# Patient Record
Sex: Female | Born: 1960
Health system: Southern US, Community
[De-identification: ages and names within clinical notes are randomized; demographics above are authoritative.]

## PROBLEM LIST (undated history)

## (undated) DIAGNOSIS — D751 Secondary polycythemia: Secondary | ICD-10-CM

## (undated) DIAGNOSIS — M1990 Unspecified osteoarthritis, unspecified site: Secondary | ICD-10-CM

## (undated) DIAGNOSIS — N3281 Overactive bladder: Secondary | ICD-10-CM

## (undated) DIAGNOSIS — D649 Anemia, unspecified: Secondary | ICD-10-CM

## (undated) DIAGNOSIS — Z8719 Personal history of other diseases of the digestive system: Secondary | ICD-10-CM

## (undated) DIAGNOSIS — K219 Gastro-esophageal reflux disease without esophagitis: Secondary | ICD-10-CM

## (undated) DIAGNOSIS — N289 Disorder of kidney and ureter, unspecified: Secondary | ICD-10-CM

## (undated) DIAGNOSIS — E119 Type 2 diabetes mellitus without complications: Secondary | ICD-10-CM

## (undated) DIAGNOSIS — Z87442 Personal history of urinary calculi: Secondary | ICD-10-CM

## (undated) DIAGNOSIS — I10 Essential (primary) hypertension: Secondary | ICD-10-CM

## (undated) DIAGNOSIS — R454 Irritability and anger: Secondary | ICD-10-CM

## (undated) DIAGNOSIS — J449 Chronic obstructive pulmonary disease, unspecified: Secondary | ICD-10-CM

## (undated) DIAGNOSIS — K759 Inflammatory liver disease, unspecified: Secondary | ICD-10-CM

## (undated) HISTORY — DX: Overactive bladder: N32.81

## (undated) HISTORY — PX: GALLBLADDER SURGERY: SHX652

## (undated) HISTORY — PX: CHOLECYSTECTOMY: SHX55

## (undated) HISTORY — DX: Essential (primary) hypertension: I10

## (undated) HISTORY — DX: Type 2 diabetes mellitus without complications: E11.9

---

## 2013-01-15 DIAGNOSIS — D751 Secondary polycythemia: Secondary | ICD-10-CM | POA: Insufficient documentation

## 2013-01-17 ENCOUNTER — Ambulatory Visit: Payer: Self-pay | Admitting: Hematology and Oncology

## 2013-02-12 ENCOUNTER — Ambulatory Visit: Payer: Self-pay | Admitting: Urgent Care

## 2013-03-02 ENCOUNTER — Ambulatory Visit: Payer: Self-pay | Admitting: Internal Medicine

## 2013-03-02 ENCOUNTER — Ambulatory Visit: Payer: Self-pay | Admitting: Hematology and Oncology

## 2013-03-02 LAB — CBC CANCER CENTER
Eosinophil #: 0.3 x10 3/mm (ref 0.0–0.7)
Eosinophil %: 2.7 %
HGB: 15.6 g/dL (ref 12.0–16.0)
Lymphocyte #: 2.6 x10 3/mm (ref 1.0–3.6)
MCH: 30.9 pg (ref 26.0–34.0)
MCHC: 34.5 g/dL (ref 32.0–36.0)
Monocyte %: 7.5 %
Neutrophil #: 5.8 x10 3/mm (ref 1.4–6.5)
Platelet: 281 x10 3/mm (ref 150–440)
RBC: 5.05 10*6/uL (ref 3.80–5.20)
RDW: 12.8 % (ref 11.5–14.5)
WBC: 9.4 x10 3/mm (ref 3.6–11.0)

## 2013-03-02 LAB — IRON AND TIBC
Iron Bind.Cap.(Total): 379 ug/dL (ref 250–450)
Unbound Iron-Bind.Cap.: 275 ug/dL

## 2013-03-02 LAB — FERRITIN: Ferritin (ARMC): 294 ng/mL (ref 8–388)

## 2013-03-12 LAB — CBC CANCER CENTER
Basophil #: 0.1 x10 3/mm (ref 0.0–0.1)
Eosinophil #: 0.3 x10 3/mm (ref 0.0–0.7)
HCT: 46.8 % (ref 35.0–47.0)
Lymphocyte %: 28 %
MCH: 31.3 pg (ref 26.0–34.0)
MCV: 90 fL (ref 80–100)
Monocyte #: 1.1 x10 3/mm — ABNORMAL HIGH (ref 0.2–0.9)
Monocyte %: 8.8 %
Neutrophil %: 60.4 %
WBC: 13 x10 3/mm — ABNORMAL HIGH (ref 3.6–11.0)

## 2013-03-19 ENCOUNTER — Ambulatory Visit: Payer: Self-pay | Admitting: Gastroenterology

## 2013-03-19 ENCOUNTER — Other Ambulatory Visit: Payer: Self-pay | Admitting: Family Medicine

## 2013-03-19 LAB — HEPATIC FUNCTION PANEL A (ARMC)
Bilirubin, Direct: 0.1 mg/dL (ref 0.00–0.20)
Bilirubin,Total: 0.3 mg/dL (ref 0.2–1.0)
SGPT (ALT): 55 U/L (ref 12–78)
Total Protein: 7.5 g/dL (ref 6.4–8.2)

## 2013-03-19 LAB — BASIC METABOLIC PANEL
Anion Gap: 4 — ABNORMAL LOW (ref 7–16)
BUN: 16 mg/dL (ref 7–18)
Calcium, Total: 8.8 mg/dL (ref 8.5–10.1)
Chloride: 102 mmol/L (ref 98–107)
Co2: 29 mmol/L (ref 21–32)
Creatinine: 0.63 mg/dL (ref 0.60–1.30)
EGFR (Non-African Amer.): >60
Osmolality: 279 (ref 275–301)
Potassium: 4.1 mmol/L (ref 3.5–5.1)
Sodium: 135 mmol/L — ABNORMAL LOW (ref 136–145)

## 2013-03-19 LAB — CANCER CENTER HEMATOCRIT: HCT: 42.5 % (ref 35.0–47.0)

## 2013-03-26 LAB — CANCER CENTER HEMATOCRIT: HCT: 41.7 % (ref 35.0–47.0)

## 2013-03-28 ENCOUNTER — Ambulatory Visit: Payer: Self-pay | Admitting: Internal Medicine

## 2013-03-28 ENCOUNTER — Ambulatory Visit: Payer: Self-pay | Admitting: Hematology and Oncology

## 2013-04-09 LAB — CANCER CENTER HEMATOCRIT: HCT: 45.3 % (ref 35.0–47.0)

## 2013-04-16 LAB — CBC CANCER CENTER
Basophil #: 0.1 x10 3/mm (ref 0.0–0.1)
Eosinophil #: 0.3 x10 3/mm (ref 0.0–0.7)
HGB: 15.5 g/dL (ref 12.0–16.0)
Lymphocyte #: 3.3 x10 3/mm (ref 1.0–3.6)
Lymphocyte %: 29.6 %
MCH: 31.2 pg (ref 26.0–34.0)
MCHC: 34.2 g/dL (ref 32.0–36.0)
MCV: 91 fL (ref 80–100)
Monocyte %: 5.8 %
Neutrophil #: 6.9 x10 3/mm — ABNORMAL HIGH (ref 1.4–6.5)
Neutrophil %: 61.3 %
RBC: 4.97 10*6/uL (ref 3.80–5.20)
WBC: 11.2 x10 3/mm — ABNORMAL HIGH (ref 3.6–11.0)

## 2013-04-28 ENCOUNTER — Ambulatory Visit: Payer: Self-pay | Admitting: Hematology and Oncology

## 2013-04-28 ENCOUNTER — Ambulatory Visit: Payer: Self-pay | Admitting: Family Medicine

## 2013-04-28 ENCOUNTER — Ambulatory Visit: Payer: Self-pay | Admitting: Internal Medicine

## 2013-05-07 LAB — CANCER CENTER HEMATOCRIT: HCT: 43 % (ref 35.0–47.0)

## 2013-05-28 ENCOUNTER — Ambulatory Visit: Payer: Self-pay | Admitting: Hematology and Oncology

## 2013-06-06 ENCOUNTER — Ambulatory Visit: Payer: Self-pay | Admitting: Gastroenterology

## 2013-06-06 LAB — HEPATIC FUNCTION PANEL A (ARMC)
Albumin: 3.1 g/dL — ABNORMAL LOW (ref 3.4–5.0)
Bilirubin,Total: 0.3 mg/dL (ref 0.2–1.0)
SGOT(AST): 19 U/L (ref 15–37)

## 2013-07-10 ENCOUNTER — Emergency Department: Payer: Self-pay | Admitting: Emergency Medicine

## 2013-07-16 ENCOUNTER — Ambulatory Visit: Payer: Self-pay | Admitting: Hematology and Oncology

## 2013-07-16 LAB — CANCER CENTER HEMATOCRIT: HCT: 45.3 % (ref 35.0–47.0)

## 2013-07-29 ENCOUNTER — Ambulatory Visit: Payer: Self-pay | Admitting: Hematology and Oncology

## 2013-07-29 ENCOUNTER — Ambulatory Visit: Payer: Self-pay | Admitting: Internal Medicine

## 2013-08-20 LAB — CBC CANCER CENTER
Basophil #: 0.1 x10 3/mm (ref 0.0–0.1)
Basophil %: 0.5 %
EOS ABS: 0.4 x10 3/mm (ref 0.0–0.7)
Eosinophil %: 3.3 %
HCT: 42.5 % (ref 35.0–47.0)
HGB: 13.9 g/dL (ref 12.0–16.0)
Lymphocyte #: 2.5 x10 3/mm (ref 1.0–3.6)
Lymphocyte %: 21.8 %
MCH: 29 pg (ref 26.0–34.0)
MCHC: 32.8 g/dL (ref 32.0–36.0)
MCV: 89 fL (ref 80–100)
MONO ABS: 0.8 x10 3/mm (ref 0.2–0.9)
Monocyte %: 7 %
NEUTROS ABS: 7.6 x10 3/mm — AB (ref 1.4–6.5)
NEUTROS PCT: 67.4 %
PLATELETS: 351 x10 3/mm (ref 150–440)
RBC: 4.8 10*6/uL (ref 3.80–5.20)
RDW: 13.4 % (ref 11.5–14.5)
WBC: 11.3 x10 3/mm — ABNORMAL HIGH (ref 3.6–11.0)

## 2013-08-26 ENCOUNTER — Ambulatory Visit: Payer: Self-pay | Admitting: Hematology and Oncology

## 2013-08-26 ENCOUNTER — Ambulatory Visit: Payer: Self-pay | Admitting: Family Medicine

## 2013-10-01 ENCOUNTER — Ambulatory Visit: Payer: Self-pay | Admitting: Internal Medicine

## 2013-10-01 LAB — CANCER CENTER HEMATOCRIT: HCT: 44.9 % (ref 35.0–47.0)

## 2013-10-26 ENCOUNTER — Ambulatory Visit: Payer: Self-pay | Admitting: Internal Medicine

## 2013-11-12 LAB — CANCER CENTER HEMATOCRIT: HCT: 41.7 % (ref 35.0–47.0)

## 2013-11-26 ENCOUNTER — Ambulatory Visit: Payer: Self-pay | Admitting: Internal Medicine

## 2013-12-24 LAB — CANCER CENTER HEMATOCRIT: HCT: 44 % (ref 35.0–47.0)

## 2013-12-26 ENCOUNTER — Ambulatory Visit: Payer: Self-pay | Admitting: Internal Medicine

## 2014-02-04 ENCOUNTER — Ambulatory Visit: Payer: Self-pay | Admitting: Internal Medicine

## 2014-02-07 LAB — CANCER CENTER HEMATOCRIT: HCT: 48.1 % — AB (ref 35.0–47.0)

## 2014-02-26 ENCOUNTER — Ambulatory Visit: Payer: Self-pay | Admitting: Internal Medicine

## 2014-04-23 ENCOUNTER — Emergency Department: Payer: Self-pay | Admitting: Emergency Medicine

## 2014-04-23 LAB — CBC
HCT: 50.9 % — AB (ref 35.0–47.0)
HGB: 16.9 g/dL — AB (ref 12.0–16.0)
MCH: 29.4 pg (ref 26.0–34.0)
MCHC: 33.2 g/dL (ref 32.0–36.0)
MCV: 89 fL (ref 80–100)
Platelet: 421 10*3/uL (ref 150–440)
RBC: 5.74 10*6/uL — ABNORMAL HIGH (ref 3.80–5.20)
RDW: 13.3 % (ref 11.5–14.5)
WBC: 15.5 10*3/uL — ABNORMAL HIGH (ref 3.6–11.0)

## 2014-04-23 LAB — URINALYSIS, COMPLETE
Bacteria: NONE SEEN
Bilirubin,UR: NEGATIVE
Blood: NEGATIVE
GLUCOSE, UR: NEGATIVE mg/dL (ref 0–75)
KETONE: NEGATIVE
LEUKOCYTE ESTERASE: NEGATIVE
Nitrite: POSITIVE
PROTEIN: NEGATIVE
Ph: 6 (ref 4.5–8.0)
RBC,UR: 10 /HPF (ref 0–5)
Specific Gravity: 1.046 (ref 1.003–1.030)
Squamous Epithelial: 5

## 2014-04-23 LAB — COMPREHENSIVE METABOLIC PANEL
ALBUMIN: 3.4 g/dL (ref 3.4–5.0)
ALT: 22 U/L
AST: 20 U/L (ref 15–37)
Alkaline Phosphatase: 167 U/L — ABNORMAL HIGH
Anion Gap: 9 (ref 7–16)
BUN: 11 mg/dL (ref 7–18)
Bilirubin,Total: 0.7 mg/dL (ref 0.2–1.0)
CALCIUM: 9.3 mg/dL (ref 8.5–10.1)
CHLORIDE: 102 mmol/L (ref 98–107)
CO2: 29 mmol/L (ref 21–32)
Creatinine: 0.86 mg/dL (ref 0.60–1.30)
EGFR (African American): >60
Glucose: 176 mg/dL — ABNORMAL HIGH (ref 65–99)
OSMOLALITY: 283 (ref 275–301)
Potassium: 4 mmol/L (ref 3.5–5.1)
Sodium: 140 mmol/L (ref 136–145)
TOTAL PROTEIN: 8.3 g/dL — AB (ref 6.4–8.2)

## 2014-04-26 LAB — URINE CULTURE

## 2014-05-10 ENCOUNTER — Ambulatory Visit: Payer: Self-pay | Admitting: Internal Medicine

## 2014-05-10 LAB — CBC CANCER CENTER
Basophil #: 0 x10 3/mm (ref 0.0–0.1)
Basophil %: 0.4 %
EOS PCT: 1.7 %
Eosinophil #: 0.2 x10 3/mm (ref 0.0–0.7)
HCT: 44.8 % (ref 35.0–47.0)
HGB: 15.2 g/dL (ref 12.0–16.0)
Lymphocyte #: 2.7 x10 3/mm (ref 1.0–3.6)
Lymphocyte %: 24.4 %
MCH: 29.9 pg (ref 26.0–34.0)
MCHC: 34 g/dL (ref 32.0–36.0)
MCV: 88 fL (ref 80–100)
MONO ABS: 0.8 x10 3/mm (ref 0.2–0.9)
Monocyte %: 6.9 %
Neutrophil #: 7.3 x10 3/mm — ABNORMAL HIGH (ref 1.4–6.5)
Neutrophil %: 66.6 %
Platelet: 376 x10 3/mm (ref 150–440)
RBC: 5.08 10*6/uL (ref 3.80–5.20)
RDW: 13.3 % (ref 11.5–14.5)
WBC: 11 x10 3/mm (ref 3.6–11.0)

## 2014-05-28 ENCOUNTER — Ambulatory Visit: Payer: Self-pay | Admitting: Internal Medicine

## 2014-07-05 ENCOUNTER — Ambulatory Visit: Payer: Self-pay | Admitting: Internal Medicine

## 2014-07-05 DIAGNOSIS — D751 Secondary polycythemia: Secondary | ICD-10-CM | POA: Diagnosis not present

## 2014-07-05 LAB — CANCER CENTER HEMATOCRIT: HCT: 45.7 % (ref 35.0–47.0)

## 2014-07-10 DIAGNOSIS — R350 Frequency of micturition: Secondary | ICD-10-CM | POA: Diagnosis not present

## 2014-07-29 ENCOUNTER — Ambulatory Visit: Payer: Self-pay | Admitting: Internal Medicine

## 2014-08-30 ENCOUNTER — Ambulatory Visit
Admit: 2014-08-30 | Disposition: A | Discharge status: Home / Self Care | Payer: Self-pay | Attending: Internal Medicine | Admitting: Internal Medicine

## 2014-08-30 DIAGNOSIS — D751 Secondary polycythemia: Secondary | ICD-10-CM | POA: Diagnosis not present

## 2014-09-27 ENCOUNTER — Ambulatory Visit
Admit: 2014-09-27 | Disposition: A | Discharge status: Home / Self Care | Payer: Self-pay | Attending: Internal Medicine | Admitting: Internal Medicine

## 2014-10-25 DIAGNOSIS — D751 Secondary polycythemia: Secondary | ICD-10-CM | POA: Diagnosis not present

## 2014-10-25 LAB — CANCER CENTER HEMATOCRIT: HCT: 48.3 % — ABNORMAL HIGH (ref 35.0–47.0)

## 2014-11-14 DIAGNOSIS — K529 Noninfective gastroenteritis and colitis, unspecified: Secondary | ICD-10-CM | POA: Diagnosis not present

## 2014-11-14 DIAGNOSIS — I10 Essential (primary) hypertension: Secondary | ICD-10-CM | POA: Diagnosis not present

## 2014-12-02 ENCOUNTER — Ambulatory Visit: Payer: Commercial Managed Care - HMO | Admitting: Gastroenterology

## 2014-12-02 ENCOUNTER — Other Ambulatory Visit: Payer: Self-pay

## 2014-12-02 ENCOUNTER — Telehealth: Payer: Self-pay

## 2014-12-02 ENCOUNTER — Encounter: Payer: Self-pay | Admitting: Gastroenterology

## 2014-12-02 VITALS — BP 138/78 | HR 89 | Temp 98.3°F | Ht <= 58 in | Wt 203.0 lb

## 2014-12-02 DIAGNOSIS — F32A Depression, unspecified: Secondary | ICD-10-CM | POA: Insufficient documentation

## 2014-12-02 DIAGNOSIS — E119 Type 2 diabetes mellitus without complications: Secondary | ICD-10-CM | POA: Insufficient documentation

## 2014-12-02 DIAGNOSIS — J449 Chronic obstructive pulmonary disease, unspecified: Secondary | ICD-10-CM | POA: Insufficient documentation

## 2014-12-02 DIAGNOSIS — Z8679 Personal history of other diseases of the circulatory system: Secondary | ICD-10-CM | POA: Insufficient documentation

## 2014-12-02 DIAGNOSIS — R197 Diarrhea, unspecified: Secondary | ICD-10-CM | POA: Diagnosis not present

## 2014-12-02 DIAGNOSIS — B182 Chronic viral hepatitis C: Secondary | ICD-10-CM

## 2014-12-02 DIAGNOSIS — Z8739 Personal history of other diseases of the musculoskeletal system and connective tissue: Secondary | ICD-10-CM | POA: Insufficient documentation

## 2014-12-02 DIAGNOSIS — F329 Major depressive disorder, single episode, unspecified: Secondary | ICD-10-CM | POA: Insufficient documentation

## 2014-12-02 DIAGNOSIS — Z794 Long term (current) use of insulin: Secondary | ICD-10-CM

## 2014-12-02 DIAGNOSIS — M549 Dorsalgia, unspecified: Secondary | ICD-10-CM | POA: Insufficient documentation

## 2014-12-02 DIAGNOSIS — J45909 Unspecified asthma, uncomplicated: Secondary | ICD-10-CM | POA: Insufficient documentation

## 2014-12-02 DIAGNOSIS — K2270 Barrett's esophagus without dysplasia: Secondary | ICD-10-CM | POA: Insufficient documentation

## 2014-12-02 MED ORDER — LOSARTAN POTASSIUM 100 MG PO TABS: 100.0000 mg | ORAL_TABLET | Freq: Every day | ORAL | Status: DC

## 2014-12-02 NOTE — Telephone Encounter (Signed)
Dr. Manuella Ghazi signed and approved this refill on a printed paper rx.  I sent it through system.

## 2014-12-02 NOTE — Progress Notes (Signed)
   Primary Care Physician: Geri Seminole, MD  Primary Gastroenterologist:  Dr. Lucilla Lame  Chief Complaint  Patient presents with  . Abdominal Pain  . Diarrhea  . Vomiting    HPI: Tara Cochran is a 54 y.o. female here for diarrhea. The patient reports the diarrhea has been present for the last 8 months with a 30 pound weight loss in the last 2 months. Ninth any report of black or bloody stools. She states that she also has nausea and feels that her food is rushing through her quickly and she has a bathroom usually 15 minutes after eating. She has a lot of nausea when she bends over.  Current Outpatient Prescriptions  Medication Sig Dispense Refill  . ACCU-CHEK SMARTVIEW test strip as directed.    Marland Kitchen FLUoxetine (PROZAC) 40 MG capsule 40 mg daily.    . Incontinence Supply Disposable (BLADDER CONTROL PADS EX ABSORB) MISC daily.    . insulin lispro protamine-lispro (HUMALOG 75/25 MIX) (75-25) 100 UNIT/ML SUSP injection Inject 30 mLs into the skin 2 (two) times daily.    Marland Kitchen losartan (COZAAR) 100 MG tablet Take 1 tablet (100 mg total) by mouth daily. 30 tablet 0  . Multiple Vitamins-Minerals (WOMENS MULTIVITAMIN PLUS PO) daily.    Marland Kitchen gabapentin (NEURONTIN) 300 MG capsule Take 300 mg by mouth 3 (three) times daily.    . insulin lispro (HUMALOG) 100 UNIT/ML injection Inject 100 Units into the skin daily.     No current facility-administered medications for this visit.    Allergies as of 12/02/2014 - Review Complete 12/02/2014  Allergen Reaction Noted  . Morphine sulfate Itching 12/02/2014    ROS:  General: Negative for anorexia, weight loss, fever, chills, fatigue, weakness. ENT: Negative for hoarseness, difficulty swallowing , nasal congestion. CV: Negative for chest pain, angina, palpitations, dyspnea on exertion, peripheral edema.  Respiratory: Negative for dyspnea at rest, dyspnea on exertion, cough, sputum, wheezing.  GI: See history of present illness. GU:  Negative for  dysuria, hematuria, urinary incontinence, urinary frequency, nocturnal urination.  Endo: Negative for unusual weight change.    Physical Examination:   BP 138/78 mmHg  Pulse 89  Temp(Src) 98.3 F (36.8 C) (Oral)  Ht 4\' 10"  (1.473 m)  Wt 203 lb (92.08 kg)  BMI 42.44 kg/m2  General: Well-nourished, well-developed in no acute distress.  Eyes: No icterus. Conjunctivae pink. Mouth: Oropharyngeal mucosa moist and pink , no lesions erythema or exudate. Lungs: Clear to auscultation bilaterally. Non-labored. Heart: Regular rate and rhythm, no murmurs rubs or gallops.  Abdomen: Bowel sounds are normal, nontender, nondistended, no hepatosplenomegaly or masses, no abdominal bruits or hernia , no rebound or guarding.   Extremities: No lower extremity edema. No clubbing or deformities. Neuro: Alert and oriented x 3.  Grossly intact. Skin: Warm and dry, no jaundice.   Psych: Alert and cooperative, normal mood and affect.    Imaging Studies: No results found.

## 2014-12-02 NOTE — Assessment & Plan Note (Signed)
This patient is a 54 year old woman who comes in today with diarrhea for the last 8 months. The patient has also had a 30 pound weight loss. The patient has not had any rectal bleeding. The patient's last colonoscopy was in 2013. The patient also reports that she has problems with nausea. The patient will be set up for an EGD and colonoscopy. He has been explained the plan and agrees with it.

## 2014-12-03 LAB — CBC WITH DIFFERENTIAL/PLATELET
BASOS ABS: 0 10*3/uL (ref 0.0–0.2)
Basos: 0 %
EOS (ABSOLUTE): 0.3 10*3/uL (ref 0.0–0.4)
EOS: 3 %
HEMATOCRIT: 45.1 % (ref 34.0–46.6)
HEMOGLOBIN: 14.7 g/dL (ref 11.1–15.9)
IMMATURE GRANS (ABS): 0 10*3/uL (ref 0.0–0.1)
IMMATURE GRANULOCYTES: 0 %
LYMPHS ABS: 2.4 10*3/uL (ref 0.7–3.1)
Lymphs: 25 %
MCH: 29.3 pg (ref 26.6–33.0)
MCHC: 32.6 g/dL (ref 31.5–35.7)
MCV: 90 fL (ref 79–97)
MONOS ABS: 0.8 10*3/uL (ref 0.1–0.9)
Monocytes: 8 %
NEUTROS PCT: 64 %
Neutrophils Absolute: 6.3 10*3/uL (ref 1.4–7.0)
Platelets: 321 10*3/uL (ref 150–379)
RBC: 5.01 x10E6/uL (ref 3.77–5.28)
RDW: 13.8 % (ref 12.3–15.4)
WBC: 9.9 10*3/uL (ref 3.4–10.8)

## 2014-12-03 LAB — TISSUE TRANSGLUTAMINASE, IGA: Transglutaminase IgA: <2 U/mL (ref 0–3)

## 2014-12-03 LAB — IGA: IgA/Immunoglobulin A, Serum: 337 mg/dL (ref 87–352)

## 2014-12-06 ENCOUNTER — Encounter: Payer: Self-pay | Admitting: *Deleted

## 2014-12-11 ENCOUNTER — Telehealth: Payer: Self-pay

## 2014-12-11 NOTE — Telephone Encounter (Signed)
Pt notified of results

## 2014-12-11 NOTE — Telephone Encounter (Signed)
-----   Message from Lucilla Lame, MD sent at 12/09/2014  8:39 AM EDT ----- Let the patient know the Sprue test was normal.

## 2014-12-11 NOTE — Telephone Encounter (Signed)
-----   Message from Lucilla Lame, MD sent at 12/03/2014 12:57 PM EDT ----- Let the patient know the blood work was normal and we are still awaiting the results of the stool tests.

## 2014-12-11 NOTE — Discharge Instructions (Signed)

## 2014-12-11 NOTE — Telephone Encounter (Signed)
Pt notified of results. Pt stated she hasn't done the stool test yet, but will do it soon.

## 2014-12-12 ENCOUNTER — Encounter
Admission: RE | Disposition: A | Discharge status: Home / Self Care | Payer: Self-pay | Source: Ambulatory Visit | Attending: Gastroenterology

## 2014-12-12 ENCOUNTER — Ambulatory Visit: Payer: Commercial Managed Care - HMO | Admitting: Anesthesiology

## 2014-12-12 ENCOUNTER — Other Ambulatory Visit: Payer: Self-pay | Admitting: Gastroenterology

## 2014-12-12 ENCOUNTER — Ambulatory Visit
Admission: RE | Admit: 2014-12-12 | Discharge: 2014-12-12 | Disposition: A | Discharge status: Home / Self Care | Payer: Commercial Managed Care - HMO | Source: Ambulatory Visit | Attending: Gastroenterology | Admitting: Gastroenterology

## 2014-12-12 DIAGNOSIS — F17210 Nicotine dependence, cigarettes, uncomplicated: Secondary | ICD-10-CM | POA: Insufficient documentation

## 2014-12-12 DIAGNOSIS — K449 Diaphragmatic hernia without obstruction or gangrene: Secondary | ICD-10-CM | POA: Insufficient documentation

## 2014-12-12 DIAGNOSIS — K640 First degree hemorrhoids: Secondary | ICD-10-CM | POA: Diagnosis not present

## 2014-12-12 DIAGNOSIS — D128 Benign neoplasm of rectum: Secondary | ICD-10-CM | POA: Insufficient documentation

## 2014-12-12 DIAGNOSIS — R12 Heartburn: Secondary | ICD-10-CM | POA: Insufficient documentation

## 2014-12-12 DIAGNOSIS — Z794 Long term (current) use of insulin: Secondary | ICD-10-CM | POA: Insufficient documentation

## 2014-12-12 DIAGNOSIS — R1011 Right upper quadrant pain: Secondary | ICD-10-CM

## 2014-12-12 DIAGNOSIS — R634 Abnormal weight loss: Secondary | ICD-10-CM | POA: Insufficient documentation

## 2014-12-12 DIAGNOSIS — K2980 Duodenitis without bleeding: Secondary | ICD-10-CM | POA: Insufficient documentation

## 2014-12-12 DIAGNOSIS — K2100 Gastro-esophageal reflux disease with esophagitis, without bleeding: Secondary | ICD-10-CM | POA: Insufficient documentation

## 2014-12-12 DIAGNOSIS — Z79899 Other long term (current) drug therapy: Secondary | ICD-10-CM | POA: Diagnosis not present

## 2014-12-12 DIAGNOSIS — K621 Rectal polyp: Secondary | ICD-10-CM | POA: Insufficient documentation

## 2014-12-12 DIAGNOSIS — K2950 Unspecified chronic gastritis without bleeding: Secondary | ICD-10-CM | POA: Insufficient documentation

## 2014-12-12 DIAGNOSIS — I10 Essential (primary) hypertension: Secondary | ICD-10-CM | POA: Insufficient documentation

## 2014-12-12 DIAGNOSIS — K2970 Gastritis, unspecified, without bleeding: Secondary | ICD-10-CM

## 2014-12-12 DIAGNOSIS — K2971 Gastritis, unspecified, with bleeding: Secondary | ICD-10-CM | POA: Diagnosis not present

## 2014-12-12 DIAGNOSIS — Z885 Allergy status to narcotic agent status: Secondary | ICD-10-CM | POA: Insufficient documentation

## 2014-12-12 DIAGNOSIS — R110 Nausea: Secondary | ICD-10-CM | POA: Insufficient documentation

## 2014-12-12 DIAGNOSIS — R197 Diarrhea, unspecified: Secondary | ICD-10-CM | POA: Diagnosis not present

## 2014-12-12 DIAGNOSIS — E119 Type 2 diabetes mellitus without complications: Secondary | ICD-10-CM | POA: Diagnosis not present

## 2014-12-12 DIAGNOSIS — K529 Noninfective gastroenteritis and colitis, unspecified: Secondary | ICD-10-CM | POA: Diagnosis not present

## 2014-12-12 HISTORY — PX: POLYPECTOMY: SHX5525

## 2014-12-12 HISTORY — PX: COLONOSCOPY: SHX5424

## 2014-12-12 HISTORY — PX: ESOPHAGOGASTRODUODENOSCOPY: SHX5428

## 2014-12-12 HISTORY — DX: Secondary polycythemia: D75.1

## 2014-12-12 LAB — GLUCOSE, CAPILLARY
GLUCOSE-CAPILLARY: 190 mg/dL — AB (ref 65–99)
Glucose-Capillary: 183 mg/dL — ABNORMAL HIGH (ref 65–99)

## 2014-12-12 SURGERY — COLONOSCOPY
Anesthesia: Monitor Anesthesia Care | Wound class: Contaminated

## 2014-12-12 MED ORDER — STERILE WATER FOR IRRIGATION IR SOLN
Status: DC | PRN
  Administered 2014-12-12: 11:00:00

## 2014-12-12 MED ORDER — PROPOFOL 10 MG/ML IV BOLUS
INTRAVENOUS | Status: DC | PRN
  Administered 2014-12-12: 50 mg via INTRAVENOUS
  Administered 2014-12-12: 75 mg via INTRAVENOUS
  Administered 2014-12-12 (×3): 50 mg via INTRAVENOUS
  Administered 2014-12-12: 25 mg via INTRAVENOUS
  Administered 2014-12-12: 50 mg via INTRAVENOUS
  Administered 2014-12-12: 100 mg via INTRAVENOUS

## 2014-12-12 MED ORDER — LACTATED RINGERS IV SOLN
INTRAVENOUS | Status: DC
  Administered 2014-12-12: 10:00:00 via INTRAVENOUS

## 2014-12-12 MED ORDER — LIDOCAINE HCL (CARDIAC) 20 MG/ML IV SOLN
INTRAVENOUS | Status: DC | PRN
  Administered 2014-12-12: 40 mg via INTRAVENOUS

## 2014-12-12 MED ORDER — PANTOPRAZOLE SODIUM 40 MG PO TBEC: 40.0000 mg | DELAYED_RELEASE_TABLET | Freq: Every day | ORAL | Status: DC

## 2014-12-12 MED ORDER — GLYCOPYRROLATE 0.2 MG/ML IJ SOLN
INTRAMUSCULAR | Status: DC | PRN
  Administered 2014-12-12: 0.2 mg via INTRAVENOUS

## 2014-12-12 SURGICAL SUPPLY — 39 items
BALLN DILATOR 10-12 8 (BALLOONS)
BALLN DILATOR 12-15 8 (BALLOONS)
BALLN DILATOR 15-18 8 (BALLOONS)
BALLN DILATOR CRE 0-12 8 (BALLOONS)
BALLN DILATOR ESOPH 8 10 CRE (MISCELLANEOUS) IMPLANT
BALLOON DILATOR 12-15 8 (BALLOONS) IMPLANT
BALLOON DILATOR 15-18 8 (BALLOONS) IMPLANT
BALLOON DILATOR CRE 0-12 8 (BALLOONS) IMPLANT
BLOCK BITE 60FR ADLT L/F GRN (MISCELLANEOUS) ×4 IMPLANT
CANISTER SUCT 1200ML W/VALVE (MISCELLANEOUS) ×4 IMPLANT
FCP ESCP3.2XJMB 240X2.8X (MISCELLANEOUS)
FORCEPS BIOP RAD 4 LRG CAP 4 (CUTTING FORCEPS) ×4 IMPLANT
FORCEPS BIOP RJ4 240 W/NDL (MISCELLANEOUS)
FORCEPS ESCP3.2XJMB 240X2.8X (MISCELLANEOUS) IMPLANT
GOWN CVR UNV OPN BCK APRN NK (MISCELLANEOUS) ×4 IMPLANT
GOWN ISOL THUMB LOOP REG UNIV (MISCELLANEOUS) ×4
HEMOCLIP INSTINCT (CLIP) IMPLANT
INJECTOR VARIJECT VIN23 (MISCELLANEOUS) IMPLANT
KIT CO2 TUBING (TUBING) IMPLANT
KIT DEFENDO VALVE AND CONN (KITS) IMPLANT
KIT ENDO PROCEDURE OLY (KITS) ×4 IMPLANT
LIGATOR MULTIBAND 6SHOOTER MBL (MISCELLANEOUS) IMPLANT
MARKER SPOT ENDO TATTOO 5ML (MISCELLANEOUS) IMPLANT
PAD GROUND ADULT SPLIT (MISCELLANEOUS) IMPLANT
SNARE SHORT THROW 13M SML OVAL (MISCELLANEOUS) IMPLANT
SNARE SHORT THROW 30M LRG OVAL (MISCELLANEOUS) IMPLANT
SPOT EX ENDOSCOPIC TATTOO (MISCELLANEOUS)
SUCTION POLY TRAP 4CHAMBER (MISCELLANEOUS) IMPLANT
SYR INFLATION 60ML (SYRINGE) IMPLANT
TRAP SUCTION POLY (MISCELLANEOUS) IMPLANT
TUBING CONN 6MMX3.1M (TUBING)
TUBING SUCTION CONN 0.25 STRL (TUBING) IMPLANT
UNDERPAD 30X60 958B10 (PK) (MISCELLANEOUS) IMPLANT
VALVE BIOPSY ENDO (VALVE) IMPLANT
VARIJECT INJECTOR VIN23 (MISCELLANEOUS)
WATER AUXILLARY (MISCELLANEOUS) IMPLANT
WATER STERILE IRR 250ML POUR (IV SOLUTION) ×4 IMPLANT
WATER STERILE IRR 500ML POUR (IV SOLUTION) IMPLANT
WIRE CRE 18-20MM 8CM F G (MISCELLANEOUS) IMPLANT

## 2014-12-12 NOTE — Op Note (Signed)
[facility] Gastroenterology Patient Name: Tara Cochran Procedure Date: 12/12/2014 11:06 AM MRN: [id number] Account #: [id number] Date of Birth: [birth year] Admit Type: Outpatient Age: 54 Room: [facility] OR ROOM 01 Gender: Female Note Status: Finalized Procedure:         Upper GI endoscopy Indications:       Heartburn, Diarrhea, Nausea Providers:         Lucilla Lame, MD Medicines:         Propofol per Anesthesia Complications:     No immediate complications. Procedure:         Pre-Anesthesia Assessment:                    - Prior to the procedure, a History and Physical was                     performed, and patient medications and allergies were                     reviewed. The patient's tolerance of previous anesthesia                     was also reviewed. The risks and benefits of the procedure                     and the sedation options and risks were discussed with the                     patient. All questions were answered, and informed consent                     was obtained. Prior Anticoagulants: The patient has taken                     no previous anticoagulant or antiplatelet agents. ASA                     Grade Assessment: II - A patient with mild systemic                     disease. After reviewing the risks and benefits, the                     patient was deemed in satisfactory condition to undergo                     the procedure.                    After obtaining informed consent, the endoscope was passed                     under direct vision. Throughout the procedure, the                     patient's blood pressure, pulse, and oxygen saturations                     were monitored continuously. The Olympus [id number]-[id number]                     Endoscope (S#. [id number]) was introduced through the mouth,                     and advanced to the second part of duodenum. The  upper GI                     endoscopy was accomplished without difficulty.  The patient                     tolerated the procedure well. Findings:      LA Grade A (one or more mucosal breaks less than 5 mm, not extending       between tops of 2 mucosal folds) esophagitis with no bleeding was found       in the lower third of the esophagus. Biopsies were taken with a cold       forceps for histology.      A small hiatus hernia was present.      Diffuse moderate inflammation with hemorrhage characterized by erosions       was found in the entire examined stomach. Biopsies were taken with a       cold forceps for histology.      Localized mild inflammation characterized by erythema was found in the       duodenal bulb. Biopsies were taken with a cold forceps for histology. Impression:        - LA Grade A reflux esophagitis. Biopsied.                    - Small hiatus hernia.                    - Gastritis with hemorrhage. Biopsied.                    - Duodenitis. Biopsied. Recommendation:    - Await pathology results.                    - Perform a colonoscopy today. Procedure Code(s): --- Professional ---                    [phone], Esophagogastroduodenoscopy, flexible, transoral;                     with biopsy, single or multiple Diagnosis Code(s): --- Professional ---                    R11.0, Nausea                    R19.7, Diarrhea, unspecified                    K29.80, Duodenitis without bleeding                    K29.71, Gastritis, unspecified, with bleeding                    K21.0, Gastro-esophageal reflux disease with esophagitis CPT copyright 2014 American Medical Association. All rights reserved. The codes documented in this report are preliminary and upon coder review may  be revised to meet current compliance requirements. Lucilla Lame, MD 12/12/2014 11:17:37 AM This report has been signed electronically. Number of Addenda: 0 Note Initiated On: 12/12/2014 11:06 AM      [facility]

## 2014-12-12 NOTE — Anesthesia Postprocedure Evaluation (Signed)
  Anesthesia Post-op Note  Patient: Tara Cochran  Procedure(s) Performed: Procedure(s): COLONOSCOPY (N/A) ESOPHAGOGASTRODUODENOSCOPY (EGD) (N/A) POLYPECTOMY  Anesthesia type:MAC  Patient location: PACU  Post pain: Pain level controlled  Post assessment: Post-op Vital signs reviewed, Patient's Cardiovascular Status Stable, Respiratory Function Stable, Patent Airway and No signs of Nausea or vomiting  Post vital signs: Reviewed and stable  Last Vitals:  Filed Vitals:   12/12/14 1145  BP: 90/50  Pulse: 79  Temp:   Resp: 21    Level of consciousness: awake, alert  and patient cooperative  Complications: No apparent anesthesia complications

## 2014-12-12 NOTE — Transfer of Care (Signed)
Immediate Anesthesia Transfer of Care Note  Patient: Tara Cochran  Procedure(s) Performed: Procedure(s): COLONOSCOPY (N/A) ESOPHAGOGASTRODUODENOSCOPY (EGD) (N/A) POLYPECTOMY  Patient Location: PACU  Anesthesia Type: MAC  Level of Consciousness: awake, alert  and patient cooperative  Airway and Oxygen Therapy: Patient Spontanous Breathing and Patient connected to supplemental oxygen  Post-op Assessment: Post-op Vital signs reviewed, Patient's Cardiovascular Status Stable, Respiratory Function Stable, Patent Airway and No signs of Nausea or vomiting  Post-op Vital Signs: Reviewed and stable  Complications: No apparent anesthesia complications

## 2014-12-12 NOTE — H&P (Signed)
[facility]  [address], [place],  [address] Phone: [phone] Fax : [phone]  Primary Care Physician:  Geri Seminole, MD Primary Gastroenterologist:  Dr. Allen Norris  Pre-Procedure History & Physical: HPI:  Tara Cochran is a 54 y.o. female is here for an endoscopy and colonoscopy.   Past Medical History  Diagnosis Date  . Diabetes mellitus without complication   . Hypertension   . Polycythemia     Past Surgical History  Procedure Laterality Date  . Cholecystectomy    . Cesarean section      Prior to Admission medications   Medication Sig Start Date End Date Taking? Authorizing Provider  ACCU-CHEK SMARTVIEW test strip as directed. 11/06/14  Yes Historical Provider, MD  FLUoxetine (PROZAC) 40 MG capsule 40 mg daily. 11/13/14  Yes Historical Provider, MD  insulin lispro protamine-lispro (HUMALOG 75/25 MIX) (75-25) 100 UNIT/ML SUSP injection Inject 60 Units into the skin 2 (two) times daily.    Yes Historical Provider, MD  losartan (COZAAR) 100 MG tablet Take 1 tablet (100 mg total) by mouth daily. 12/02/14  Yes Roselee Nova, MD  Multiple Vitamins-Minerals (WOMENS MULTIVITAMIN PLUS PO) daily. 08/27/14  Yes Historical Provider, MD  gabapentin (NEURONTIN) 300 MG capsule Take 300 mg by mouth 3 (three) times daily.    Historical Provider, MD  Incontinence Supply Disposable (BLADDER CONTROL PADS EX ABSORB) MISC daily. 08/27/14   Historical Provider, MD  insulin lispro (HUMALOG) 100 UNIT/ML injection Inject 100 Units into the skin daily.    Historical Provider, MD    Allergies as of 12/06/2014 - Review Complete 12/06/2014  Allergen Reaction Noted  . Morphine sulfate Itching 12/02/2014    Family History  Problem Relation Age of Onset  . Stroke Mother   . Hypertension Mother   . Hypertension Father   . Stroke Father   . Heart disease Sister     History   Social History  . Marital Status: Divorced    Spouse Name: N/A  . Number of Children:  N/A  . Years of Education: N/A   Occupational History  . Not on file.   Social History Main Topics  . Smoking status: Current Every Day Smoker -- 0.50 packs/day    Types: Cigarettes  . Smokeless tobacco: Never Used  . Alcohol Use: 0.6 oz/week    1 Glasses of wine per week  . Drug Use: 2.00 per week    Special: Marijuana  . Sexual Activity: Not on file   Other Topics Concern  . Not on file   Social History Narrative    Review of Systems: See HPI, otherwise negative ROS  Physical Exam: BP 159/90 mmHg  Pulse 86  Temp(Src) 97.9 F (36.6 C) (Temporal)  Resp 16  Ht 4\' 10"  (1.473 m)  Wt 196 lb (88.905 kg)  BMI 40.98 kg/m2  SpO2 100% General:   Alert,  pleasant and cooperative in NAD Head:  Normocephalic and atraumatic. Neck:  Supple; no masses or thyromegaly. Lungs:  Clear throughout to auscultation.    Heart:  Regular rate and rhythm. Abdomen:  Soft, nontender and nondistended. Normal bowel sounds, without guarding, and without rebound.   Neurologic:  Alert and  oriented x4;  grossly normal neurologically.  Impression/Plan: Tara Cochran is here for an endoscopy and colonoscopy to be performed for weight loss diarrhea and nausea.  Risks, benefits, limitations, and alternatives regarding  endoscopy and colonoscopy have been reviewed with the patient.  Questions have been answered.  All parties agreeable.   Ollen Bowl, MD  12/12/2014, 10:37 AM

## 2014-12-12 NOTE — Anesthesia Procedure Notes (Signed)
Procedure Name: MAC Performed by: Ashrita Chrismer Pre-anesthesia Checklist: Patient identified, Emergency Drugs available, Suction available, Timeout performed and Patient being monitored Patient Re-evaluated:Patient Re-evaluated prior to inductionOxygen Delivery Method: Nasal cannula Placement Confirmation: positive ETCO2     

## 2014-12-12 NOTE — Op Note (Signed)
[facility] Gastroenterology Patient Name: Tara Cochran Procedure Date: 12/12/2014 11:19 AM MRN: [id number] Account #: [id number] Date of Birth: April 20, 1961 Admit Type: Outpatient Age: 54 Room: [facility] OR ROOM 01 Gender: Female Note Status: Finalized Procedure:         Colonoscopy Indications:       Chronic diarrhea, Weight loss Providers:         Lucilla Lame, MD Medicines:         Propofol per Anesthesia Complications:     No immediate complications. Procedure:         Pre-Anesthesia Assessment:                    - Prior to the procedure, a History and Physical was                     performed, and patient medications and allergies were                     reviewed. The patient's tolerance of previous anesthesia                     was also reviewed. The risks and benefits of the procedure                     and the sedation options and risks were discussed with the                     patient. All questions were answered, and informed consent                     was obtained. Prior Anticoagulants: The patient has taken                     no previous anticoagulant or antiplatelet agents. ASA                     Grade Assessment: II - A patient with mild systemic                     disease. After reviewing the risks and benefits, the                     patient was deemed in satisfactory condition to undergo                     the procedure.                    After obtaining informed consent, the colonoscope was                     passed under direct vision. Throughout the procedure, the                     patient's blood pressure, pulse, and oxygen saturations                     were monitored continuously. The was introduced through                     the anus and advanced to the the terminal ileum. The                     colonoscopy was performed without difficulty. The patient  tolerated the procedure well. The quality of the  bowel                     preparation was excellent. Findings:      The perianal and digital rectal examinations were normal.      A 4 mm polyp was found in the rectum. The polyp was sessile. The polyp       was removed with a cold biopsy forceps. Resection and retrieval were       complete.      The terminal ileum appeared normal. Biopsies were taken with a cold       forceps for histology.      Random biopsies were obtained with cold forceps for histology randomly       in the entire colon.      Non-bleeding internal hemorrhoids were found during retroflexion. The       hemorrhoids were Grade I (internal hemorrhoids that do not prolapse). Impression:        - One 4 mm polyp in the rectum. Resected and retrieved.                    - The examined portion of the ileum was normal. Biopsied.                    - Non-bleeding internal hemorrhoids.                    - Random biopsies were obtained in the entire colon. Recommendation:    - Await pathology results.                    - Repeat colonoscopy in 5 years if polyp adenoma and 10                     years if hyperplastic Procedure Code(s): --- Professional ---                    [phone], Colonoscopy, flexible; with biopsy, single or                     multiple Diagnosis Code(s): --- Professional ---                    K52.9, Noninfective gastroenteritis and colitis,                     unspecified                    R63.4, Abnormal weight loss                    K62.1, Rectal polyp CPT copyright 2014 American Medical Association. All rights reserved. The codes documented in this report are preliminary and upon coder review may  be revised to meet current compliance requirements. Lucilla Lame, MD 12/12/2014 11:34:16 AM This report has been signed electronically. Number of Addenda: 0 Note Initiated On: 12/12/2014 11:19 AM Scope Withdrawal Time: 0 hours 9 minutes 2 seconds  Total Procedure Duration: 0 hours 11 minutes 16 seconds        [facility]

## 2014-12-12 NOTE — Anesthesia Preprocedure Evaluation (Addendum)
Anesthesia Evaluation  Patient identified by MRN, date of birth, ID band Patient awake    Reviewed: Allergy & Precautions, NPO status , Patient's Chart, lab work & pertinent test results  Airway Mallampati: II  TM Distance: >3 FB Neck ROM: Full    Dental no notable dental hx. (+) Upper Dentures, Poor Dentition   Pulmonary neg pulmonary ROS, COPDCurrent Smoker,  No home oxygen.   breath sounds clear to auscultation  Pulmonary exam normal       Cardiovascular METS: 3 - Mets hypertension, negative cardio ROS Normal cardiovascular examRhythm:Regular Rate:Normal     Neuro/Psych negative neurological ROS  negative psych ROS   GI/Hepatic negative GI ROS, Neg liver ROS, (+) Hepatitis -, CSome cirrhotic changes, but no ascites.  No coagulopathy, no encephalopathy.   Endo/Other  negative endocrine ROSdiabetes  Renal/GU negative Renal ROS  negative genitourinary   Musculoskeletal  (+) Arthritis -,   Abdominal   Peds negative pediatric ROS (+)  Hematology negative hematology ROS (+)   Anesthesia Other Findings   Reproductive/Obstetrics negative OB ROS                            Anesthesia Physical Anesthesia Plan  ASA: III  Anesthesia Plan: MAC   Post-op Pain Management:    Induction: Intravenous  Airway Management Planned: Natural Airway  Additional Equipment:   Intra-op Plan:   Post-operative Plan:   Informed Consent: I have reviewed the patients History and Physical, chart, labs and discussed the procedure including the risks, benefits and alternatives for the proposed anesthesia with the patient or authorized representative who has indicated his/her understanding and acceptance.   Dental advisory given  Plan Discussed with: CRNA  Anesthesia Plan Comments:         Anesthesia Quick Evaluation

## 2014-12-13 ENCOUNTER — Encounter: Payer: Self-pay | Admitting: Gastroenterology

## 2014-12-18 ENCOUNTER — Other Ambulatory Visit: Payer: Self-pay | Admitting: *Deleted

## 2014-12-18 DIAGNOSIS — E119 Type 2 diabetes mellitus without complications: Secondary | ICD-10-CM

## 2014-12-18 MED ORDER — INSULIN PEN NEEDLE 32G X 4 MM MISC: 1.0000 [IU] | Freq: Once | Status: DC

## 2014-12-19 ENCOUNTER — Other Ambulatory Visit: Payer: Self-pay

## 2014-12-19 DIAGNOSIS — D582 Other hemoglobinopathies: Secondary | ICD-10-CM | POA: Insufficient documentation

## 2014-12-20 ENCOUNTER — Inpatient Hospital Stay: Payer: Commercial Managed Care - HMO | Attending: Internal Medicine

## 2014-12-20 ENCOUNTER — Inpatient Hospital Stay: Payer: Commercial Managed Care - HMO

## 2014-12-20 DIAGNOSIS — D751 Secondary polycythemia: Secondary | ICD-10-CM | POA: Insufficient documentation

## 2014-12-20 DIAGNOSIS — D582 Other hemoglobinopathies: Secondary | ICD-10-CM

## 2014-12-20 LAB — HEMATOCRIT: HEMATOCRIT: 44.6 % (ref 35.0–47.0)

## 2014-12-20 NOTE — Progress Notes (Signed)
HCT 44.6 today.  Spoke with [facility] and said to use the same parameters from [facility], which were to Hold if HCT <45.  LJ

## 2014-12-24 ENCOUNTER — Telehealth: Payer: Self-pay | Admitting: Gastroenterology

## 2014-12-24 NOTE — Telephone Encounter (Signed)
Pt would like results

## 2014-12-27 ENCOUNTER — Telehealth: Payer: Self-pay | Admitting: Gastroenterology

## 2014-12-27 NOTE — Telephone Encounter (Signed)
I don't see you received the pt's pathology. Please review and advise.

## 2014-12-27 NOTE — Telephone Encounter (Signed)
Pt would like to know if you called [facility] yet?

## 2015-01-01 ENCOUNTER — Other Ambulatory Visit: Payer: Self-pay | Admitting: Internal Medicine

## 2015-01-01 DIAGNOSIS — I10 Essential (primary) hypertension: Secondary | ICD-10-CM

## 2015-01-01 MED ORDER — LOSARTAN POTASSIUM 100 MG PO TABS: 100.0000 mg | ORAL_TABLET | Freq: Every day | ORAL | Status: DC

## 2015-01-03 ENCOUNTER — Other Ambulatory Visit: Payer: Self-pay | Admitting: Family Medicine

## 2015-01-03 ENCOUNTER — Other Ambulatory Visit: Payer: Self-pay | Admitting: Internal Medicine

## 2015-01-03 ENCOUNTER — Other Ambulatory Visit: Payer: Self-pay

## 2015-01-03 ENCOUNTER — Telehealth: Payer: Self-pay | Admitting: Internal Medicine

## 2015-01-03 DIAGNOSIS — K589 Irritable bowel syndrome without diarrhea: Secondary | ICD-10-CM

## 2015-01-03 DIAGNOSIS — I10 Essential (primary) hypertension: Secondary | ICD-10-CM

## 2015-01-03 MED ORDER — FLUOXETINE HCL 40 MG PO CAPS: 40.0000 mg | ORAL_CAPSULE | Freq: Every day | ORAL | Status: DC

## 2015-01-03 MED ORDER — LOSARTAN POTASSIUM 100 MG PO TABS: 100.0000 mg | ORAL_TABLET | Freq: Every day | ORAL | Status: DC

## 2015-01-03 MED ORDER — ELUXADOLINE 75 MG PO TABS
75.0000 mg | ORAL_TABLET | Freq: Two times a day (BID) | ORAL | Status: DC
Start: 2015-01-03 — End: 2015-04-02

## 2015-01-03 MED ORDER — LOSARTAN POTASSIUM 100 MG PO TABS
100.0000 mg | ORAL_TABLET | Freq: Every day | ORAL | Status: DC
Start: 2015-01-03 — End: 2015-01-03

## 2015-01-03 NOTE — Telephone Encounter (Signed)
Pt is requesting a refill on the following medications:  1.Fluxetine 40mg  2.Lorsartan 100mg .  Pt uses [facility].  Her next appt. Is scheduled for 01/14/15.

## 2015-01-03 NOTE — Telephone Encounter (Signed)
Refilled medication 30 day supply no refills, sent to [facility]. Pt has appt on 01/14/2015, called and notified patient.

## 2015-01-03 NOTE — Telephone Encounter (Signed)
Reordered both meds Fluxetine 40mg  and losartan 100mg  due to wrong provider name on script.

## 2015-01-06 ENCOUNTER — Telehealth: Payer: Self-pay | Admitting: Gastroenterology

## 2015-01-06 NOTE — Telephone Encounter (Signed)
Nikki from [facility] that the Viberzi 75mg  is on back order but they have 100mg . Do you want to order that instead  [place]

## 2015-01-08 NOTE — Telephone Encounter (Signed)
No, Pt has had her gallbladder removed. We need to stay on 75mg .

## 2015-01-10 NOTE — Telephone Encounter (Signed)
Pt has been notified of results.

## 2015-01-10 NOTE — Telephone Encounter (Signed)
-----   Message from Lucilla Lame, MD sent at 12/09/2014  1:50 PM EDT ----- That the patient know that the blood test did not show any cause for her diarrhea. We will wait for the procedures with biopsies to look for other causes of her diarrhea. ----- Message -----    From: SYSTEM    Sent: 12/07/2014  12:04 AM      To: Lucilla Lame, MD

## 2015-01-14 ENCOUNTER — Ambulatory Visit: Payer: Commercial Managed Care - HMO | Admitting: Family Medicine

## 2015-01-31 ENCOUNTER — Ambulatory Visit: Payer: Commercial Managed Care - HMO | Admitting: Family Medicine

## 2015-02-04 ENCOUNTER — Encounter: Payer: Self-pay | Admitting: Family Medicine

## 2015-02-04 ENCOUNTER — Ambulatory Visit
Admission: RE | Admit: 2015-02-04 | Discharge: 2015-02-04 | Disposition: A | Discharge status: Home / Self Care | Payer: Commercial Managed Care - HMO | Source: Ambulatory Visit | Attending: Family Medicine | Admitting: Family Medicine

## 2015-02-04 ENCOUNTER — Ambulatory Visit (INDEPENDENT_AMBULATORY_CARE_PROVIDER_SITE_OTHER): Payer: Commercial Managed Care - HMO | Admitting: Family Medicine

## 2015-02-04 ENCOUNTER — Encounter (INDEPENDENT_AMBULATORY_CARE_PROVIDER_SITE_OTHER): Payer: Self-pay

## 2015-02-04 VITALS — BP 100/80 | HR 89 | Temp 98.4°F | Resp 19 | Ht <= 58 in | Wt 192.2 lb

## 2015-02-04 DIAGNOSIS — J410 Simple chronic bronchitis: Secondary | ICD-10-CM | POA: Diagnosis not present

## 2015-02-04 DIAGNOSIS — E785 Hyperlipidemia, unspecified: Secondary | ICD-10-CM | POA: Diagnosis not present

## 2015-02-04 DIAGNOSIS — N3000 Acute cystitis without hematuria: Secondary | ICD-10-CM

## 2015-02-04 DIAGNOSIS — Z794 Long term (current) use of insulin: Secondary | ICD-10-CM

## 2015-02-04 DIAGNOSIS — R05 Cough: Secondary | ICD-10-CM | POA: Diagnosis not present

## 2015-02-04 DIAGNOSIS — E1169 Type 2 diabetes mellitus with other specified complication: Secondary | ICD-10-CM | POA: Diagnosis not present

## 2015-02-04 DIAGNOSIS — E1165 Type 2 diabetes mellitus with hyperglycemia: Secondary | ICD-10-CM | POA: Diagnosis not present

## 2015-02-04 DIAGNOSIS — N390 Urinary tract infection, site not specified: Secondary | ICD-10-CM | POA: Insufficient documentation

## 2015-02-04 DIAGNOSIS — IMO0002 Reserved for concepts with insufficient information to code with codable children: Secondary | ICD-10-CM

## 2015-02-04 DIAGNOSIS — J42 Unspecified chronic bronchitis: Secondary | ICD-10-CM | POA: Insufficient documentation

## 2015-02-04 MED ORDER — ALBUTEROL SULFATE HFA 108 (90 BASE) MCG/ACT IN AERS: 2.0000 | INHALATION_SPRAY | Freq: Four times a day (QID) | RESPIRATORY_TRACT | Status: DC | PRN

## 2015-02-04 NOTE — Progress Notes (Addendum)
Name: Tara Cochran   MRN: [id number]    DOB: 15-Jan-1961   Date:02/04/2015       Progress Note  Subjective  Chief Complaint  Chief Complaint  Patient presents with  . Follow-up    check up/lab work/ possible UTI  . Medication Refill  . Asthma    Diabetes She presents for her follow-up diabetic visit. She has type 2 diabetes mellitus. Associated symptoms include fatigue and foot paresthesias. Pertinent negatives for diabetes include no chest pain. Current diabetic treatment includes intensive insulin program and diet. She is following a generally healthy diet. She rarely participates in exercise. Her breakfast blood glucose range is generally 180-200 mg/dl. An ACE inhibitor/angiotensin II receptor blocker is being taken. She does not see a podiatrist.Eye exam is not current.  Urinary Tract Infection  This is a new problem. The current episode started more than 1 month ago. The problem occurs every urination. The pain is at a severity of 3/10. The pain is mild. There has been no fever (low grade fever/elevated temperature.). There is no history of pyelonephritis. Associated symptoms include frequency and nausea. Pertinent negatives include no chills, flank pain, hematuria or vomiting. She has tried increased fluids for the symptoms.  Hyperlipidemia This is a chronic problem. The problem is uncontrolled. Recent lipid tests were reviewed and are high. Exacerbating diseases include diabetes and obesity. Pertinent negatives include no chest pain, leg pain or myalgias. She is currently on no antihyperlipidemic treatment. Risk factors for coronary artery disease include dyslipidemia, obesity and diabetes mellitus.     Past Medical History  Diagnosis Date  . Diabetes mellitus without complication   . Hypertension   . Polycythemia     Past Surgical History  Procedure Laterality Date  . Cholecystectomy    . Cesarean section    . Colonoscopy N/A 12/12/2014    Procedure: COLONOSCOPY;   Surgeon: Lucilla Lame, MD;  Location: [place];  Service: Gastroenterology;  Laterality: N/A;  . Esophagogastroduodenoscopy N/A 12/12/2014    Procedure: ESOPHAGOGASTRODUODENOSCOPY (EGD);  Surgeon: Lucilla Lame, MD;  Location: [place];  Service: Gastroenterology;  Laterality: N/A;  . Polypectomy  12/12/2014    Procedure: POLYPECTOMY;  Surgeon: Lucilla Lame, MD;  Location: [place];  Service: Gastroenterology;;    Family History  Problem Relation Age of Onset  . Stroke Mother   . Hypertension Mother   . Hypertension Father   . Stroke Father   . Heart disease Sister     History   Social History  . Marital Status: Divorced    Spouse Name: N/A  . Number of Children: N/A  . Years of Education: N/A   Occupational History  . Not on file.   Social History Main Topics  . Smoking status: Current Every Day Smoker -- 0.50 packs/day    Types: Cigarettes  . Smokeless tobacco: Never Used  . Alcohol Use: 0.6 oz/week    1 Glasses of wine per week  . Drug Use: 2.00 per week    Special: Marijuana  . Sexual Activity: Not on file   Other Topics Concern  . Not on file   Social History Narrative     Current outpatient prescriptions:  .  ACCU-CHEK SMARTVIEW test strip, as directed., Disp: , Rfl:  .  Eluxadoline (VIBERZI) 75 MG TABS, Take 75 mg by mouth 2 (two) times daily., Disp: 60 tablet, Rfl: 1 .  FLUoxetine (PROZAC) 40 MG capsule, Take 1 capsule (40 mg total) by mouth daily., Disp: 30  capsule, Rfl: 0 .  HUMALOG MIX 75/25 KWIKPEN (75-25) 100 UNIT/ML Kwikpen, , Disp: , Rfl:  .  Incontinence Supply Disposable (BLADDER CONTROL PADS EX ABSORB) MISC, daily., Disp: , Rfl:  .  insulin lispro (HUMALOG) 100 UNIT/ML injection, Inject 100 Units into the skin daily., Disp: , Rfl:  .  insulin lispro protamine-lispro (HUMALOG 75/25 MIX) (75-25) 100 UNIT/ML SUSP injection, Inject 60 Units into the skin 2 (two) times daily. , Disp: , Rfl:  .  Insulin Pen Needle (BD PEN  NEEDLE NANO U/F) 32G X 4 MM MISC, 1 Units by Does not apply route once. One needle per injection, Disp: 100 each, Rfl: 3 .  losartan (COZAAR) 100 MG tablet, Take 1 tablet (100 mg total) by mouth daily., Disp: 30 tablet, Rfl: 0 .  Multiple Vitamins-Minerals (WOMENS MULTIVITAMIN PLUS PO), daily., Disp: , Rfl:  .  pantoprazole (PROTONIX) 40 MG tablet, Take 1 tablet (40 mg total) by mouth daily before breakfast., Disp: 30 tablet, Rfl: 11 .  SUPREP BOWEL PREP SOLN, , Disp: , Rfl:   Allergies  Allergen Reactions  . Morphine Sulfate Itching     Review of Systems  Constitutional: Positive for fatigue. Negative for chills.  Cardiovascular: Negative for chest pain.  Gastrointestinal: Positive for nausea. Negative for vomiting.  Genitourinary: Positive for frequency. Negative for hematuria and flank pain.  Musculoskeletal: Negative for myalgias.     Objective  Filed Vitals:   02/04/15 0911  BP: 100/80  Pulse: 89  Temp: 98.4 F (36.9 C)  TempSrc: Oral  Resp: 19  Height: 4\' 10"  (1.473 m)  Weight: 192 lb 3.2 oz (87.181 kg)  SpO2: 96%    Physical Exam  Constitutional: She is oriented to person, place, and time and well-developed, well-nourished, and in no distress.  Cardiovascular: Normal rate and regular rhythm.   Pulmonary/Chest: Effort normal. No respiratory distress. She has wheezes in the left lower field.  Abdominal: Soft. There is no tenderness. There is no CVA tenderness.  Neurological: She is alert and oriented to person, place, and time.  Skin: Skin is warm and dry.  Nursing note and vitals reviewed.    Assessment & Plan 1. Simple chronic bronchitis CXR to rule out pneumonia. Patient started on rescue inhaler. Reevaluate in 4 weeks - DG Chest 2 View; Future - albuterol (PROVENTIL HFA;VENTOLIN HFA) 108 (90 BASE) MCG/ACT inhaler; Inhale 2 puffs into the lungs every 6 (six) hours as needed for wheezing or shortness of breath.  Dispense: 1 Inhaler; Refill: 2  2.  Dyslipidemia associated with type 2 diabetes mellitus Recheck fasting lipid panel and consider starting patient on a statin for primary prevention - Comprehensive metabolic panel - Lipid Profile  3. Acute cystitis without hematuria  - Urinalysis, Routine w reflex microscopic - Urine Culture Results of urinalysis confirm UTI with 2+ leukocyte esterase and greater than 30 WBCs per HPF.we will start patient on Augmentin and adjust antibiotic as necessary after review of urine culture, which is pending. 4. Uncontrolled type 2 diabetes mellitus with insulin therapy  - HgB A1c   Durell Lofaso [facility] 02/04/2015 9:31 AM

## 2015-02-05 ENCOUNTER — Telehealth: Payer: Self-pay | Admitting: Internal Medicine

## 2015-02-05 LAB — COMPREHENSIVE METABOLIC PANEL
ALK PHOS: 151 IU/L — AB (ref 39–117)
ALT: 18 IU/L (ref 0–32)
AST: 19 IU/L (ref 0–40)
Albumin/Globulin Ratio: 1.4 (ref 1.1–2.5)
Albumin: 4.1 g/dL (ref 3.5–5.5)
BUN/Creatinine Ratio: 17 (ref 9–23)
BUN: 11 mg/dL (ref 6–24)
Bilirubin Total: 0.4 mg/dL (ref 0.0–1.2)
CALCIUM: 10 mg/dL (ref 8.7–10.2)
CO2: 24 mmol/L (ref 18–29)
Chloride: 97 mmol/L (ref 97–108)
Creatinine, Ser: 0.66 mg/dL (ref 0.57–1.00)
GFR calc Af Amer: 117 mL/min/{1.73_m2} (ref >59)
GFR, EST NON AFRICAN AMERICAN: 101 mL/min/{1.73_m2} (ref >59)
Globulin, Total: 3 g/dL (ref 1.5–4.5)
Glucose: 279 mg/dL — ABNORMAL HIGH (ref 65–99)
Potassium: 4.6 mmol/L (ref 3.5–5.2)
SODIUM: 140 mmol/L (ref 134–144)
Total Protein: 7.1 g/dL (ref 6.0–8.5)

## 2015-02-05 LAB — HEMOGLOBIN A1C
ESTIMATED AVERAGE GLUCOSE: 194 mg/dL
HEMOGLOBIN A1C: 8.4 % — AB (ref 4.8–5.6)

## 2015-02-05 LAB — URINALYSIS, ROUTINE W REFLEX MICROSCOPIC
BILIRUBIN UA: NEGATIVE
Glucose, UA: NEGATIVE
Ketones, UA: NEGATIVE
NITRITE UA: NEGATIVE
PH UA: 6 (ref 5.0–7.5)
SPEC GRAV UA: 1.018 (ref 1.005–1.030)
Urobilinogen, Ur: 0.2 mg/dL (ref 0.2–1.0)

## 2015-02-05 LAB — LIPID PANEL
CHOLESTEROL TOTAL: 192 mg/dL (ref 100–199)
Chol/HDL Ratio: 4.2 ratio units (ref 0.0–4.4)
HDL: 46 mg/dL (ref >39)
LDL Calculated: 95 mg/dL (ref 0–99)
Triglycerides: 254 mg/dL — ABNORMAL HIGH (ref 0–149)
VLDL Cholesterol Cal: 51 mg/dL — ABNORMAL HIGH (ref 5–40)

## 2015-02-05 LAB — MICROSCOPIC EXAMINATION
Casts: NONE SEEN /lpf
WBC, UA: >30 /hpf — AB (ref 0–?)

## 2015-02-05 MED ORDER — AMOXICILLIN-POT CLAVULANATE 500-125 MG PO TABS: 500.0000 mg | ORAL_TABLET | Freq: Two times a day (BID) | ORAL | Status: DC

## 2015-02-05 NOTE — Telephone Encounter (Signed)
Routed to Dr. Manuella Ghazi Patient is requesting antibiotics.

## 2015-02-05 NOTE — Telephone Encounter (Signed)
Spoke with patient and discussed the results of urinalysis and urine microscopic exam, which is consistent with a UTI. Started patient on Augmentin 500 mg twice a day 5 day. Follow-up after culture results.

## 2015-02-05 NOTE — Addendum Note (Signed)
Addended byManuella Ghazi, Tullio Chausse A A on: 02/05/2015 08:03 PM   Modules accepted: Orders

## 2015-02-06 LAB — URINE CULTURE

## 2015-02-13 ENCOUNTER — Telehealth: Payer: Self-pay | Admitting: Internal Medicine

## 2015-02-13 NOTE — Telephone Encounter (Signed)
Pt is returning your call regarding recent test results.  Please call patient back to discuss.

## 2015-02-13 NOTE — Telephone Encounter (Signed)
Returned patient call and results has been reported

## 2015-02-14 ENCOUNTER — Other Ambulatory Visit: Payer: Self-pay | Admitting: Family Medicine

## 2015-02-14 ENCOUNTER — Other Ambulatory Visit: Payer: Self-pay

## 2015-02-14 ENCOUNTER — Telehealth: Payer: Self-pay | Admitting: Internal Medicine

## 2015-02-14 ENCOUNTER — Inpatient Hospital Stay: Payer: Commercial Managed Care - HMO | Attending: Internal Medicine

## 2015-02-14 ENCOUNTER — Inpatient Hospital Stay: Payer: Commercial Managed Care - HMO

## 2015-02-14 DIAGNOSIS — D751 Secondary polycythemia: Secondary | ICD-10-CM | POA: Diagnosis not present

## 2015-02-14 DIAGNOSIS — F329 Major depressive disorder, single episode, unspecified: Secondary | ICD-10-CM

## 2015-02-14 DIAGNOSIS — F32A Depression, unspecified: Secondary | ICD-10-CM

## 2015-02-14 LAB — HEMATOCRIT: HCT: 45.7 % (ref 35.0–47.0)

## 2015-02-14 MED ORDER — FLUOXETINE HCL 40 MG PO CAPS: 40.0000 mg | ORAL_CAPSULE | Freq: Every day | ORAL | Status: DC

## 2015-02-14 NOTE — Telephone Encounter (Signed)
Tara Cochran from [facility] requesting refill on Fluoxetine.

## 2015-02-14 NOTE — Telephone Encounter (Signed)
Routed to Dr. Shah for approval 

## 2015-02-17 NOTE — Telephone Encounter (Signed)
Medication has been refilled and sent to [facility]

## 2015-02-18 ENCOUNTER — Telehealth: Payer: Self-pay | Admitting: Gastroenterology

## 2015-02-18 ENCOUNTER — Ambulatory Visit (INDEPENDENT_AMBULATORY_CARE_PROVIDER_SITE_OTHER): Payer: Commercial Managed Care - HMO | Admitting: Family Medicine

## 2015-02-18 ENCOUNTER — Encounter: Payer: Self-pay | Admitting: Family Medicine

## 2015-02-18 VITALS — BP 118/78 | HR 108 | Temp 98.5°F | Resp 20 | Ht <= 58 in | Wt 190.4 lb

## 2015-02-18 DIAGNOSIS — M5410 Radiculopathy, site unspecified: Secondary | ICD-10-CM

## 2015-02-18 DIAGNOSIS — IMO0002 Reserved for concepts with insufficient information to code with codable children: Secondary | ICD-10-CM

## 2015-02-18 DIAGNOSIS — E1165 Type 2 diabetes mellitus with hyperglycemia: Secondary | ICD-10-CM

## 2015-02-18 DIAGNOSIS — Z794 Long term (current) use of insulin: Secondary | ICD-10-CM

## 2015-02-18 DIAGNOSIS — M5416 Radiculopathy, lumbar region: Secondary | ICD-10-CM

## 2015-02-18 DIAGNOSIS — F329 Major depressive disorder, single episode, unspecified: Secondary | ICD-10-CM | POA: Diagnosis not present

## 2015-02-18 DIAGNOSIS — F32A Depression, unspecified: Secondary | ICD-10-CM

## 2015-02-18 DIAGNOSIS — E785 Hyperlipidemia, unspecified: Secondary | ICD-10-CM

## 2015-02-18 DIAGNOSIS — G8929 Other chronic pain: Secondary | ICD-10-CM | POA: Insufficient documentation

## 2015-02-18 DIAGNOSIS — E1169 Type 2 diabetes mellitus with other specified complication: Secondary | ICD-10-CM | POA: Diagnosis not present

## 2015-02-18 MED ORDER — METAXALONE 800 MG PO TABS: 800.0000 mg | ORAL_TABLET | Freq: Three times a day (TID) | ORAL | Status: DC | PRN

## 2015-02-18 MED ORDER — FLUOXETINE HCL 20 MG PO CAPS: 20.0000 mg | ORAL_CAPSULE | Freq: Every day | ORAL | Status: DC

## 2015-02-18 MED ORDER — GLIPIZIDE 5 MG PO TABS: 2.5000 mg | ORAL_TABLET | Freq: Every day | ORAL | Status: DC

## 2015-02-18 MED ORDER — FLUOXETINE HCL 10 MG PO CAPS
10.0000 mg | ORAL_CAPSULE | Freq: Every day | ORAL | Status: DC
Start: 2015-02-18 — End: 2015-05-09

## 2015-02-18 MED ORDER — INSULIN GLARGINE 100 UNIT/ML ~~LOC~~ SOLN: 10.0000 [IU] | Freq: Every day | SUBCUTANEOUS | Status: DC

## 2015-02-18 NOTE — Telephone Encounter (Signed)
Patient said her PCP has mentioned putting her on a medication and she needs to make sure its ok first

## 2015-02-18 NOTE — Telephone Encounter (Signed)
LVM for pt to return my call.

## 2015-02-18 NOTE — Progress Notes (Signed)
Name: Tara Cochran   MRN: [id number]    DOB: 25-Oct-1960   Date:02/18/2015       Progress Note  Subjective  Chief Complaint  Chief Complaint  Patient presents with  . Follow-up    test results for UTI  . Diabetes  . Hyperlipidemia    Diabetes She presents for her follow-up diabetic visit. She has type 2 diabetes mellitus. Her disease course has been stable. Hypoglycemia symptoms include nervousness/anxiousness. Pertinent negatives for hypoglycemia include no dizziness, headaches, hunger or seizures. Pertinent negatives for diabetes include no fatigue, no polydipsia and no polyuria. Pertinent negatives for diabetic complications include no CVA or heart disease. Current diabetic treatment includes intensive insulin program. Her breakfast blood glucose range is generally 140-180 mg/dl. An ACE inhibitor/angiotensin II receptor blocker is being taken.  Hyperlipidemia This is a chronic problem. Recent lipid tests were reviewed and are high (elevated Triglycerides. Normal LDL and TC.). Pertinent negatives include no leg pain. She is currently on no antihyperlipidemic treatment (Pt. w/ hx of Hepatitis C, and need GI approval before starting on Statin therapy.).  Back Pain This is a chronic problem. The pain is present in the lumbar spine and gluteal. The quality of the pain is described as aching and burning. The pain does not radiate. The pain is at a severity of 9/10. The symptoms are aggravated by bending and position. Associated symptoms include paresis. Pertinent negatives include no bladder incontinence, bowel incontinence, dysuria, headaches, leg pain or numbness. She has tried analgesics (Has tried [facility] for the low back pain.) for the symptoms. The treatment provided significant relief.  Depression Patient is here to follow-up on depression. Long-standing history, currently on  Prozac 40 mg daily. Patient wishes to be switched to a different antidepressant. He believes Prozac is not  working as well as she would like.   Past Medical History  Diagnosis Date  . Diabetes mellitus without complication   . Hypertension   . Polycythemia     Past Surgical History  Procedure Laterality Date  . Cholecystectomy    . Cesarean section    . Colonoscopy N/A 12/12/2014    Procedure: COLONOSCOPY;  Surgeon: Lucilla Lame, MD;  Location: [place];  Service: Gastroenterology;  Laterality: N/A;  . Esophagogastroduodenoscopy N/A 12/12/2014    Procedure: ESOPHAGOGASTRODUODENOSCOPY (EGD);  Surgeon: Lucilla Lame, MD;  Location: [place];  Service: Gastroenterology;  Laterality: N/A;  . Polypectomy  12/12/2014    Procedure: POLYPECTOMY;  Surgeon: Lucilla Lame, MD;  Location: [place];  Service: Gastroenterology;;    Family History  Problem Relation Age of Onset  . Stroke Mother   . Hypertension Mother   . Hypertension Father   . Stroke Father   . Heart disease Sister     Social History   Social History  . Marital Status: Divorced    Spouse Name: N/A  . Number of Children: N/A  . Years of Education: N/A   Occupational History  . Not on file.   Social History Main Topics  . Smoking status: Current Every Day Smoker -- 0.50 packs/day    Types: Cigarettes  . Smokeless tobacco: Never Used  . Alcohol Use: 0.6 oz/week    1 Glasses of wine per week  . Drug Use: 2.00 per week    Special: Marijuana  . Sexual Activity: Not on file   Other Topics Concern  . Not on file   Social History Narrative     Current outpatient prescriptions:  .  ACCU-CHEK SMARTVIEW test strip, as directed., Disp: , Rfl:  .  albuterol (PROVENTIL HFA;VENTOLIN HFA) 108 (90 BASE) MCG/ACT inhaler, Inhale 2 puffs into the lungs every 6 (six) hours as needed for wheezing or shortness of breath., Disp: 1 Inhaler, Rfl: 2 .  amoxicillin-clavulanate (AUGMENTIN) 500-125 MG per tablet, Take 1 tablet (500 mg total) by mouth 2 (two) times daily after a meal., Disp: 10 tablet, Rfl: 0 .   Eluxadoline (VIBERZI) 75 MG TABS, Take 75 mg by mouth 2 (two) times daily., Disp: 60 tablet, Rfl: 1 .  FLUoxetine (PROZAC) 40 MG capsule, Take 1 capsule (40 mg total) by mouth daily., Disp: 90 capsule, Rfl: 1 .  HUMALOG MIX 75/25 KWIKPEN (75-25) 100 UNIT/ML Kwikpen, , Disp: , Rfl:  .  Incontinence Supply Disposable (BLADDER CONTROL PADS EX ABSORB) MISC, daily., Disp: , Rfl:  .  insulin lispro (HUMALOG) 100 UNIT/ML injection, Inject 100 Units into the skin daily., Disp: , Rfl:  .  insulin lispro protamine-lispro (HUMALOG 75/25 MIX) (75-25) 100 UNIT/ML SUSP injection, Inject 60 Units into the skin 2 (two) times daily. , Disp: , Rfl:  .  Insulin Pen Needle (BD PEN NEEDLE NANO U/F) 32G X 4 MM MISC, 1 Units by Does not apply route once. One needle per injection, Disp: 100 each, Rfl: 3 .  losartan (COZAAR) 100 MG tablet, Take 1 tablet (100 mg total) by mouth daily., Disp: 30 tablet, Rfl: 0 .  Multiple Vitamins-Minerals (WOMENS MULTIVITAMIN PLUS PO), daily., Disp: , Rfl:  .  pantoprazole (PROTONIX) 40 MG tablet, Take 1 tablet (40 mg total) by mouth daily before breakfast., Disp: 30 tablet, Rfl: 11 .  SUPREP BOWEL PREP SOLN, , Disp: , Rfl:   Allergies  Allergen Reactions  . Morphine Sulfate Itching     Review of Systems  Constitutional: Negative for fatigue.  Gastrointestinal: Negative for bowel incontinence.  Genitourinary: Negative for bladder incontinence, dysuria and urgency.  Musculoskeletal: Positive for back pain.  Neurological: Negative for dizziness, seizures, numbness and headaches.  Endo/Heme/Allergies: Negative for polydipsia.  Psychiatric/Behavioral: Positive for depression. The patient is nervous/anxious.      Objective  Filed Vitals:   02/18/15 1022  BP: 118/78  Pulse: 108  Temp: 98.5 F (36.9 C)  TempSrc: Oral  Resp: 20  Height: 4\' 10"  (1.473 m)  Weight: 190 lb 6.4 oz (86.365 kg)  SpO2: 96%    Physical Exam  Constitutional: She is oriented to person, place, and  time and well-developed, well-nourished, and in no distress.  Cardiovascular: Normal rate and regular rhythm.   Pulmonary/Chest: Effort normal and breath sounds normal.  Musculoskeletal:       Lumbar back: She exhibits tenderness, pain and spasm.       Back:  Neurological: She is alert and oriented to person, place, and time.  Skin: Skin is warm and dry.  Psychiatric: Mood, memory, affect and judgment normal.  Nursing note and vitals reviewed.   Assessment & Plan  1. Dyslipidemia associated with type 2 diabetes mellitus Patient is asked to confirm with her gastroenterologist regarding statin therapy.  2. Uncontrolled type 2 diabetes mellitus with insulin therapy DC Humalog and started patient on Lantus 10 units at bedtime. Titrate according to fasting a.m. Blood glucose values. In addition, started patient on glipizide  2.5 mg every morning. Recheck A1c in 3 months. - insulin glargine (LANTUS) 100 UNIT/ML injection; Inject 0.1 mLs (10 Units total) into the skin at bedtime.  Dispense: 10 mL; Refill: 11 - glipiZIDE (GLUCOTROL) 5  MG tablet; Take 0.5 tablets (2.5 mg total) by mouth daily before breakfast.  Dispense: 90 tablet; Refill: 0  3. Depression Pt. will DC fluoxetine be a slow taper over the next 4 weeks and return to be started on a different antidepressant. - FLUoxetine (PROZAC) 20 MG capsule; Take 1 capsule (20 mg total) by mouth daily.  Dispense: 7 capsule; Refill: 0 - FLUoxetine (PROZAC) 10 MG capsule; Take 1 capsule (10 mg total) by mouth daily.  Dispense: 7 capsule; Refill: 0  4. Chronic radicular low back pain Obtain x-rays of lumbar spine and right hip and pelvis for evaluation of degenerative disc disease as reported by patient. Started her on Skelaxin 800 mg daily as needed for relief. - metaxalone (SKELAXIN) 800 MG tablet; Take 1 tablet (800 mg total) by mouth 3 (three) times daily as needed for muscle spasms.  Dispense: 90 tablet; Refill: 0 - DG Lumbar Spine Complete;  Future - DG HIP UNILAT WITH PELVIS 2-3 VIEWS RIGHT; Future    Lenice Koper [facility] 02/18/2015 10:30 AM

## 2015-02-19 NOTE — Telephone Encounter (Signed)
LVM a second time for pt to return my call.

## 2015-02-21 ENCOUNTER — Telehealth: Payer: Self-pay | Admitting: Family Medicine

## 2015-02-21 NOTE — Telephone Encounter (Signed)
Routed note to Dr. Manuella Ghazi for Medication Advice

## 2015-02-21 NOTE — Telephone Encounter (Signed)
Dr. Manuella Ghazi has spoken to patient and discussed that we will not start her on a statin therapy as it may cause an elevation in liver enzymes. Patient verbalized understanding.

## 2015-02-21 NOTE — Telephone Encounter (Signed)
Called patient back and discussed that we will not start her on a statin therapy as it may cause an elevation in liver enzymes. Patient verbalized understanding.

## 2015-02-21 NOTE — Telephone Encounter (Signed)
Pt states that Dr Manuella Ghazi wanted her to start on Statin and pt states that Dr Allen Norris does not want her on them. Pt would like a call back to be advised.

## 2015-02-21 NOTE — Telephone Encounter (Signed)
Pt returned call. Pt's physician wanted to start her on a statin drug for her cholesterol. Advised her it is not advisable to use this medication as statin drugs tend to increase liver enzymes. Will speak with her physician about starting another.

## 2015-03-10 ENCOUNTER — Telehealth: Payer: Self-pay | Admitting: Internal Medicine

## 2015-03-10 DIAGNOSIS — I10 Essential (primary) hypertension: Secondary | ICD-10-CM

## 2015-03-10 MED ORDER — LOSARTAN POTASSIUM 100 MG PO TABS
100.0000 mg | ORAL_TABLET | Freq: Every day | ORAL | Status: DC
Start: 2015-03-10 — End: 2015-04-11

## 2015-03-10 NOTE — Telephone Encounter (Signed)
Medication has been refilled and sent to [facility] °

## 2015-03-24 ENCOUNTER — Ambulatory Visit: Payer: Commercial Managed Care - HMO | Admitting: Family Medicine

## 2015-04-02 ENCOUNTER — Other Ambulatory Visit: Payer: Self-pay

## 2015-04-02 DIAGNOSIS — K580 Irritable bowel syndrome with diarrhea: Secondary | ICD-10-CM

## 2015-04-02 MED ORDER — ELUXADOLINE 75 MG PO TABS
75.0000 mg | ORAL_TABLET | Freq: Two times a day (BID) | ORAL | Status: DC
Start: 2015-04-02 — End: 2015-10-09

## 2015-04-11 ENCOUNTER — Ambulatory Visit: Payer: Commercial Managed Care - HMO | Admitting: Family Medicine

## 2015-04-11 ENCOUNTER — Other Ambulatory Visit: Payer: Commercial Managed Care - HMO

## 2015-04-11 ENCOUNTER — Other Ambulatory Visit: Payer: Self-pay | Admitting: Family Medicine

## 2015-04-11 DIAGNOSIS — I10 Essential (primary) hypertension: Secondary | ICD-10-CM

## 2015-04-11 MED ORDER — LOSARTAN POTASSIUM 100 MG PO TABS: 100.0000 mg | ORAL_TABLET | Freq: Every day | ORAL | Status: DC

## 2015-04-11 NOTE — Telephone Encounter (Signed)
Medication has been refilled and sent to [facility] °

## 2015-04-15 ENCOUNTER — Ambulatory Visit: Payer: Commercial Managed Care - HMO | Admitting: Family Medicine

## 2015-04-15 ENCOUNTER — Other Ambulatory Visit: Payer: Commercial Managed Care - HMO

## 2015-04-25 ENCOUNTER — Telehealth: Payer: Self-pay | Admitting: Family Medicine

## 2015-04-25 DIAGNOSIS — Z794 Long term (current) use of insulin: Principal | ICD-10-CM

## 2015-04-25 DIAGNOSIS — IMO0002 Reserved for concepts with insufficient information to code with codable children: Secondary | ICD-10-CM

## 2015-04-25 DIAGNOSIS — E1165 Type 2 diabetes mellitus with hyperglycemia: Secondary | ICD-10-CM

## 2015-04-25 NOTE — Telephone Encounter (Signed)
Called patient to confirm the blood glucose testing frequency. She is testing  Blood glucose twice daily.

## 2015-05-09 ENCOUNTER — Other Ambulatory Visit: Payer: Commercial Managed Care - HMO

## 2015-05-09 ENCOUNTER — Ambulatory Visit: Payer: Commercial Managed Care - HMO

## 2015-05-09 ENCOUNTER — Inpatient Hospital Stay: Payer: Commercial Managed Care - HMO | Admitting: *Deleted

## 2015-05-09 ENCOUNTER — Other Ambulatory Visit: Payer: Self-pay

## 2015-05-09 ENCOUNTER — Inpatient Hospital Stay: Payer: Commercial Managed Care - HMO | Attending: Hematology and Oncology | Admitting: Hematology and Oncology

## 2015-05-09 ENCOUNTER — Inpatient Hospital Stay: Payer: Commercial Managed Care - HMO

## 2015-05-09 ENCOUNTER — Other Ambulatory Visit: Payer: Self-pay | Admitting: Hematology and Oncology

## 2015-05-09 VITALS — BP 129/84 | HR 78 | Temp 96.1°F | Wt 186.0 lb

## 2015-05-09 DIAGNOSIS — E119 Type 2 diabetes mellitus without complications: Secondary | ICD-10-CM | POA: Diagnosis not present

## 2015-05-09 DIAGNOSIS — D751 Secondary polycythemia: Secondary | ICD-10-CM

## 2015-05-09 DIAGNOSIS — Z79899 Other long term (current) drug therapy: Secondary | ICD-10-CM | POA: Diagnosis not present

## 2015-05-09 DIAGNOSIS — K589 Irritable bowel syndrome without diarrhea: Secondary | ICD-10-CM | POA: Insufficient documentation

## 2015-05-09 DIAGNOSIS — F17210 Nicotine dependence, cigarettes, uncomplicated: Secondary | ICD-10-CM

## 2015-05-09 DIAGNOSIS — J449 Chronic obstructive pulmonary disease, unspecified: Secondary | ICD-10-CM | POA: Diagnosis not present

## 2015-05-09 DIAGNOSIS — F17200 Nicotine dependence, unspecified, uncomplicated: Secondary | ICD-10-CM

## 2015-05-09 DIAGNOSIS — I7389 Other specified peripheral vascular diseases: Secondary | ICD-10-CM

## 2015-05-09 DIAGNOSIS — B1920 Unspecified viral hepatitis C without hepatic coma: Secondary | ICD-10-CM | POA: Diagnosis not present

## 2015-05-09 DIAGNOSIS — I10 Essential (primary) hypertension: Secondary | ICD-10-CM | POA: Diagnosis not present

## 2015-05-09 DIAGNOSIS — R634 Abnormal weight loss: Secondary | ICD-10-CM

## 2015-05-09 DIAGNOSIS — R599 Enlarged lymph nodes, unspecified: Secondary | ICD-10-CM

## 2015-05-09 DIAGNOSIS — Z794 Long term (current) use of insulin: Secondary | ICD-10-CM | POA: Diagnosis not present

## 2015-05-09 LAB — HEMATOCRIT: HEMATOCRIT: 43.8 % (ref 35.0–47.0)

## 2015-05-14 ENCOUNTER — Other Ambulatory Visit: Payer: Self-pay | Admitting: Family Medicine

## 2015-05-15 ENCOUNTER — Encounter: Payer: Self-pay | Admitting: Hematology and Oncology

## 2015-05-15 ENCOUNTER — Other Ambulatory Visit: Payer: Self-pay | Admitting: Hematology and Oncology

## 2015-05-15 DIAGNOSIS — F17210 Nicotine dependence, cigarettes, uncomplicated: Secondary | ICD-10-CM

## 2015-05-15 NOTE — Progress Notes (Signed)
[facility] day:  05/09/2015  Chief Complaint: Tara Cochran is a 54 y.o. female with secondary polycythemia who is seen for reassessment.  HPI:   The patient has had erythrocytosis since 12/2012. Labs on 01/15/2013 included a hematocrit 49.5 and hemoglobin 17.7. Hematocrit has ranged between 41.7 and 49.5.  She was seen in initial consultation by Dr. Ma Hillock.  Workup on 03/02/2013 revealed a JAK2 V617F negative, normal iron studies, epo level 23.9 and carboxyhemoglobin of 5%.   She is felt to have erythrocytosis secondary to COPD due to smoking. She states that smoked up to 2 packs a day for 35 years. She is now smoking half a pack a day. She is unclear if she has any sleep apnea. She snores a little. She is "always tired".  The patient was last seen in medical oncology clinic by Dr. Ma Hillock on 05/10/2014. At that time CBC included a hematocrit of 44.8, hemoglobin 15.2, platelets 376,000, white count 11,000 with an [place] of 7300. MCV was 88. Plan was to follow her hematocrit every 8 weeks and pursue a 300 ml phlebotomy if her hematocrit was 45 or higher.  Symptomatically, she is fatigued. She notes some tingling in her fingers and toes secondary to diabetes. She had diarrhea for more than 6 months. She is followed by Dr. Allen Norris. She had an upper and lower endoscopy 6 months ago. She has recently been prescribed a new medication for irritable bowel syndrome (IBS).  It is helping.  She states that her weight decreased from 234 pounds to 185 pounds in the past 6 months.  Regarding her diet, she states that she tries to eat healthy including beans and vegetables. She denies any melena or hematochezia. She has had no recent mammograms. The last mammogram was a few years ago. She has a history hepatitis C status post treatment.    Past Medical History  Diagnosis Date  . Diabetes mellitus without complication   . Hypertension   . Polycythemia     Past  Surgical History  Procedure Laterality Date  . Cholecystectomy    . Cesarean section    . Colonoscopy N/A 12/12/2014    Procedure: COLONOSCOPY;  Surgeon: Lucilla Lame, MD;  Location: [facility];  Service: Gastroenterology;  Laterality: N/A;  . Esophagogastroduodenoscopy N/A 12/12/2014    Procedure: ESOPHAGOGASTRODUODENOSCOPY (EGD);  Surgeon: Lucilla Lame, MD;  Location: [facility];  Service: Gastroenterology;  Laterality: N/A;  . Polypectomy  12/12/2014    Procedure: POLYPECTOMY;  Surgeon: Lucilla Lame, MD;  Location: [place];  Service: Gastroenterology;;    Family History  Problem Relation Age of Onset  . Stroke Mother   . Hypertension Mother   . Hypertension Father   . Stroke Father   . Heart disease Sister     Social History:  reports that she has been smoking Cigarettes.  She has been smoking about 0.50 packs per day. She has never used smokeless tobacco. She reports that she drinks about 0.6 oz of alcohol per week. She reports that she uses illicit drugs (Marijuana) about twice per week.  The patient is alone today.  Allergies:  Allergies  Allergen Reactions  . Morphine Sulfate Itching    Current Medications: Current Outpatient Prescriptions  Medication Sig Dispense Refill  . ACCU-CHEK SMARTVIEW test strip as directed.    Marland Kitchen albuterol (PROVENTIL HFA;VENTOLIN HFA) 108 (90 BASE) MCG/ACT inhaler Inhale 2 puffs into the lungs every 6 (six) hours as needed for  wheezing or shortness of breath. 1 Inhaler 2  . Eluxadoline (VIBERZI) 75 MG TABS Take 75 mg by mouth 2 (two) times daily. 60 tablet 5  . FLUoxetine (PROZAC) 40 MG capsule Take 1 capsule (40 mg total) by mouth daily. 90 capsule 1  . glipiZIDE (GLUCOTROL) 5 MG tablet Take 0.5 tablets (2.5 mg total) by mouth daily before breakfast. 90 tablet 0  . Incontinence Supply Disposable (BLADDER CONTROL PADS EX ABSORB) MISC daily.    . insulin glargine (LANTUS) 100 UNIT/ML injection Inject 0.1 mLs (10 Units  total) into the skin at bedtime. 10 mL 11  . Insulin Pen Needle (BD PEN NEEDLE NANO U/F) 32G X 4 MM MISC 1 Units by Does not apply route once. One needle per injection 100 each 3  . losartan (COZAAR) 100 MG tablet Take 1 tablet (100 mg total) by mouth daily. 30 tablet 1  . metaxalone (SKELAXIN) 800 MG tablet Take 1 tablet (800 mg total) by mouth 3 (three) times daily as needed for muscle spasms. 90 tablet 0  . Multiple Vitamins-Minerals (WOMENS MULTIVITAMIN PLUS PO) daily.    . pantoprazole (PROTONIX) 40 MG tablet Take 1 tablet (40 mg total) by mouth daily before breakfast. 30 tablet 11   No current facility-administered medications for this visit.    Review of Systems:  GENERAL:  Feels tired.  No fevers or sweats.  Weight loss of 49 pounds in 6 months.. PERFORMANCE STATUS (ECOG):  1 HEENT:  Right ear problem.  No visual changes, runny nose, sore throat, mouth sores or tenderness. Lungs: No shortness of breath or cough.  No hemoptysis. Cardiac:  No chest pain, palpitations, orthopnea, or PND. GI:  Irritable bowel.  Diarrhea x 6 months.  No nausea, vomiting, constipation, melena or hematochezia. GU:  No urgency, frequency, dysuria, or hematuria. Musculoskeletal:  Back and hip pain.  No muscle tenderness. Extremities:  No pain or swelling. Skin:  No rashes or skin changes. Neuro:  Tingling in fingers and toes secondary to diabetes.  Headache.  No weakness, balance or coordination issues. Endocrine: Diabetes.  No thyroid issues, hot flashes or night sweats. Psych:  No mood changes, depression or anxiety. Pain:  No focal pain. Review of systems:  All other systems reviewed and found to be negative.  Physical Exam: Blood pressure 129/84, pulse 78, temperature 96.1 F (35.6 C), temperature source Tympanic, weight 185 lb 15.3 oz (84.35 kg). GENERAL:  Heavy set woman sitting comfortably in the exam room in no acute distress. MENTAL STATUS:  Alert and oriented to person, place and time. HEAD:   Long brown hair.  Normocephalic, atraumatic, face symmetric, no Cushingoid features. EYES:  Glasses.  Brown eyes.  Pupils equal round and reactive to light and accomodation.  No conjunctivitis or scleral icterus. ENT:  Oropharynx clear without lesion.  Upper dentures.  Tongue normal. Mucous membranes moist. Tympanic membranes benign. RESPIRATORY:  Clear to auscultation without rales, wheezes or rhonchi. CARDIOVASCULAR:  Regular rate and rhythm without murmur, rub or gallop. BREAST:  Right breast without masses, skin changes or nipple discharge.  Left breast with fibrocystic changes, but no discrete mass, skin changes or nipple discharge.  ABDOMEN:  Soft, non-tender, with active bowel sounds, and no appreciable hepatosplenomegaly.  No masses. SKIN:  No rashes, ulcers or lesions. EXTREMITIES: No edema, no skin discoloration or tenderness.  No palpable cords. LYMPH NODES: Small left axillary lymph node.  No palpable cervical, supraclavicular, or inguinal adenopathy  NEUROLOGICAL: Unremarkable. PSYCH:  Appropriate.  Clinical Support  on 05/09/2015  Component Date Value Ref Range Status  . HCT 05/09/2015 43.8  35.0 - 47.0 % Final    Assessment:  Tara Cochran is a 55 y.o. female with secondary erythrocytosis secondary to smoking and COPD.  She has a 50-70 pack year smoking history.  She has lost 49 pounds in the past 6 months.  She has a history of hepatitis C status post treatment.  Work-up on 03/02/2013 revealed JAK2 V617F negative, normal iron studies, epo level 23.9 and carboxyhemoglobin of 5%.  She may have sleep apnea.  She undergoes phlebotomy of 300 cc for a hematocrit > 45.  She has not had a mammogram in years.  She denies any breast concerns.  She has a 6 month history of diarrhea felt secondary to irritable bowel syndrome (IBS).  Upper and lower endoscopy on 12/12/2014 revealed grade a reflux esophagitis, small hiatal hernia, gastritis with hemorrhage and duodenitis.  Lower  endoscopy revealed one 4 mm rectal polyp and non-bleeding internal hemorrhoids.  Biopsies were benign.  Symptomatically, she is fatigued.  Exam reveals a small left axillary lymph node.  Hematocrit is normal.  Plan: 1. Review entire medical history, diagnosis and management of secondary erythrocytosis. 2. Discuss concern for weight loss. 3. Labs today:  Hematocrit. 4. Low dose spiral chest CT- surveillance; significant smoking history with unexplained weight loss. 5. Bilateral diagnostic mammogram and ultrasound- no mammogram in years; small left axillary node. 6. Consider sleep apnea testing. 7. Discuss smoking cessation. 8. RTC in 8 weeks for hematocrit +/- phlebtomy. 9. RTC for MD assess after chest CT and mammogram.  Lequita Asal, MD  05/09/2015

## 2015-05-16 ENCOUNTER — Ambulatory Visit: Payer: Commercial Managed Care - HMO

## 2015-05-16 ENCOUNTER — Ambulatory Visit: Admission: RE | Admit: 2015-05-16 | Payer: Commercial Managed Care - HMO | Source: Ambulatory Visit

## 2015-05-26 ENCOUNTER — Ambulatory Visit
Admission: RE | Admit: 2015-05-26 | Discharge: 2015-05-26 | Disposition: A | Discharge status: Home / Self Care | Payer: Commercial Managed Care - HMO | Source: Ambulatory Visit | Attending: Hematology and Oncology | Admitting: Hematology and Oncology

## 2015-05-26 DIAGNOSIS — R918 Other nonspecific abnormal finding of lung field: Secondary | ICD-10-CM | POA: Insufficient documentation

## 2015-05-26 DIAGNOSIS — F17200 Nicotine dependence, unspecified, uncomplicated: Secondary | ICD-10-CM | POA: Insufficient documentation

## 2015-05-26 DIAGNOSIS — R634 Abnormal weight loss: Secondary | ICD-10-CM | POA: Insufficient documentation

## 2015-05-26 DIAGNOSIS — I2510 Atherosclerotic heart disease of native coronary artery without angina pectoris: Secondary | ICD-10-CM | POA: Insufficient documentation

## 2015-05-26 DIAGNOSIS — R700 Elevated erythrocyte sedimentation rate: Secondary | ICD-10-CM | POA: Diagnosis not present

## 2015-05-26 DIAGNOSIS — R9389 Abnormal findings on diagnostic imaging of other specified body structures: Secondary | ICD-10-CM | POA: Insufficient documentation

## 2015-05-28 ENCOUNTER — Other Ambulatory Visit: Payer: Self-pay | Admitting: Hematology and Oncology

## 2015-05-30 ENCOUNTER — Inpatient Hospital Stay: Payer: Commercial Managed Care - HMO | Attending: Hematology and Oncology | Admitting: Hematology and Oncology

## 2015-05-30 VITALS — HR 90 | Temp 97.7°F | Resp 18 | Ht <= 58 in | Wt 183.6 lb

## 2015-05-30 DIAGNOSIS — Z79899 Other long term (current) drug therapy: Secondary | ICD-10-CM | POA: Diagnosis not present

## 2015-05-30 DIAGNOSIS — F17210 Nicotine dependence, cigarettes, uncomplicated: Secondary | ICD-10-CM | POA: Insufficient documentation

## 2015-05-30 DIAGNOSIS — K449 Diaphragmatic hernia without obstruction or gangrene: Secondary | ICD-10-CM | POA: Diagnosis not present

## 2015-05-30 DIAGNOSIS — B1920 Unspecified viral hepatitis C without hepatic coma: Secondary | ICD-10-CM | POA: Diagnosis not present

## 2015-05-30 DIAGNOSIS — R918 Other nonspecific abnormal finding of lung field: Secondary | ICD-10-CM | POA: Insufficient documentation

## 2015-05-30 DIAGNOSIS — J449 Chronic obstructive pulmonary disease, unspecified: Secondary | ICD-10-CM | POA: Insufficient documentation

## 2015-05-30 DIAGNOSIS — R634 Abnormal weight loss: Secondary | ICD-10-CM | POA: Insufficient documentation

## 2015-05-30 DIAGNOSIS — R5383 Other fatigue: Secondary | ICD-10-CM | POA: Insufficient documentation

## 2015-05-30 DIAGNOSIS — K648 Other hemorrhoids: Secondary | ICD-10-CM | POA: Diagnosis not present

## 2015-05-30 DIAGNOSIS — I10 Essential (primary) hypertension: Secondary | ICD-10-CM | POA: Insufficient documentation

## 2015-05-30 DIAGNOSIS — Z794 Long term (current) use of insulin: Secondary | ICD-10-CM | POA: Diagnosis not present

## 2015-05-30 DIAGNOSIS — E119 Type 2 diabetes mellitus without complications: Secondary | ICD-10-CM | POA: Diagnosis not present

## 2015-05-30 DIAGNOSIS — K621 Rectal polyp: Secondary | ICD-10-CM | POA: Insufficient documentation

## 2015-05-30 DIAGNOSIS — B182 Chronic viral hepatitis C: Secondary | ICD-10-CM

## 2015-05-30 DIAGNOSIS — I2510 Atherosclerotic heart disease of native coronary artery without angina pectoris: Secondary | ICD-10-CM | POA: Insufficient documentation

## 2015-05-30 DIAGNOSIS — K210 Gastro-esophageal reflux disease with esophagitis: Secondary | ICD-10-CM | POA: Insufficient documentation

## 2015-05-30 DIAGNOSIS — D751 Secondary polycythemia: Secondary | ICD-10-CM | POA: Insufficient documentation

## 2015-05-30 NOTE — Progress Notes (Signed)
[facility] day:  05/30/2015  Chief Complaint: VERNAE LINEN is a 54 y.o. female with secondary polycythemia who is seen for reassessment.  HPI:   The patient was last seen in the medical oncology clinic on 05/09/2015.  At that time, she was seen for initial assessment by me.  She was felt to have secondary erythrocytosis secondary to smoking and COPD.  She has a 50-70 pack year smoking history.  She had lost 49 pounds in the past 6 months.  She had a history of hepatitis C status post treatment.  Labs at last visit included a hematocrit of 43.8.  She did not undergo phlebotomy.  She was setup for screening chest CT and bilateral mammogram (none in years).  We discussed possible sleep apnea testing.  We discussed smoking cessation.  Chest CT on 05/26/2015 revealed a 1.4 cm of ground-glass opacity in the medial right lower lobe likely representing a small focus of infection.  Recommendation was for a follow-up CT thorax in 3 months to demonstrate persistence or resolution given her smoking history.  She had coronary artery calcification.  She did not obtain her mammogram.  Symptomatically, she notes chronic fatigue.     Past Medical History  Diagnosis Date  . Diabetes mellitus without complication ([place])   . Hypertension   . Polycythemia     Past Surgical History  Procedure Laterality Date  . Cholecystectomy    . Cesarean section    . Colonoscopy N/A 12/12/2014    Procedure: COLONOSCOPY;  Surgeon: Lucilla Lame, MD;  Location: [place];  Service: Gastroenterology;  Laterality: N/A;  . Esophagogastroduodenoscopy N/A 12/12/2014    Procedure: ESOPHAGOGASTRODUODENOSCOPY (EGD);  Surgeon: Lucilla Lame, MD;  Location: [facility];  Service: Gastroenterology;  Laterality: N/A;  . Polypectomy  12/12/2014    Procedure: POLYPECTOMY;  Surgeon: Lucilla Lame, MD;  Location: [facility];  Service: Gastroenterology;;    Family  History  Problem Relation Age of Onset  . Stroke Mother   . Hypertension Mother   . Hypertension Father   . Stroke Father   . Heart disease Sister     Social History:  reports that she has been smoking Cigarettes.  She has been smoking about 0.50 packs per day. She has never used smokeless tobacco. She reports that she drinks about 0.6 oz of alcohol per week. She reports that she uses illicit drugs (Marijuana) about twice per week.  The patient is alone today.  Allergies:  Allergies  Allergen Reactions  . Morphine Sulfate Itching    Current Medications: Current Outpatient Prescriptions  Medication Sig Dispense Refill  . ACCU-CHEK SMARTVIEW test strip as directed.    Marland Kitchen albuterol (PROVENTIL HFA;VENTOLIN HFA) 108 (90 BASE) MCG/ACT inhaler Inhale 2 puffs into the lungs every 6 (six) hours as needed for wheezing or shortness of breath. 1 Inhaler 2  . Eluxadoline (VIBERZI) 75 MG TABS Take 75 mg by mouth 2 (two) times daily. 60 tablet 5  . FLUoxetine (PROZAC) 40 MG capsule Take 1 capsule (40 mg total) by mouth daily. 90 capsule 1  . glipiZIDE (GLUCOTROL) 5 MG tablet Take 0.5 tablets (2.5 mg total) by mouth daily before breakfast. 90 tablet 0  . Incontinence Supply Disposable (BLADDER CONTROL PADS EX ABSORB) MISC daily.    . insulin glargine (LANTUS) 100 UNIT/ML injection Inject 0.1 mLs (10 Units total) into the skin at bedtime. 10 mL 11  . Insulin Pen Needle (BD PEN NEEDLE NANO  U/F) 32G X 4 MM MISC 1 Units by Does not apply route once. One needle per injection 100 each 3  . losartan (COZAAR) 100 MG tablet Take 1 tablet (100 mg total) by mouth daily. 30 tablet 1  . metaxalone (SKELAXIN) 800 MG tablet Take 1 tablet (800 mg total) by mouth 3 (three) times daily as needed for muscle spasms. 90 tablet 0  . Multiple Vitamins-Minerals (WOMENS MULTIVITAMIN PLUS PO) daily.    . pantoprazole (PROTONIX) 40 MG tablet Take 1 tablet (40 mg total) by mouth daily before breakfast. 30 tablet 11   No current  facility-administered medications for this visit.    Review of Systems:  GENERAL:  Feels tired.  No fevers or sweats.  Weight down 2 pounds. PERFORMANCE STATUS (ECOG):  1 HEENT:  No visual changes, runny nose, sore throat, mouth sores or tenderness. Lungs: No shortness of breath or cough.  No hemoptysis. Cardiac:  No chest pain, palpitations, orthopnea, or PND. GI:  Irritable bowel.  Diarrhea x 6 months.  No nausea, vomiting, constipation, melena or hematochezia. GU:  No urgency, frequency, dysuria, or hematuria. Musculoskeletal:  Back and hip pain.  No muscle tenderness. Extremities:  No pain or swelling. Skin:  No rashes or skin changes. Neuro:  Tingling in fingers and toes secondary to diabetes.  Headache.  No weakness, balance or coordination issues. Endocrine: Diabetes.  No thyroid issues, hot flashes or night sweats. Psych:  No mood changes, depression or anxiety. Pain:  No focal pain. Review of systems:  All other systems reviewed and found to be negative.  Physical Exam: Pulse 90, temperature 97.7 F (36.5 C), temperature source Tympanic, resp. rate 18, height 4\' 10"  (1.473 m), weight 183 lb 10.3 oz (83.3 kg). GENERAL:  Heavy set woman sitting comfortably in the exam room in no acute distress. MENTAL STATUS:  Alert and oriented to person, place and time. HEAD:  Long brown hair.  Normocephalic, atraumatic, face symmetric, no Cushingoid features. EYES:  Glasses.  Brown eyes.  No conjunctivitis or scleral icterus. RESPIRATORY:  Clear to auscultation without rales, wheezes or rhonchi. CARDIOVASCULAR:  Regular rate and rhythm without murmur, rub or gallop. NEUROLOGICAL: Unremarkable. PSYCH:  Appropriate.  No visits with results within 3 Day(s) from this visit. Latest known visit with results is:  Clinical Support on 05/09/2015  Component Date Value Ref Range Status  . HCT 05/09/2015 43.8  35.0 - 47.0 % Final    Assessment:  EMMARI PRUSKI is a 54 y.o. female with  secondary erythrocytosis secondary to smoking and COPD.  She has a 50-70 pack year smoking history.  She has lost 49 pounds in the past 6 months.  She has a history of hepatitis C status post treatment.  Work-up on 03/02/2013 revealed JAK2 V617F negative, normal iron studies, epo level 23.9 and carboxyhemoglobin of 5%.  She may have sleep apnea.  She undergoes phlebotomy of 300 cc for a hematocrit > 45.  Chest CT on 05/26/2015 revealed a 1.4 cm of ground-glass opacity in the medial right lower lobe likely representing a small focus of infection.  Recommendation was for a follow-up CT thorax in 3 months to demonstrate persistence or resolution given her smoking history.  She had coronary artery calcification.    She has not had a mammogram in years.  She denies any breast concerns.  She has a 6 month history of diarrhea felt secondary to irritable bowel syndrome (IBS).  Upper and lower endoscopy on 12/12/2014 revealed grade a reflux  esophagitis, small hiatal hernia, gastritis with hemorrhage and duodenitis.  Lower endoscopy revealed one 4 mm rectal polyp and non-bleeding internal hemorrhoids.  Biopsies were benign.  Symptomatically, she has chronic fatigue.  Hematocrit is normal.  Plan: 1.  Review chest CT.  Exam normal.  No symptoms.  Anticipate follow-up chest CT in 3 months (08/26/2015) per radiology. 2.  Discuss need for further evaluation if weight loss persists. 3.  Abdominal ultrasound- hepatitis C 4.  Reschedule bilateral diagnostic mammogram and ultrasound. 5.  Follow-up with PCP for possible sleep apnea testing. 6.  Encourage smoking cessation. 7.  Discuss plan for CXR if develops a change in cough or fever. 8.  RTC on 07/04/2015 for MD assess, labs (CBC, AFP), review mammogram and ultrasound, +/- phlebotomy.   Lequita Asal, MD  05/30/2015, 2:38 PM

## 2015-06-01 ENCOUNTER — Encounter: Payer: Self-pay | Admitting: Hematology and Oncology

## 2015-06-06 ENCOUNTER — Ambulatory Visit
Admission: RE | Admit: 2015-06-06 | Discharge: 2015-06-06 | Disposition: A | Discharge status: Home / Self Care | Payer: Commercial Managed Care - HMO | Source: Ambulatory Visit | Attending: Hematology and Oncology | Admitting: Hematology and Oncology

## 2015-06-06 ENCOUNTER — Other Ambulatory Visit: Payer: Self-pay

## 2015-06-06 DIAGNOSIS — D751 Secondary polycythemia: Secondary | ICD-10-CM

## 2015-06-10 ENCOUNTER — Other Ambulatory Visit: Payer: Self-pay | Admitting: *Deleted

## 2015-06-10 ENCOUNTER — Inpatient Hospital Stay
Admission: RE | Admit: 2015-06-10 | Discharge: 2015-06-10 | Disposition: A | Discharge status: Home / Self Care | Payer: Self-pay | Source: Ambulatory Visit | Attending: *Deleted | Admitting: *Deleted

## 2015-06-10 DIAGNOSIS — Z9289 Personal history of other medical treatment: Secondary | ICD-10-CM

## 2015-06-12 ENCOUNTER — Ambulatory Visit
Admission: RE | Admit: 2015-06-12 | Discharge: 2015-06-12 | Disposition: A | Discharge status: Home / Self Care | Payer: Commercial Managed Care - HMO | Source: Ambulatory Visit | Attending: Hematology and Oncology | Admitting: Hematology and Oncology

## 2015-06-12 ENCOUNTER — Other Ambulatory Visit: Payer: Self-pay | Admitting: Hematology and Oncology

## 2015-06-12 DIAGNOSIS — F17200 Nicotine dependence, unspecified, uncomplicated: Secondary | ICD-10-CM

## 2015-06-12 DIAGNOSIS — L723 Sebaceous cyst: Secondary | ICD-10-CM | POA: Diagnosis not present

## 2015-06-12 DIAGNOSIS — N63 Unspecified lump in breast: Secondary | ICD-10-CM | POA: Diagnosis present

## 2015-06-12 DIAGNOSIS — N6002 Solitary cyst of left breast: Secondary | ICD-10-CM | POA: Diagnosis not present

## 2015-07-04 ENCOUNTER — Inpatient Hospital Stay: Payer: Commercial Managed Care - HMO

## 2015-07-04 ENCOUNTER — Inpatient Hospital Stay: Payer: Commercial Managed Care - HMO | Admitting: Hematology and Oncology

## 2015-07-04 ENCOUNTER — Other Ambulatory Visit: Payer: Self-pay | Admitting: Hematology and Oncology

## 2015-07-11 ENCOUNTER — Inpatient Hospital Stay: Payer: Commercial Managed Care - HMO | Attending: Hematology and Oncology

## 2015-07-11 ENCOUNTER — Other Ambulatory Visit: Payer: Self-pay

## 2015-07-11 ENCOUNTER — Inpatient Hospital Stay: Payer: Commercial Managed Care - HMO

## 2015-07-11 ENCOUNTER — Inpatient Hospital Stay (HOSPITAL_BASED_OUTPATIENT_CLINIC_OR_DEPARTMENT_OTHER): Payer: Commercial Managed Care - HMO | Admitting: Hematology and Oncology

## 2015-07-11 VITALS — BP 117/75 | HR 92 | Temp 97.7°F | Resp 18 | Ht <= 58 in | Wt 190.5 lb

## 2015-07-11 DIAGNOSIS — K449 Diaphragmatic hernia without obstruction or gangrene: Secondary | ICD-10-CM | POA: Insufficient documentation

## 2015-07-11 DIAGNOSIS — Z794 Long term (current) use of insulin: Secondary | ICD-10-CM | POA: Insufficient documentation

## 2015-07-11 DIAGNOSIS — K210 Gastro-esophageal reflux disease with esophagitis: Secondary | ICD-10-CM | POA: Diagnosis not present

## 2015-07-11 DIAGNOSIS — B182 Chronic viral hepatitis C: Secondary | ICD-10-CM

## 2015-07-11 DIAGNOSIS — Z79899 Other long term (current) drug therapy: Secondary | ICD-10-CM

## 2015-07-11 DIAGNOSIS — K648 Other hemorrhoids: Secondary | ICD-10-CM | POA: Insufficient documentation

## 2015-07-11 DIAGNOSIS — K2980 Duodenitis without bleeding: Secondary | ICD-10-CM | POA: Diagnosis not present

## 2015-07-11 DIAGNOSIS — B1920 Unspecified viral hepatitis C without hepatic coma: Secondary | ICD-10-CM

## 2015-07-11 DIAGNOSIS — F17210 Nicotine dependence, cigarettes, uncomplicated: Secondary | ICD-10-CM | POA: Diagnosis not present

## 2015-07-11 DIAGNOSIS — D751 Secondary polycythemia: Secondary | ICD-10-CM | POA: Diagnosis not present

## 2015-07-11 DIAGNOSIS — K589 Irritable bowel syndrome without diarrhea: Secondary | ICD-10-CM | POA: Insufficient documentation

## 2015-07-11 DIAGNOSIS — E119 Type 2 diabetes mellitus without complications: Secondary | ICD-10-CM | POA: Diagnosis not present

## 2015-07-11 DIAGNOSIS — Z23 Encounter for immunization: Secondary | ICD-10-CM | POA: Insufficient documentation

## 2015-07-11 DIAGNOSIS — I10 Essential (primary) hypertension: Secondary | ICD-10-CM | POA: Diagnosis not present

## 2015-07-11 DIAGNOSIS — R918 Other nonspecific abnormal finding of lung field: Secondary | ICD-10-CM | POA: Insufficient documentation

## 2015-07-11 DIAGNOSIS — K621 Rectal polyp: Secondary | ICD-10-CM | POA: Insufficient documentation

## 2015-07-11 DIAGNOSIS — R9389 Abnormal findings on diagnostic imaging of other specified body structures: Secondary | ICD-10-CM

## 2015-07-11 LAB — CBC WITH DIFFERENTIAL/PLATELET
Basophils Absolute: 0.1 10*3/uL (ref 0–0.1)
Basophils Relative: 1 %
Eosinophils Absolute: 0.2 10*3/uL (ref 0–0.7)
Eosinophils Relative: 2 %
HCT: 44.5 % (ref 35.0–47.0)
Hemoglobin: 14.9 g/dL (ref 12.0–16.0)
Lymphocytes Relative: 25 %
Lymphs Abs: 2.3 10*3/uL (ref 1.0–3.6)
MCH: 29.1 pg (ref 26.0–34.0)
MCHC: 33.5 g/dL (ref 32.0–36.0)
MCV: 86.9 fL (ref 80.0–100.0)
Monocytes Absolute: 0.6 10*3/uL (ref 0.2–0.9)
Monocytes Relative: 6 %
Neutro Abs: 6.2 10*3/uL (ref 1.4–6.5)
Neutrophils Relative %: 66 %
Platelets: 323 10*3/uL (ref 150–440)
RBC: 5.12 MIL/uL (ref 3.80–5.20)
RDW: 14.3 % (ref 11.5–14.5)
WBC: 9.4 10*3/uL (ref 3.6–11.0)

## 2015-07-11 MED ORDER — INFLUENZA VAC SPLIT QUAD 0.5 ML IM SUSY
0.5000 mL | PREFILLED_SYRINGE | Freq: Once | INTRAMUSCULAR | Status: AC
  Administered 2015-07-11: 0.5 mL via INTRAMUSCULAR
  Filled 2015-07-11: qty 0.5

## 2015-07-11 NOTE — Progress Notes (Signed)
[facility] day:  07/11/2015   Chief Complaint: YER GARINGER is a 55 y.o. female with secondary polycythemia who is seen for review of interval studies and 1 month assessment.  HPI:   The patient was last seen in the medical oncology clinic on 05/30/2015.  At that time, she noted chronic fatigue.  Hematocrit was normal.  Chest CT from 05/26/2015 was reviewed with plan for a follow-up scan in 3 months per radiology.  An abdominal ultrasound was ordered secondary to her hepatitis C.  Bilateral mammograms were ordered (none in years).  She was to follow-up with PCP for possible sleep apnea testing.  Mammogram on 06/12/2015 revealed no evidence of malignancy.  Ultrasound of the right breast revealed a benign 0.6 cm sebaceous cyst in the low left axilla at the site of palpable concern.  Recommendation was for mammogram in 1 year.  During the interim, she has gained 7 pounds.  She is eating better.  She is on Viberzi for her irritable bowel.  Symptoms have improved.    She notes arthritis pain.  She states that she has not seen her PCP during the interim.  She does not feel rested when she wakes up.  She forgot her right upper quadrant ultrasound.   Past Medical History  Diagnosis Date  . Diabetes mellitus without complication ([place])   . Hypertension   . Polycythemia     Past Surgical History  Procedure Laterality Date  . Cholecystectomy    . Cesarean section    . Colonoscopy N/A 12/12/2014    Procedure: COLONOSCOPY;  Surgeon: Lucilla Lame, MD;  Location[place];  Service: Gastroenterology;  Laterality: N/A;  . Esophagogastroduodenoscopy N/A 12/12/2014    Procedure: ESOPHAGOGASTRODUODENOSCOPY (EGD);  Surgeon: Lucilla Lame, MD;  Location: [place];  Service: Gastroenterology;  Laterality: N/A;  . Polypectomy  12/12/2014    Procedure: POLYPECTOMY;  Surgeon: Lucilla Lame, MD;  Location: [place];  Service:  Gastroenterology;;    Family History  Problem Relation Age of Onset  . Stroke Mother   . Hypertension Mother   . Hypertension Father   . Stroke Father   . Heart disease Sister     Social History:  reports that she has been smoking Cigarettes.  She has been smoking about 0.50 packs per day. She has never used smokeless tobacco. She reports that she drinks about 0.6 oz of alcohol per week. She reports that she uses illicit drugs (Marijuana) about twice per week.  She is smoking 1/2 pack per day.  The patient is accompanied by her boyfriend, Wille Glaser, today.  Allergies:  Allergies  Allergen Reactions  . Morphine Sulfate Itching    Current Medications: Current Outpatient Prescriptions  Medication Sig Dispense Refill  . ACCU-CHEK SMARTVIEW test strip as directed.    Marland Kitchen albuterol (PROVENTIL HFA;VENTOLIN HFA) 108 (90 BASE) MCG/ACT inhaler Inhale 2 puffs into the lungs every 6 (six) hours as needed for wheezing or shortness of breath. 1 Inhaler 2  . Eluxadoline (VIBERZI) 75 MG TABS Take 75 mg by mouth 2 (two) times daily. 60 tablet 5  . FLUoxetine (PROZAC) 40 MG capsule Take 1 capsule (40 mg total) by mouth daily. 90 capsule 1  . glipiZIDE (GLUCOTROL) 5 MG tablet Take 0.5 tablets (2.5 mg total) by mouth daily before breakfast. 90 tablet 0  . Incontinence Supply Disposable (BLADDER CONTROL PADS EX ABSORB) MISC daily.    . insulin glargine (LANTUS) 100 UNIT/ML injection  Inject 0.1 mLs (10 Units total) into the skin at bedtime. 10 mL 11  . Insulin Pen Needle (BD PEN NEEDLE NANO U/F) 32G X 4 MM MISC 1 Units by Does not apply route once. One needle per injection 100 each 3  . losartan (COZAAR) 100 MG tablet Take 1 tablet (100 mg total) by mouth daily. 30 tablet 1  . metaxalone (SKELAXIN) 800 MG tablet Take 1 tablet (800 mg total) by mouth 3 (three) times daily as needed for muscle spasms. 90 tablet 0  . Multiple Vitamins-Minerals (WOMENS MULTIVITAMIN PLUS PO) daily.    . pantoprazole (PROTONIX) 40 MG  tablet Take 1 tablet (40 mg total) by mouth daily before breakfast. 30 tablet 11   No current facility-administered medications for this visit.    Review of Systems:  GENERAL:  Feels about the same.  No fevers or sweats.  Weight up 7 pounds. PERFORMANCE STATUS (ECOG):  1 HEENT:  No visual changes, runny nose, sore throat, mouth sores or tenderness. Lungs: No shortness of breath or cough.  No hemoptysis.  Possible sleep apnea (does not feel rested in AM). Cardiac:  No chest pain, palpitations, orthopnea, or PND. GI:  Irritable bowel, improved on Viberzi.  No nausea, vomiting, constipation, melena or hematochezia. GU:  No urgency, frequency, dysuria, or hematuria. Musculoskeletal:  Arthritis.  Back and hip pain.  No muscle tenderness. Extremities:  No pain or swelling. Skin:  No rashes or skin changes. Neuro:  Tingling in fingers and toes secondary to diabetes.  No weakness, balance or coordination issues. Endocrine: Diabetes.  No thyroid issues, hot flashes or night sweats. Psych:  Stressed.  No mood changes, depression or anxiety. Pain:  No focal pain. Review of systems:  All other systems reviewed and found to be negative.  Physical Exam: Blood pressure 117/75, pulse 92, temperature 97.7 F (36.5 C), temperature source Tympanic, resp. rate 18, height 4\' 10"  (1.473 m), weight 190 lb 7.6 oz (86.4 kg). GENERAL:  Heavy set woman sitting comfortably in the exam room in no acute distress. MENTAL STATUS:  Alert and oriented to person, place and time. HEAD:  Long brown hair with pony tail.  Normocephalic, atraumatic, face symmetric, no Cushingoid features. EYES:  Glasses.  Brown eyes.  Pupils equal round and reactive to light and accomodation.  No conjunctivitis or scleral icterus. ENT:  Oropharynx clear without lesion.  Upper dentures.  Tongue normal. Mucous membranes moist.  RESPIRATORY:  Clear to auscultation without rales, wheezes or rhonchi. CARDIOVASCULAR:  Regular rate and rhythm  without murmur, rub or gallop. ABDOMEN:  Fully round.  Soft, non-tender, with active bowel sounds, and no appreciable hepatosplenomegaly.  No masses. SKIN:  Nails chewed.  No rashes, ulcers or lesions. EXTREMITIES: No edema, no skin discoloration or tenderness.  No palpable cords. LYMPH NODES: No palpable cervical, supraclavicular, axillary or inguinal adenopathy  NEUROLOGICAL: Unremarkable. PSYCH:  Appropriate.   Appointment on 07/11/2015  Component Date Value Ref Range Status  . WBC 07/11/2015 9.4  3.6 - 11.0 K/uL Final  . RBC 07/11/2015 5.12  3.80 - 5.20 MIL/uL Final  . Hemoglobin 07/11/2015 14.9  12.0 - 16.0 g/dL Final  . HCT 07/11/2015 44.5  35.0 - 47.0 % Final  . MCV 07/11/2015 86.9  80.0 - 100.0 fL Final  . MCH 07/11/2015 29.1  26.0 - 34.0 pg Final  . MCHC 07/11/2015 33.5  32.0 - 36.0 g/dL Final  . RDW 07/11/2015 14.3  11.5 - 14.5 % Final  . Platelets 07/11/2015 323  150 - 440 K/uL Final  . Neutrophils Relative % 07/11/2015 66   Final  . Neutro Abs 07/11/2015 6.2  1.4 - 6.5 K/uL Final  . Lymphocytes Relative 07/11/2015 25   Final  . Lymphs Abs 07/11/2015 2.3  1.0 - 3.6 K/uL Final  . Monocytes Relative 07/11/2015 6   Final  . Monocytes Absolute 07/11/2015 0.6  0.2 - 0.9 K/uL Final  . Eosinophils Relative 07/11/2015 2   Final  . Eosinophils Absolute 07/11/2015 0.2  0 - 0.7 K/uL Final  . Basophils Relative 07/11/2015 1   Final  . Basophils Absolute 07/11/2015 0.1  0 - 0.1 K/uL Final    Assessment:  JANANN CONTOIS is a 55 y.o. female with secondary erythrocytosis secondary to smoking and COPD.  She has a 50-70 pack year smoking history.  She lost 49 pounds in the past 6 months, but gained 7 pounds over the holidays.  She has a history of hepatitis C status post treatment.  Work-up on 03/02/2013 revealed JAK2 V617F negative, normal iron studies, epo level 23.9 and carboxyhemoglobin of 5%.  She may have sleep apnea.  She undergoes phlebotomy of 300 cc for a hematocrit >  45.  Chest CT on 05/26/2015 revealed a 1.4 cm of ground-glass opacity in the medial right lower lobe likely representing a small focus of infection.  Recommendation was for a follow-up CT chest in 3 months to demonstrate persistence or resolution given her smoking history.  She had coronary artery calcification.    Mammogram on 06/12/2015 revealed no evidence of malignancy.  Ultrasound of the right breast revealed a benign 0.6 cm sebaceous cyst in the low left axilla.  She denies any breast concerns.  Irritable bowel syndrome (IBS) has improved on Viberzi.  Upper and lower endoscopy on 12/12/2014 revealed grade a reflux esophagitis, small hiatal hernia, gastritis with hemorrhage and duodenitis.  Lower endoscopy revealed one 4 mm rectal polyp and non-bleeding internal hemorrhoids.  Biopsies were benign.  Symptomatically, she has chronic fatigue.  Hematocrit is stable.  She is smoking 1/2 ppd.  Plan: 1.  Labs today:  CBC with diff, AFP. 2.  Review mammogram and breast ultrasound-done. 3.  No phlebotomy today. 4.  Reshedule RUQ ultrasound. 5.  Schedule follow-up chest CT (08/26/2015) per radiology. 6.  Follow-up with PCP for possible sleep apnea testing. 7.  Encourage smoking cessation . 8.  Influenza vaccine today (patient requested). 9.  RTC on 08/29/2015 for MD assess, labs (CBC with diff, ferritin), review RUQ ultrasound and chest CT, +/- phlebotomy.   Lequita Asal, MD  07/11/2015, 2:44 PM

## 2015-07-12 LAB — AFP TUMOR MARKER: AFP-Tumor Marker: 6.5 ng/mL (ref 0.0–8.3)

## 2015-07-16 ENCOUNTER — Ambulatory Visit: Payer: Commercial Managed Care - HMO

## 2015-07-17 ENCOUNTER — Other Ambulatory Visit: Payer: Self-pay | Admitting: Family Medicine

## 2015-07-23 NOTE — Telephone Encounter (Signed)
Medication refill has been refused due to patient needs to schedule medication refill appointment she has not been seen in office since 02/18/2015

## 2015-07-24 ENCOUNTER — Other Ambulatory Visit: Payer: Self-pay | Admitting: Family Medicine

## 2015-08-05 ENCOUNTER — Encounter: Payer: Self-pay | Admitting: Hematology and Oncology

## 2015-08-07 ENCOUNTER — Encounter: Payer: Self-pay | Admitting: Family Medicine

## 2015-08-07 ENCOUNTER — Ambulatory Visit (INDEPENDENT_AMBULATORY_CARE_PROVIDER_SITE_OTHER): Payer: Commercial Managed Care - HMO | Admitting: Family Medicine

## 2015-08-07 VITALS — BP 118/70 | HR 102 | Temp 98.5°F | Resp 20 | Ht <= 58 in | Wt 184.0 lb

## 2015-08-07 DIAGNOSIS — R3915 Urgency of urination: Secondary | ICD-10-CM

## 2015-08-07 DIAGNOSIS — Z794 Long term (current) use of insulin: Secondary | ICD-10-CM | POA: Diagnosis not present

## 2015-08-07 DIAGNOSIS — F329 Major depressive disorder, single episode, unspecified: Secondary | ICD-10-CM

## 2015-08-07 DIAGNOSIS — I10 Essential (primary) hypertension: Secondary | ICD-10-CM

## 2015-08-07 DIAGNOSIS — E1165 Type 2 diabetes mellitus with hyperglycemia: Secondary | ICD-10-CM | POA: Diagnosis not present

## 2015-08-07 DIAGNOSIS — E785 Hyperlipidemia, unspecified: Secondary | ICD-10-CM

## 2015-08-07 DIAGNOSIS — E1169 Type 2 diabetes mellitus with other specified complication: Secondary | ICD-10-CM | POA: Diagnosis not present

## 2015-08-07 DIAGNOSIS — M5416 Radiculopathy, lumbar region: Secondary | ICD-10-CM

## 2015-08-07 DIAGNOSIS — M5410 Radiculopathy, site unspecified: Secondary | ICD-10-CM

## 2015-08-07 DIAGNOSIS — IMO0001 Reserved for inherently not codable concepts without codable children: Secondary | ICD-10-CM

## 2015-08-07 DIAGNOSIS — F32A Depression, unspecified: Secondary | ICD-10-CM

## 2015-08-07 DIAGNOSIS — G8929 Other chronic pain: Secondary | ICD-10-CM

## 2015-08-07 MED ORDER — INSULIN GLARGINE 100 UNIT/ML ~~LOC~~ SOLN: 40.0000 [IU] | Freq: Every day | SUBCUTANEOUS | Status: DC

## 2015-08-07 MED ORDER — FLUOXETINE HCL 40 MG PO CAPS: 40.0000 mg | ORAL_CAPSULE | Freq: Every day | ORAL | Status: DC

## 2015-08-07 MED ORDER — CYCLOBENZAPRINE HCL 10 MG PO TABS: 10.0000 mg | ORAL_TABLET | Freq: Every day | ORAL | Status: DC

## 2015-08-07 MED ORDER — GLIPIZIDE 5 MG PO TABS: 5.0000 mg | ORAL_TABLET | Freq: Every day | ORAL | Status: DC

## 2015-08-07 MED ORDER — LOSARTAN POTASSIUM 100 MG PO TABS: 100.0000 mg | ORAL_TABLET | Freq: Every day | ORAL | Status: DC

## 2015-08-07 NOTE — Progress Notes (Signed)
Name: Tara Cochran   MRN: [id number]    DOB: 11-07-1960   Date:08/07/2015       Progress Note  Subjective  Chief Complaint  Chief Complaint  Patient presents with  . Follow-up    Diabetes  . Diabetes  . Hyperlipidemia    Diabetes She presents for her follow-up diabetic visit. She has type 2 diabetes mellitus. Her disease course has been stable. There are no hypoglycemic associated symptoms. Pertinent negatives for diabetes include no chest pain, no fatigue, no foot paresthesias, no polydipsia and no polyuria. Symptoms are stable. Current diabetic treatment includes oral agent (monotherapy) and insulin injections. She is following a diabetic diet. Her breakfast blood glucose range is generally 180-200 mg/dl. An ACE inhibitor/angiotensin II receptor blocker is being taken.  Hyperlipidemia This is a chronic problem. The problem is uncontrolled. Lipid results: Elevated TG. Pertinent negatives include no chest pain, myalgias or shortness of breath. She is currently on no antihyperlipidemic treatment.   Urinary Urgency Has noticed that when she first wakes up in the morning, she has to rush to the bathroom and sometimes notices a small amount of urine leakage. Has also noticed increased frequency of urination. No fevers. Past Medical History  Diagnosis Date  . Diabetes mellitus without complication ([place])   . Hypertension   . Polycythemia     Past Surgical History  Procedure Laterality Date  . Cholecystectomy    . Cesarean section    . Colonoscopy N/A 12/12/2014    Procedure: COLONOSCOPY;  Surgeon: Lucilla Lame, MD;  Location: [place];  Service: Gastroenterology;  Laterality: N/A;  . Esophagogastroduodenoscopy N/A 12/12/2014    Procedure: ESOPHAGOGASTRODUODENOSCOPY (EGD);  Surgeon: Lucilla Lame, MD;  Location: [place];  Service: Gastroenterology;  Laterality: N/A;  . Polypectomy  12/12/2014    Procedure: POLYPECTOMY;  Surgeon: Lucilla Lame, MD;  Location: [place];  Service: Gastroenterology;;    Family History  Problem Relation Age of Onset  . Stroke Mother   . Hypertension Mother   . Hypertension Father   . Stroke Father   . Heart disease Sister     Social History   Social History  . Marital Status: Divorced    Spouse Name: N/A  . Number of Children: N/A  . Years of Education: N/A   Occupational History  . Not on file.   Social History Main Topics  . Smoking status: Current Every Day Smoker -- 0.50 packs/day    Types: Cigarettes  . Smokeless tobacco: Never Used  . Alcohol Use: 0.6 oz/week    1 Glasses of wine per week  . Drug Use: 2.00 per week    Special: Marijuana  . Sexual Activity: Not on file   Other Topics Concern  . Not on file   Social History Narrative     Current outpatient prescriptions:  .  ACCU-CHEK SMARTVIEW test strip, as directed., Disp: , Rfl:  .  albuterol (PROVENTIL HFA;VENTOLIN HFA) 108 (90 BASE) MCG/ACT inhaler, Inhale 2 puffs into the lungs every 6 (six) hours as needed for wheezing or shortness of breath., Disp: 1 Inhaler, Rfl: 2 .  Eluxadoline (VIBERZI) 75 MG TABS, Take 75 mg by mouth 2 (two) times daily., Disp: 60 tablet, Rfl: 5 .  FLUoxetine (PROZAC) 40 MG capsule, Take 1 capsule (40 mg total) by mouth daily., Disp: 90 capsule, Rfl: 1 .  glipiZIDE (GLUCOTROL) 5 MG tablet, Take 0.5 tablets (2.5 mg total) by mouth daily before breakfast., Disp: 90 tablet, Rfl: 0 .  Incontinence Supply Disposable (BLADDER CONTROL PADS EX ABSORB) MISC, daily., Disp: , Rfl:  .  insulin glargine (LANTUS) 100 UNIT/ML injection, Inject 0.1 mLs (10 Units total) into the skin at bedtime., Disp: 10 mL, Rfl: 11 .  Insulin Pen Needle (BD PEN NEEDLE NANO U/F) 32G X 4 MM MISC, 1 Units by Does not apply route once. One needle per injection, Disp: 100 each, Rfl: 3 .  losartan (COZAAR) 100 MG tablet, Take 1 tablet (100 mg total) by mouth daily., Disp: 30 tablet, Rfl: 1 .  metaxalone (SKELAXIN) 800 MG tablet, Take 1  tablet (800 mg total) by mouth 3 (three) times daily as needed for muscle spasms., Disp: 90 tablet, Rfl: 0 .  Multiple Vitamins-Minerals (WOMENS MULTIVITAMIN PLUS PO), daily., Disp: , Rfl:  .  pantoprazole (PROTONIX) 40 MG tablet, Take 1 tablet (40 mg total) by mouth daily before breakfast., Disp: 30 tablet, Rfl: 11  Allergies  Allergen Reactions  . Morphine Sulfate Itching     Review of Systems  Constitutional: Negative for fatigue.  Respiratory: Negative for shortness of breath.   Cardiovascular: Negative for chest pain.  Musculoskeletal: Negative for myalgias.  Endo/Heme/Allergies: Negative for polydipsia.     Objective  Filed Vitals:   08/07/15 1119  BP: 118/70  Pulse: 102  Temp: 98.5 F (36.9 C)  TempSrc: Oral  Resp: 20  Height: 4\' 10"  (1.473 m)  Weight: 184 lb (83.462 kg)  SpO2: 97%    Physical Exam  Constitutional: She is oriented to person, place, and time and well-developed, well-nourished, and in no distress.  HENT:  Head: Normocephalic and atraumatic.  Cardiovascular: Normal rate and regular rhythm.   Pulmonary/Chest: Effort normal. She has wheezes (expiratory wheezes in left lung).  Abdominal: Soft. Bowel sounds are normal. There is no tenderness.  Neurological: She is alert and oriented to person, place, and time.  Nursing note and vitals reviewed.    Assessment & Plan  1. Dyslipidemia associated with type 2 diabetes mellitus ([place]) Not on statin therapy. Repeat FLP today. - Lipid Profile - Comprehensive Metabolic Panel (CMET)  2. Uncontrolled type 2 diabetes mellitus without complication, with long-term current use of insulin ([facility]) Increase Lantus to 40 units at bedtime, obtain A1c today and follow-up. - POCT HgB A1C - POCT Glucose (CBG) - glipiZIDE (GLUCOTROL) 5 MG tablet; Take 1 tablet (5 mg total) by mouth daily before breakfast.  Dispense: 90 tablet; Refill: 0 - insulin glargine (LANTUS) 100 UNIT/ML injection; Inject 0.4 mLs (40 Units total)  into the skin at bedtime.  Dispense: 10 mL; Refill: 11  3. Urinary urgency Obtain urinalysis and culture to rule out UTI. - Urine Culture - Urinalysis, Routine w reflex microscopic  4. Essential hypertension BP at goal. Continue present ARB therapy - losartan (COZAAR) 100 MG tablet; Take 1 tablet (100 mg total) by mouth daily.  Dispense: 90 tablet; Refill: 1  5. Depression  - FLUoxetine (PROZAC) 40 MG capsule; Take 1 capsule (40 mg total) by mouth daily.  Dispense: 90 capsule; Refill: 1  6. Chronic radicular low back pain Wishes to be restarted on cyclobenzaprine and milligrams daily for chronic low back pain. - cyclobenzaprine (FLEXERIL) 10 MG tablet; Take 1 tablet (10 mg total) by mouth at bedtime.  Dispense: 30 tablet; Refill: 0    Vasilia Dise Asad A. [facility] 08/07/2015 12:26 PM

## 2015-08-08 ENCOUNTER — Telehealth: Payer: Self-pay | Admitting: Internal Medicine

## 2015-08-08 NOTE — Telephone Encounter (Signed)
Pt is asking what strength of FISH OIL you are wanting her to take. Looked at her encounter but did not see any documentaion on this.

## 2015-08-10 NOTE — Telephone Encounter (Signed)
We will discuss after reviewing her lipid panel.

## 2015-08-11 DIAGNOSIS — E785 Hyperlipidemia, unspecified: Secondary | ICD-10-CM | POA: Diagnosis not present

## 2015-08-11 DIAGNOSIS — R3915 Urgency of urination: Secondary | ICD-10-CM | POA: Diagnosis not present

## 2015-08-11 DIAGNOSIS — E1169 Type 2 diabetes mellitus with other specified complication: Secondary | ICD-10-CM | POA: Diagnosis not present

## 2015-08-12 ENCOUNTER — Telehealth: Payer: Self-pay | Admitting: Internal Medicine

## 2015-08-12 LAB — MICROSCOPIC EXAMINATION: Casts: NONE SEEN /lpf

## 2015-08-12 LAB — URINALYSIS, ROUTINE W REFLEX MICROSCOPIC
Bilirubin, UA: NEGATIVE
GLUCOSE, UA: NEGATIVE
KETONES UA: NEGATIVE
NITRITE UA: NEGATIVE
RBC, UA: NEGATIVE
SPEC GRAV UA: 1.023 (ref 1.005–1.030)
UUROB: 0.2 mg/dL (ref 0.2–1.0)
pH, UA: 6 (ref 5.0–7.5)

## 2015-08-12 LAB — COMPREHENSIVE METABOLIC PANEL
A/G RATIO: 1.2 (ref 1.1–2.5)
ALT: 16 IU/L (ref 0–32)
AST: 12 IU/L (ref 0–40)
Albumin: 3.9 g/dL (ref 3.5–5.5)
Alkaline Phosphatase: 171 IU/L — ABNORMAL HIGH (ref 39–117)
BUN/Creatinine Ratio: 15 (ref 9–23)
BUN: 10 mg/dL (ref 6–24)
Bilirubin Total: 0.5 mg/dL (ref 0.0–1.2)
CALCIUM: 9.6 mg/dL (ref 8.7–10.2)
CO2: 24 mmol/L (ref 18–29)
CREATININE: 0.65 mg/dL (ref 0.57–1.00)
Chloride: 102 mmol/L (ref 96–106)
GFR, EST AFRICAN AMERICAN: 117 mL/min/{1.73_m2} (ref >59)
GFR, EST NON AFRICAN AMERICAN: 102 mL/min/{1.73_m2} (ref >59)
GLOBULIN, TOTAL: 3.2 g/dL (ref 1.5–4.5)
Glucose: 128 mg/dL — ABNORMAL HIGH (ref 65–99)
POTASSIUM: 4.3 mmol/L (ref 3.5–5.2)
SODIUM: 143 mmol/L (ref 134–144)
TOTAL PROTEIN: 7.1 g/dL (ref 6.0–8.5)

## 2015-08-12 LAB — LIPID PANEL
CHOLESTEROL TOTAL: 226 mg/dL — AB (ref 100–199)
Chol/HDL Ratio: 4.2 ratio units (ref 0.0–4.4)
HDL: 54 mg/dL (ref >39)
LDL Calculated: 131 mg/dL — ABNORMAL HIGH (ref 0–99)
TRIGLYCERIDES: 204 mg/dL — AB (ref 0–149)
VLDL Cholesterol Cal: 41 mg/dL — ABNORMAL HIGH (ref 5–40)

## 2015-08-12 LAB — URINE CULTURE

## 2015-08-12 NOTE — Telephone Encounter (Signed)
Please inform patient of lab results. If she has any questions, I will be happy to answer.

## 2015-08-12 NOTE — Telephone Encounter (Signed)
Requesting test results.

## 2015-08-13 ENCOUNTER — Telehealth: Payer: Self-pay | Admitting: Internal Medicine

## 2015-08-13 NOTE — Telephone Encounter (Signed)
Pt would like a call back about her urine results.

## 2015-08-14 NOTE — Telephone Encounter (Signed)
Lab results have been reported to patient  

## 2015-08-21 ENCOUNTER — Telehealth: Payer: Self-pay

## 2015-08-21 MED ORDER — SYRINGE (DISPOSABLE) 5 ML MISC: 40.0000 [IU] | Freq: Every day | Status: DC

## 2015-08-21 NOTE — Telephone Encounter (Signed)
Insulin syringes has been sent to [facility]

## 2015-08-26 ENCOUNTER — Ambulatory Visit: Payer: Commercial Managed Care - HMO

## 2015-08-29 ENCOUNTER — Inpatient Hospital Stay: Payer: Commercial Managed Care - HMO | Attending: Hematology and Oncology | Admitting: Hematology and Oncology

## 2015-08-29 ENCOUNTER — Inpatient Hospital Stay: Payer: Commercial Managed Care - HMO

## 2015-08-29 ENCOUNTER — Other Ambulatory Visit: Payer: Self-pay | Admitting: Hematology and Oncology

## 2015-08-29 ENCOUNTER — Inpatient Hospital Stay: Payer: Commercial Managed Care - HMO | Attending: Hematology and Oncology

## 2015-09-02 ENCOUNTER — Other Ambulatory Visit: Payer: Self-pay

## 2015-09-02 DIAGNOSIS — M5416 Radiculopathy, lumbar region: Principal | ICD-10-CM

## 2015-09-02 DIAGNOSIS — G8929 Other chronic pain: Secondary | ICD-10-CM

## 2015-09-02 NOTE — Telephone Encounter (Signed)
Routed to Dr. Shah for approval 

## 2015-09-03 MED ORDER — CYCLOBENZAPRINE HCL 10 MG PO TABS: 10.0000 mg | ORAL_TABLET | Freq: Every day | ORAL | Status: DC

## 2015-10-07 ENCOUNTER — Other Ambulatory Visit: Payer: Self-pay | Admitting: Family Medicine

## 2015-10-07 NOTE — Telephone Encounter (Signed)
Medication has been refilled and sent to [facility] °

## 2015-10-09 ENCOUNTER — Other Ambulatory Visit: Payer: Self-pay

## 2015-10-09 DIAGNOSIS — K580 Irritable bowel syndrome with diarrhea: Secondary | ICD-10-CM

## 2015-10-09 MED ORDER — ELUXADOLINE 75 MG PO TABS: 75.0000 mg | ORAL_TABLET | Freq: Two times a day (BID) | ORAL | Status: DC

## 2015-10-10 ENCOUNTER — Other Ambulatory Visit: Payer: Commercial Managed Care - HMO

## 2015-10-10 ENCOUNTER — Ambulatory Visit: Payer: Commercial Managed Care - HMO | Admitting: Hematology and Oncology

## 2015-10-24 ENCOUNTER — Inpatient Hospital Stay: Payer: Commercial Managed Care - HMO

## 2015-10-31 ENCOUNTER — Other Ambulatory Visit: Payer: Self-pay | Admitting: Family Medicine

## 2015-11-03 ENCOUNTER — Other Ambulatory Visit: Payer: Self-pay | Admitting: Family Medicine

## 2015-11-04 ENCOUNTER — Ambulatory Visit (INDEPENDENT_AMBULATORY_CARE_PROVIDER_SITE_OTHER): Payer: Commercial Managed Care - HMO | Admitting: Family Medicine

## 2015-11-04 ENCOUNTER — Encounter: Payer: Self-pay | Admitting: Family Medicine

## 2015-11-04 VITALS — BP 118/64 | HR 97 | Temp 98.0°F | Resp 16 | Ht <= 58 in | Wt 190.9 lb

## 2015-11-04 DIAGNOSIS — Z794 Long term (current) use of insulin: Secondary | ICD-10-CM | POA: Diagnosis not present

## 2015-11-04 DIAGNOSIS — I10 Essential (primary) hypertension: Secondary | ICD-10-CM | POA: Diagnosis not present

## 2015-11-04 DIAGNOSIS — R748 Abnormal levels of other serum enzymes: Secondary | ICD-10-CM | POA: Insufficient documentation

## 2015-11-04 DIAGNOSIS — E1169 Type 2 diabetes mellitus with other specified complication: Secondary | ICD-10-CM | POA: Diagnosis not present

## 2015-11-04 DIAGNOSIS — E119 Type 2 diabetes mellitus without complications: Secondary | ICD-10-CM | POA: Diagnosis not present

## 2015-11-04 DIAGNOSIS — E785 Hyperlipidemia, unspecified: Secondary | ICD-10-CM | POA: Diagnosis not present

## 2015-11-04 LAB — POCT GLYCOSYLATED HEMOGLOBIN (HGB A1C): Hemoglobin A1C: 6.5

## 2015-11-04 LAB — GLUCOSE, POCT (MANUAL RESULT ENTRY): POC Glucose: 166 mg/dl — AB (ref 70–99)

## 2015-11-04 MED ORDER — EMPAGLIFLOZIN 10 MG PO TABS: 10.0000 mg | ORAL_TABLET | Freq: Every day | ORAL | Status: DC

## 2015-11-04 NOTE — Progress Notes (Signed)
Name: Tara Cochran   MRN: [id number]    DOB: 1961/02/12   Date:11/04/2015       Progress Note  Subjective  Chief Complaint  Chief Complaint  Patient presents with  . Diabetes    3 month follow up, BS 135 at home  . Hypertension    Diabetes She presents for her follow-up diabetic visit. She has type 2 diabetes mellitus. Her disease course has been stable. Pertinent negatives for hypoglycemia include no headaches. Pertinent negatives for diabetes include no blurred vision, no chest pain, no fatigue, no polydipsia and no polyuria. Symptoms are stable. Pertinent negatives for diabetic complications include no CVA. Current diabetic treatment includes oral agent (monotherapy) and intensive insulin program. Her breakfast blood glucose range is generally 130-140 mg/dl. An ACE inhibitor/angiotensin II receptor blocker is being taken.  Hypertension This is a chronic problem. The problem is controlled. Pertinent negatives include no blurred vision, chest pain, headaches, palpitations or shortness of breath. Risk factors for coronary artery disease include obesity. Past treatments include angiotensin blockers. There is no history of kidney disease, CAD/MI or CVA.  Hyperlipidemia This is a chronic problem. The problem is uncontrolled. Recent lipid tests were reviewed and are high. Exacerbating diseases include obesity. Pertinent negatives include no chest pain or shortness of breath.    Past Medical History  Diagnosis Date  . Diabetes mellitus without complication ([place])   . Hypertension   . Polycythemia     Past Surgical History  Procedure Laterality Date  . Cholecystectomy    . Cesarean section    . Colonoscopy N/A 12/12/2014    Procedure: COLONOSCOPY;  Surgeon: Lucilla Lame, MD;  Location: [place];  Service: Gastroenterology;  Laterality: N/A;  . Esophagogastroduodenoscopy N/A 12/12/2014    Procedure: ESOPHAGOGASTRODUODENOSCOPY (EGD);  Surgeon: Lucilla Lame, MD;  Location:  [place];  Service: Gastroenterology;  Laterality: N/A;  . Polypectomy  12/12/2014    Procedure: POLYPECTOMY;  Surgeon: Lucilla Lame, MD;  Location: [place];  Service: Gastroenterology;;    Family History  Problem Relation Age of Onset  . Stroke Mother   . Hypertension Mother   . Hypertension Father   . Stroke Father   . Heart disease Sister     Social History   Social History  . Marital Status: Divorced    Spouse Name: N/A  . Number of Children: N/A  . Years of Education: N/A   Occupational History  . Not on file.   Social History Main Topics  . Smoking status: Current Every Day Smoker -- 0.50 packs/day    Types: Cigarettes  . Smokeless tobacco: Never Used  . Alcohol Use: 0.6 oz/week    1 Glasses of wine per week  . Drug Use: 2.00 per week    Special: Marijuana  . Sexual Activity: Not on file   Other Topics Concern  . Not on file   Social History Narrative     Current outpatient prescriptions:  .  ACCU-CHEK SMARTVIEW test strip, as directed., Disp: , Rfl:  .  albuterol (VENTOLIN HFA) 108 (90 Base) MCG/ACT inhaler, Inhale 2 puffs into the lungs every 6 (six) hours as needed for wheezing or shortness of breath., Disp: 18 g, Rfl: 1 .  cyclobenzaprine (FLEXERIL) 10 MG tablet, Take 1 tablet (10 mg total) by mouth at bedtime., Disp: 30 tablet, Rfl: 2 .  Eluxadoline (VIBERZI) 75 MG TABS, Take 75 mg by mouth 2 (two) times daily., Disp: 60 tablet, Rfl: 5 .  empagliflozin (JARDIANCE)  10 MG TABS tablet, Take 10 mg by mouth daily., Disp: 30 tablet, Rfl: 2 .  FLUoxetine (PROZAC) 40 MG capsule, TAKE 1 CAPSULE BY MOUTH ONCE DAILY, Disp: 90 capsule, Rfl: 0 .  glipiZIDE (GLUCOTROL) 5 MG tablet, Take 1 tablet (5 mg total) by mouth daily before breakfast., Disp: 90 tablet, Rfl: 0 .  Incontinence Supply Disposable (BLADDER CONTROL PADS EX ABSORB) MISC, daily., Disp: , Rfl:  .  insulin glargine (LANTUS) 100 UNIT/ML injection, Inject 0.4 mLs (40 Units total) into  the skin at bedtime., Disp: 10 mL, Rfl: 11 .  Insulin Pen Needle (BD PEN NEEDLE NANO U/F) 32G X 4 MM MISC, 1 Units by Does not apply route once. One needle per injection, Disp: 100 each, Rfl: 3 .  losartan (COZAAR) 100 MG tablet, Take 1 tablet (100 mg total) by mouth daily., Disp: 90 tablet, Rfl: 1 .  metaxalone (SKELAXIN) 800 MG tablet, Take 1 tablet (800 mg total) by mouth 3 (three) times daily as needed for muscle spasms., Disp: 90 tablet, Rfl: 0 .  Multiple Vitamins-Minerals (WOMENS MULTIVITAMIN PLUS PO), daily., Disp: , Rfl:  .  pantoprazole (PROTONIX) 40 MG tablet, Take 1 tablet (40 mg total) by mouth daily before breakfast., Disp: 30 tablet, Rfl: 11 .  Syringe, Disposable, 5 ML MISC, Inject 40 Units into the skin daily., Disp: 100 each, Rfl: 0  Allergies  Allergen Reactions  . Morphine Sulfate Itching    Review of Systems  Constitutional: Negative for fatigue.  Eyes: Negative for blurred vision.  Respiratory: Negative for shortness of breath.   Cardiovascular: Negative for chest pain and palpitations.  Neurological: Negative for headaches.  Endo/Heme/Allergies: Negative for polydipsia.    Objective  Filed Vitals:   11/04/15 1336  BP: 118/64  Pulse: 97  Temp: 98 F (36.7 C)  TempSrc: Oral  Resp: 16  Height: 4\' 10"  (1.473 m)  Weight: 190 lb 14.4 oz (86.592 kg)  SpO2: 97%    Physical Exam  Constitutional: She is oriented to person, place, and time and well-developed, well-nourished, and in no distress.  HENT:  Head: Normocephalic and atraumatic.  Cardiovascular: Normal rate and regular rhythm.   Pulmonary/Chest: Effort normal and breath sounds normal.  Abdominal: Soft. Bowel sounds are normal.  Neurological: She is alert and oriented to person, place, and time.  Nursing note and vitals reviewed.    Assessment & Plan  1. Essential hypertension Blood pressure is stable and controlled on present antihypertensive therapy.  2. Dyslipidemia associated with type 2  diabetes mellitus ([place]) Not a candidate for statin therapy because of chronic hepatitis C. Obtain FLP and consider starting on Vascepa - Lipid Profile  3. Elevated alkaline phosphatase level  - Comprehensive Metabolic Panel (CMET)  4. Controlled type 2 diabetes mellitus without complication, with long-term current use of insulin (H[facility]) A1c is 6.5%, considered stable. We will decrease Lantus from 40 units to 30 units over 1 month, add Jardiance 10 mg to her regimen. Advised to check blood glucose daily and titrate down to 30 units of Lantus by the end of this month. Review blood glucose logs in 3 months.  - empagliflozin (JARDIANCE) 10 MG TABS tablet; Take 10 mg by mouth daily.  Dispense: 30 tablet; Refill: 2 - POCT HgB A1C - POCT Glucose (CBG)   Jessicah Croll Asad A. [facility] 11/04/2015 2:09 PM

## 2015-11-13 ENCOUNTER — Other Ambulatory Visit: Payer: Self-pay | Admitting: Family Medicine

## 2016-01-01 ENCOUNTER — Other Ambulatory Visit: Payer: Self-pay | Admitting: Family Medicine

## 2016-01-02 ENCOUNTER — Telehealth: Payer: Self-pay | Admitting: Internal Medicine

## 2016-01-02 DIAGNOSIS — M5416 Radiculopathy, lumbar region: Secondary | ICD-10-CM

## 2016-01-02 DIAGNOSIS — F329 Major depressive disorder, single episode, unspecified: Secondary | ICD-10-CM

## 2016-01-02 DIAGNOSIS — F32A Depression, unspecified: Secondary | ICD-10-CM

## 2016-01-02 DIAGNOSIS — G8929 Other chronic pain: Secondary | ICD-10-CM

## 2016-01-02 MED ORDER — CYCLOBENZAPRINE HCL 10 MG PO TABS: 10.0000 mg | ORAL_TABLET | Freq: Every day | ORAL | Status: DC

## 2016-01-02 MED ORDER — FLUOXETINE HCL 40 MG PO CAPS: 40.0000 mg | ORAL_CAPSULE | Freq: Every day | ORAL | Status: DC

## 2016-01-02 NOTE — Telephone Encounter (Signed)
Patient not to her bottle of Prozac into the toilet. We will provide a 30 day supply until her appointment next month

## 2016-01-02 NOTE — Telephone Encounter (Signed)
Pt states she was denied her Prozac. According to her chart she should have enough until her appt in Aug. Once I informed the patient of this, she then stated that she knocked her bottle into the toilet so therefore she does not have any to take. Pt also wanted Dr Manuella Ghazi to know that she is having surgery 01/07/2016 to remove 3 of her teeth. Please advise about her medication.

## 2016-02-04 ENCOUNTER — Ambulatory Visit (INDEPENDENT_AMBULATORY_CARE_PROVIDER_SITE_OTHER): Payer: Commercial Managed Care - HMO | Admitting: Family Medicine

## 2016-02-04 ENCOUNTER — Encounter: Payer: Self-pay | Admitting: Family Medicine

## 2016-02-04 VITALS — BP 122/74 | HR 93 | Temp 98.9°F | Resp 18 | Ht <= 58 in | Wt 191.0 lb

## 2016-02-04 DIAGNOSIS — F32A Depression, unspecified: Secondary | ICD-10-CM

## 2016-02-04 DIAGNOSIS — E785 Hyperlipidemia, unspecified: Secondary | ICD-10-CM | POA: Diagnosis not present

## 2016-02-04 DIAGNOSIS — I10 Essential (primary) hypertension: Secondary | ICD-10-CM

## 2016-02-04 DIAGNOSIS — F329 Major depressive disorder, single episode, unspecified: Secondary | ICD-10-CM | POA: Diagnosis not present

## 2016-02-04 DIAGNOSIS — Z794 Long term (current) use of insulin: Secondary | ICD-10-CM

## 2016-02-04 DIAGNOSIS — J42 Unspecified chronic bronchitis: Secondary | ICD-10-CM

## 2016-02-04 DIAGNOSIS — E119 Type 2 diabetes mellitus without complications: Secondary | ICD-10-CM | POA: Diagnosis not present

## 2016-02-04 LAB — POCT GLYCOSYLATED HEMOGLOBIN (HGB A1C): Hemoglobin A1C: 6.4

## 2016-02-04 LAB — GLUCOSE, POCT (MANUAL RESULT ENTRY): POC Glucose: 140 mg/dl — AB (ref 70–99)

## 2016-02-04 MED ORDER — LOSARTAN POTASSIUM 100 MG PO TABS: 100.0000 mg | ORAL_TABLET | Freq: Every day | ORAL | 0 refills | Status: DC

## 2016-02-04 MED ORDER — EMPAGLIFLOZIN 10 MG PO TABS
10.0000 mg | ORAL_TABLET | Freq: Every day | ORAL | 0 refills | Status: DC
Start: 2016-02-04 — End: 2016-05-05

## 2016-02-04 MED ORDER — GLIPIZIDE 5 MG PO TABS: 5.0000 mg | ORAL_TABLET | Freq: Every day | ORAL | 0 refills | Status: DC

## 2016-02-04 MED ORDER — FLUOXETINE HCL 20 MG PO TABS: 20.0000 mg | ORAL_TABLET | Freq: Every day | ORAL | 0 refills | Status: DC

## 2016-02-04 MED ORDER — ALBUTEROL SULFATE HFA 108 (90 BASE) MCG/ACT IN AERS: 2.0000 | INHALATION_SPRAY | Freq: Four times a day (QID) | RESPIRATORY_TRACT | 2 refills | Status: DC | PRN

## 2016-02-04 NOTE — Progress Notes (Signed)
Name: Tara Cochran   MRN: [id number]    DOB: 1961/02/18   Date:02/04/2016       Progress Note  Subjective  Chief Complaint  Chief Complaint  Patient presents with  . Follow-up    3 mo  . Medication Refill    Diabetes  She presents for her follow-up diabetic visit. She has type 2 diabetes mellitus. Her disease course has been improving. Pertinent negatives for diabetes include no chest pain, no fatigue, no polydipsia and no polyuria. Current diabetic treatment includes oral agent (dual therapy). She is following a diabetic diet. An ACE inhibitor/angiotensin II receptor blocker is being taken.      Past Medical History:  Diagnosis Date  . Diabetes mellitus without complication ([place])   . Hypertension   . Polycythemia     Past Surgical History:  Procedure Laterality Date  . CESAREAN SECTION    . CHOLECYSTECTOMY    . COLONOSCOPY N/A 12/12/2014   Procedure: COLONOSCOPY;  Surgeon: Lucilla Lame, MD;  Location: [facility];  Service: Gastroenterology;  Laterality: N/A;  . ESOPHAGOGASTRODUODENOSCOPY N/A 12/12/2014   Procedure: ESOPHAGOGASTRODUODENOSCOPY (EGD);  Surgeon: Lucilla Lame, MD;  Location: [facility];  Service: Gastroenterology;  Laterality: N/A;  . POLYPECTOMY  12/12/2014   Procedure: POLYPECTOMY;  Surgeon: Lucilla Lame, MD;  Location: [facility];  Service: Gastroenterology;;    Family History  Problem Relation Age of Onset  . Stroke Mother   . Hypertension Mother   . Hypertension Father   . Stroke Father   . Heart disease Sister     Social History   Social History  . Marital status: Divorced    Spouse name: N/A  . Number of children: N/A  . Years of education: N/A   Occupational History  . Not on file.   Social History Main Topics  . Smoking status: Current Every Day Smoker    Packs/day: 0.50    Types: Cigarettes  . Smokeless tobacco: Never Used  . Alcohol use 0.6 oz/week    1 Glasses of wine per week  . Drug use:    Frequency: 2.0 times per week    Types: Marijuana  . Sexual activity: Not on file   Other Topics Concern  . Not on file   Social History Narrative  . No narrative on file     Current Outpatient Prescriptions:  .  ACCU-CHEK SMARTVIEW test strip, as directed., Disp: , Rfl:  .  albuterol (VENTOLIN HFA) 108 (90 Base) MCG/ACT inhaler, Inhale 2 puffs into the lungs every 6 (six) hours as needed for wheezing or shortness of breath., Disp: 18 g, Rfl: 1 .  cyclobenzaprine (FLEXERIL) 10 MG tablet, Take 1 tablet (10 mg total) by mouth at bedtime., Disp: 30 tablet, Rfl: 2 .  Eluxadoline (VIBERZI) 75 MG TABS, Take 75 mg by mouth 2 (two) times daily., Disp: 60 tablet, Rfl: 5 .  empagliflozin (JARDIANCE) 10 MG TABS tablet, Take 10 mg by mouth daily., Disp: 30 tablet, Rfl: 2 .  FLUoxetine (PROZAC) 40 MG capsule, Take 1 capsule (40 mg total) by mouth daily., Disp: 30 capsule, Rfl: 0 .  glipiZIDE (GLUCOTROL) 5 MG tablet, TAKE 1 TABLET BY MOUTH ONCE A DAY BEFOREBREAKFAST, Disp: 90 tablet, Rfl: 0 .  Incontinence Supply Disposable (BLADDER CONTROL PADS EX ABSORB) MISC, daily., Disp: , Rfl:  .  Insulin Pen Needle (BD PEN NEEDLE NANO U/F) 32G X 4 MM MISC, 1 Units by Does not apply route once. One needle per injection, Disp:  100 each, Rfl: 3 .  losartan (COZAAR) 100 MG tablet, Take 1 tablet (100 mg total) by mouth daily., Disp: 90 tablet, Rfl: 1 .  Multiple Vitamins-Minerals (WOMENS MULTIVITAMIN PLUS PO), daily., Disp: , Rfl:  .  pantoprazole (PROTONIX) 40 MG tablet, Take 1 tablet (40 mg total) by mouth daily before breakfast., Disp: 30 tablet, Rfl: 11 .  Syringe, Disposable, 5 ML MISC, Inject 40 Units into the skin daily., Disp: 100 each, Rfl: 0 .  insulin glargine (LANTUS) 100 UNIT/ML injection, Inject 0.4 mLs (40 Units total) into the skin at bedtime. (Patient not taking: Reported on 02/04/2016), Disp: 10 mL, Rfl: 11 .  metaxalone (SKELAXIN) 800 MG tablet, Take 1 tablet (800 mg total) by mouth 3 (three) times  daily as needed for muscle spasms. (Patient not taking: Reported on 02/04/2016), Disp: 90 tablet, Rfl: 0  Allergies  Allergen Reactions  . Morphine Sulfate Itching     Review of Systems  Constitutional: Negative for fatigue.  Cardiovascular: Negative for chest pain.  Endo/Heme/Allergies: Negative for polydipsia.    Objective  Vitals:   02/04/16 1542  BP: 122/74  Pulse: 93  Resp: 18  Temp: 98.9 F (37.2 C)  TempSrc: Oral  SpO2: 93%  Weight: 191 lb (86.6 kg)  Height: 4\' 10"  (1.473 m)    Physical Exam  Constitutional: She is oriented to person, place, and time and well-developed, well-nourished, and in no distress.  HENT:  Head: Normocephalic and atraumatic.  Cardiovascular: Normal rate, regular rhythm and normal heart sounds.   No murmur heard. Pulmonary/Chest: Effort normal and breath sounds normal. She has no wheezes.  Abdominal: Soft. Bowel sounds are normal. There is no tenderness.  Neurological: She is alert and oriented to person, place, and time.  Psychiatric: Mood, memory, affect and judgment normal.  Nursing note and vitals reviewed.       Assessment & Plan  1. Essential hypertension BP stable on present antihypertensive therapy - losartan (COZAAR) 100 MG tablet; Take 1 tablet (100 mg total) by mouth daily.  Dispense: 90 tablet; Refill: 0  2. Controlled type 2 diabetes mellitus without complication, without long-term current use of insulin (HCC) A1c at goal at 6.4%, continue on Glipizide and Jardiance - glipiZIDE (GLUCOTROL) 5 MG tablet; Take 1 tablet (5 mg total) by mouth daily before breakfast.  Dispense: 90 tablet; Refill: 0 - empagliflozin (JARDIANCE) 10 MG TABS tablet; Take 10 mg by mouth daily.  Dispense: 90 tablet; Refill: 0 - Urine Microalbumin w/creat. ratio - POCT HgB A1C - POCT Glucose (CBG)  3. Depression Requesting decrease in dose from 40 mg to 20 mg as the higher dose makes her feel "apathetic" - FLUoxetine (PROZAC) 20 MG tablet; Take  1 tablet (20 mg total) by mouth daily.  Dispense: 90 tablet; Refill: 0  4. Hyperlipidemia Advised to consult gastroenterology regarding appropriateness of non-statin therapy. Statin therapy is contraindicated as per GI due to history of hepatitis C - Lipid Profile - COMPLETE METABOLIC PANEL WITH GFR  5. Chronic bronchitis, unspecified chronic bronchitis type (HCC)  - albuterol (VENTOLIN HFA) 108 (90 Base) MCG/ACT inhaler; Inhale 2 puffs into the lungs every 6 (six) hours as needed for wheezing or shortness of breath.  Dispense: 18 g; Refill: 2   Lorrin Bodner [facility] 02/04/2016 4:13 PM

## 2016-03-02 ENCOUNTER — Other Ambulatory Visit: Payer: Self-pay | Admitting: Family Medicine

## 2016-03-02 DIAGNOSIS — E119 Type 2 diabetes mellitus without complications: Secondary | ICD-10-CM | POA: Diagnosis not present

## 2016-03-02 DIAGNOSIS — E785 Hyperlipidemia, unspecified: Secondary | ICD-10-CM | POA: Diagnosis not present

## 2016-03-02 DIAGNOSIS — Z794 Long term (current) use of insulin: Secondary | ICD-10-CM | POA: Diagnosis not present

## 2016-03-03 LAB — COMPLETE METABOLIC PANEL WITH GFR
ALBUMIN: 4.2 g/dL (ref 3.6–5.1)
ALT: 23 U/L (ref 6–29)
AST: 17 U/L (ref 10–35)
Alkaline Phosphatase: 155 U/L — ABNORMAL HIGH (ref 33–130)
BUN: 10 mg/dL (ref 7–25)
CHLORIDE: 102 mmol/L (ref 98–110)
CO2: 23 mmol/L (ref 20–31)
Calcium: 10.4 mg/dL (ref 8.6–10.4)
Creat: 0.66 mg/dL (ref 0.50–1.05)
GFR, Est African American: >89 mL/min (ref >=60)
GFR, Est Non African American: >89 mL/min (ref >=60)
GLUCOSE: 177 mg/dL — AB (ref 65–99)
POTASSIUM: 4.6 mmol/L (ref 3.5–5.3)
SODIUM: 140 mmol/L (ref 135–146)
Total Bilirubin: 0.7 mg/dL (ref 0.2–1.2)
Total Protein: 7.9 g/dL (ref 6.1–8.1)

## 2016-03-03 LAB — LIPID PANEL
CHOL/HDL RATIO: 3.8 ratio (ref <=5.0)
CHOLESTEROL: 241 mg/dL — AB (ref 125–200)
HDL: 64 mg/dL (ref >=46)
LDL Cholesterol: 131 mg/dL — ABNORMAL HIGH (ref <130)
TRIGLYCERIDES: 232 mg/dL — AB (ref <150)
VLDL: 46 mg/dL — AB (ref <30)

## 2016-03-03 LAB — MICROALBUMIN / CREATININE URINE RATIO
Creatinine, Urine: 342 mg/dL — ABNORMAL HIGH (ref 20–320)
Microalb Creat Ratio: 76 mcg/mg creat — ABNORMAL HIGH (ref <30)
Microalb, Ur: 25.9 mg/dL

## 2016-03-04 ENCOUNTER — Telehealth: Payer: Self-pay | Admitting: Gastroenterology

## 2016-03-04 NOTE — Telephone Encounter (Signed)
Patient would like to talk to you regarding statins

## 2016-03-04 NOTE — Telephone Encounter (Signed)
Pt was previously treated for Hep C. She currently has high cholesterol and her PCP wants to consider starting her on a statin drug to reduce this. Please advise if you recommend or not to start a statin drug as this can elevate her liver enzymes. Enzymes are currently normal except for alkaline phos.

## 2016-03-08 NOTE — Telephone Encounter (Signed)
Let the patient know that she is at no increased risk of this medication and she should be monitored for increased LFT's like everyone else.

## 2016-03-08 NOTE — Telephone Encounter (Signed)
Left vm letting pt know Dr. Dorothey Baseman recommendation regarding statin drugs. Advised her to contact me with any questions.

## 2016-03-09 ENCOUNTER — Telehealth: Payer: Self-pay | Admitting: Internal Medicine

## 2016-03-09 DIAGNOSIS — E785 Hyperlipidemia, unspecified: Principal | ICD-10-CM

## 2016-03-09 DIAGNOSIS — E1169 Type 2 diabetes mellitus with other specified complication: Secondary | ICD-10-CM

## 2016-03-09 MED ORDER — ROSUVASTATIN CALCIUM 10 MG PO TABS: 10.0000 mg | ORAL_TABLET | Freq: Every day | ORAL | 0 refills | Status: DC

## 2016-03-10 NOTE — Telephone Encounter (Signed)
Patient verbally informed and thanks you. Please close chart.

## 2016-04-12 ENCOUNTER — Telehealth: Payer: Self-pay | Admitting: Internal Medicine

## 2016-04-12 NOTE — Telephone Encounter (Signed)
Pt states she would like a call back. She thinks she may be having side effects to the [place]. Please call her.

## 2016-04-13 NOTE — Telephone Encounter (Signed)
Patient thinks she may be experiencing a reaction to  thinks she may be having side effects to the [place]. Please call her. Pain in upper abdomen. Patient has stopped taking medication and has scheduled an appointment for 04/15/2016 @ 11:00 am.

## 2016-04-15 ENCOUNTER — Ambulatory Visit (INDEPENDENT_AMBULATORY_CARE_PROVIDER_SITE_OTHER): Payer: Commercial Managed Care - HMO | Admitting: Family Medicine

## 2016-04-15 ENCOUNTER — Encounter: Payer: Self-pay | Admitting: Family Medicine

## 2016-04-15 VITALS — BP 120/70 | HR 99 | Temp 98.0°F | Resp 16 | Ht <= 58 in | Wt 189.9 lb

## 2016-04-15 DIAGNOSIS — R1013 Epigastric pain: Secondary | ICD-10-CM | POA: Diagnosis not present

## 2016-04-15 LAB — COMPLETE METABOLIC PANEL WITH GFR
ALBUMIN: 3.8 g/dL (ref 3.6–5.1)
ALK PHOS: 124 U/L (ref 33–130)
ALT: 22 U/L (ref 6–29)
AST: 17 U/L (ref 10–35)
BUN: 14 mg/dL (ref 7–25)
CALCIUM: 9.6 mg/dL (ref 8.6–10.4)
CO2: 22 mmol/L (ref 20–31)
Chloride: 109 mmol/L (ref 98–110)
Creat: 0.61 mg/dL (ref 0.50–1.05)
GFR, Est Non African American: >89 mL/min (ref >=60)
Glucose, Bld: 182 mg/dL — ABNORMAL HIGH (ref 65–99)
POTASSIUM: 4.8 mmol/L (ref 3.5–5.3)
Sodium: 142 mmol/L (ref 135–146)
Total Bilirubin: 0.6 mg/dL (ref 0.2–1.2)
Total Protein: 7 g/dL (ref 6.1–8.1)

## 2016-04-15 LAB — CBC WITH DIFFERENTIAL/PLATELET
BASOS ABS: 0 {cells}/uL (ref 0–200)
Basophils Relative: 0 %
EOS ABS: 176 {cells}/uL (ref 15–500)
EOS PCT: 2 %
HCT: 44.8 % (ref 35.0–45.0)
Hemoglobin: 15.3 g/dL (ref 11.7–15.5)
LYMPHS PCT: 31 %
Lymphs Abs: 2728 cells/uL (ref 850–3900)
MCH: 30.6 pg (ref 27.0–33.0)
MCHC: 34.2 g/dL (ref 32.0–36.0)
MCV: 89.6 fL (ref 80.0–100.0)
MONOS PCT: 7 %
MPV: 10.4 fL (ref 7.5–12.5)
Monocytes Absolute: 616 cells/uL (ref 200–950)
NEUTROS PCT: 60 %
Neutro Abs: 5280 cells/uL (ref 1500–7800)
PLATELETS: 353 10*3/uL (ref 140–400)
RBC: 5 MIL/uL (ref 3.80–5.10)
RDW: 13.6 % (ref 11.0–15.0)
WBC: 8.8 10*3/uL (ref 3.8–10.8)

## 2016-04-15 LAB — LIPASE: Lipase: 91 U/L — ABNORMAL HIGH (ref 7–60)

## 2016-04-15 LAB — AMYLASE: AMYLASE: 66 U/L (ref 0–105)

## 2016-04-15 NOTE — Progress Notes (Signed)
Name: Tara Cochran   MRN: [id number]    DOB: 04-14-1961   Date:04/15/2016       Progress Note  Subjective  Chief Complaint  Chief Complaint  Patient presents with  . Medication Problem    reaction to Jardiance    Abdominal Pain  This is a new problem. The current episode started in the past 7 days. The onset quality is sudden. The pain is located in the epigastric region (Started 5 days ago, abrupt onset.). The pain is at a severity of 0/10. The patient is experiencing no pain. Quality: Resolved. Pertinent negatives include no constipation, diarrhea, dysuria, fever, flatus, myalgias, nausea or vomiting. She has tried nothing for the symptoms.     Past Medical History:  Diagnosis Date  . Diabetes mellitus without complication ([place])   . Hypertension   . Polycythemia     Past Surgical History:  Procedure Laterality Date  . CESAREAN SECTION    . CHOLECYSTECTOMY    . COLONOSCOPY N/A 12/12/2014   Procedure: COLONOSCOPY;  Surgeon: Lucilla Lame, MD;  Location: [facility];  Service: Gastroenterology;  Laterality: N/A;  . ESOPHAGOGASTRODUODENOSCOPY N/A 12/12/2014   Procedure: ESOPHAGOGASTRODUODENOSCOPY (EGD);  Surgeon: Lucilla Lame, MD;  Location: [facility];  Service: Gastroenterology;  Laterality: N/A;  . GALLBLADDER SURGERY    . POLYPECTOMY  12/12/2014   Procedure: POLYPECTOMY;  Surgeon: Lucilla Lame, MD;  Location: [facility];  Service: Gastroenterology;;    Family History  Problem Relation Age of Onset  . Stroke Mother   . Hypertension Mother   . Hypertension Father   . Stroke Father   . Heart disease Sister     Social History   Social History  . Marital status: Divorced    Spouse name: N/A  . Number of children: N/A  . Years of education: N/A   Occupational History  . Not on file.   Social History Main Topics  . Smoking status: Current Every Day Smoker    Packs/day: 0.50    Types: Cigarettes  . Smokeless tobacco: Never Used  .  Alcohol use 0.6 oz/week    1 Glasses of wine per week  . Drug use:     Frequency: 2.0 times per week    Types: Marijuana  . Sexual activity: Not on file   Other Topics Concern  . Not on file   Social History Narrative  . No narrative on file     Current Outpatient Prescriptions:  .  ACCU-CHEK SMARTVIEW test strip, as directed., Disp: , Rfl:  .  albuterol (VENTOLIN HFA) 108 (90 Base) MCG/ACT inhaler, Inhale 2 puffs into the lungs every 6 (six) hours as needed for wheezing or shortness of breath., Disp: 18 g, Rfl: 2 .  cyclobenzaprine (FLEXERIL) 10 MG tablet, Take 1 tablet (10 mg total) by mouth at bedtime., Disp: 30 tablet, Rfl: 2 .  Eluxadoline (VIBERZI) 75 MG TABS, Take 75 mg by mouth 2 (two) times daily., Disp: 60 tablet, Rfl: 5 .  FLUoxetine (PROZAC) 20 MG tablet, Take 1 tablet (20 mg total) by mouth daily., Disp: 90 tablet, Rfl: 0 .  glipiZIDE (GLUCOTROL) 5 MG tablet, Take 1 tablet (5 mg total) by mouth daily before breakfast., Disp: 90 tablet, Rfl: 0 .  Incontinence Supply Disposable (BLADDER CONTROL PADS EX ABSORB) MISC, daily., Disp: , Rfl:  .  losartan (COZAAR) 100 MG tablet, Take 1 tablet (100 mg total) by mouth daily., Disp: 90 tablet, Rfl: 0 .  Multiple Vitamins-Minerals (WOMENS MULTIVITAMIN  PLUS PO), daily., Disp: , Rfl:  .  pantoprazole (PROTONIX) 40 MG tablet, Take 1 tablet (40 mg total) by mouth daily before breakfast., Disp: 30 tablet, Rfl: 11 .  rosuvastatin (CRESTOR) 10 MG tablet, Take 1 tablet (10 mg total) by mouth daily., Disp: 90 tablet, Rfl: 0 .  empagliflozin (JARDIANCE) 10 MG TABS tablet, Take 10 mg by mouth daily. (Patient not taking: Reported on 04/15/2016), Disp: 90 tablet, Rfl: 0  Allergies  Allergen Reactions  . Morphine Sulfate Itching     Review of Systems  Constitutional: Positive for malaise/fatigue. Negative for chills and fever.  Gastrointestinal: Positive for abdominal pain. Negative for constipation, diarrhea, flatus, nausea and vomiting.   Genitourinary: Negative for dysuria and urgency.  Musculoskeletal: Negative for myalgias.    Objective  Vitals:   04/15/16 1110  BP: 120/70  Pulse: 99  Resp: 16  Temp: 98 F (36.7 C)  TempSrc: Oral  SpO2: 95%  Weight: 189 lb 14.4 oz (86.1 kg)  Height: 4\' 10"  (1.473 m)    Physical Exam  Constitutional: She is well-developed, well-nourished, and in no distress.  Cardiovascular: Normal rate, regular rhythm, S1 normal, S2 normal and normal heart sounds.   No murmur heard. Pulmonary/Chest: Effort normal and breath sounds normal. She has no wheezes.  Abdominal: Soft. Bowel sounds are normal. There is no tenderness.  Nursing note and vitals reviewed.     Assessment & Plan  1. Epigastric pain Now resolved, patient worried about possibility of pancreatitis and thinks that Jardiance type 2 diabetes can cause pancreatitis. Reassured that Vania Rea has no side effect of pancreatitis, we'll obtain laboratory workup. - CBC with Differential - Amylase - Lipase - COMPLETE METABOLIC PANEL WITH GFR   Elfrieda Espino [facility] 04/15/2016 11:33 AM

## 2016-04-28 ENCOUNTER — Other Ambulatory Visit: Payer: Self-pay

## 2016-05-04 ENCOUNTER — Other Ambulatory Visit: Payer: Self-pay

## 2016-05-04 DIAGNOSIS — K580 Irritable bowel syndrome with diarrhea: Secondary | ICD-10-CM

## 2016-05-04 MED ORDER — ELUXADOLINE 75 MG PO TABS: 75.0000 mg | ORAL_TABLET | Freq: Two times a day (BID) | ORAL | 5 refills | Status: DC

## 2016-05-05 ENCOUNTER — Ambulatory Visit (INDEPENDENT_AMBULATORY_CARE_PROVIDER_SITE_OTHER): Payer: Commercial Managed Care - HMO | Admitting: Family Medicine

## 2016-05-05 ENCOUNTER — Encounter: Payer: Self-pay | Admitting: Family Medicine

## 2016-05-05 VITALS — BP 134/76 | HR 84 | Temp 98.5°F | Resp 16 | Ht <= 58 in | Wt 186.2 lb

## 2016-05-05 DIAGNOSIS — E785 Hyperlipidemia, unspecified: Secondary | ICD-10-CM | POA: Diagnosis not present

## 2016-05-05 DIAGNOSIS — M5416 Radiculopathy, lumbar region: Secondary | ICD-10-CM

## 2016-05-05 DIAGNOSIS — I10 Essential (primary) hypertension: Secondary | ICD-10-CM | POA: Diagnosis not present

## 2016-05-05 DIAGNOSIS — G8929 Other chronic pain: Secondary | ICD-10-CM

## 2016-05-05 DIAGNOSIS — Z794 Long term (current) use of insulin: Secondary | ICD-10-CM | POA: Diagnosis not present

## 2016-05-05 DIAGNOSIS — E119 Type 2 diabetes mellitus without complications: Secondary | ICD-10-CM | POA: Diagnosis not present

## 2016-05-05 DIAGNOSIS — E1169 Type 2 diabetes mellitus with other specified complication: Secondary | ICD-10-CM | POA: Diagnosis not present

## 2016-05-05 DIAGNOSIS — Z23 Encounter for immunization: Secondary | ICD-10-CM

## 2016-05-05 LAB — COMPLETE METABOLIC PANEL WITH GFR
ALT: 23 U/L (ref 6–29)
AST: 17 U/L (ref 10–35)
Albumin: 4.2 g/dL (ref 3.6–5.1)
Alkaline Phosphatase: 131 U/L — ABNORMAL HIGH (ref 33–130)
BILIRUBIN TOTAL: 0.5 mg/dL (ref 0.2–1.2)
BUN: 12 mg/dL (ref 7–25)
CALCIUM: 10 mg/dL (ref 8.6–10.4)
CHLORIDE: 108 mmol/L (ref 98–110)
CO2: 18 mmol/L — ABNORMAL LOW (ref 20–31)
CREATININE: 0.65 mg/dL (ref 0.50–1.05)
Glucose, Bld: 185 mg/dL — ABNORMAL HIGH (ref 65–99)
Potassium: 4.2 mmol/L (ref 3.5–5.3)
Sodium: 144 mmol/L (ref 135–146)
TOTAL PROTEIN: 7.4 g/dL (ref 6.1–8.1)

## 2016-05-05 LAB — LIPID PANEL
CHOLESTEROL: 155 mg/dL (ref <200)
HDL: 61 mg/dL (ref >50)
LDL Cholesterol: 63 mg/dL
TRIGLYCERIDES: 154 mg/dL — AB (ref <150)
Total CHOL/HDL Ratio: 2.5 Ratio (ref <5.0)
VLDL: 31 mg/dL — AB (ref <30)

## 2016-05-05 LAB — POCT GLYCOSYLATED HEMOGLOBIN (HGB A1C): Hemoglobin A1C: 6.5

## 2016-05-05 LAB — GLUCOSE, POCT (MANUAL RESULT ENTRY): POC Glucose: 186 mg/dl — AB (ref 70–99)

## 2016-05-05 MED ORDER — GLIPIZIDE 5 MG PO TABS: 5.0000 mg | ORAL_TABLET | Freq: Every day | ORAL | 0 refills | Status: DC

## 2016-05-05 MED ORDER — ROSUVASTATIN CALCIUM 10 MG PO TABS: 10.0000 mg | ORAL_TABLET | Freq: Every day | ORAL | 0 refills | Status: DC

## 2016-05-05 MED ORDER — METHOCARBAMOL 500 MG PO TABS: 500.0000 mg | ORAL_TABLET | Freq: Three times a day (TID) | ORAL | 0 refills | Status: DC | PRN

## 2016-05-05 MED ORDER — LOSARTAN POTASSIUM 100 MG PO TABS: 100.0000 mg | ORAL_TABLET | Freq: Every day | ORAL | 0 refills | Status: DC

## 2016-05-05 MED ORDER — EMPAGLIFLOZIN 10 MG PO TABS: 10.0000 mg | ORAL_TABLET | Freq: Every day | ORAL | 0 refills | Status: DC

## 2016-05-05 NOTE — Progress Notes (Signed)
Name: Tara Cochran   MRN: [id number]    DOB: [birth year]   Date:05/05/2016       Progress Note  Subjective  Chief Complaint  Chief Complaint  Patient presents with  . Follow-up    Diabetes  She presents for her follow-up diabetic visit. She has type 2 diabetes mellitus. Her disease course has been stable. There are no hypoglycemic associated symptoms. Pertinent negatives for hypoglycemia include no headaches or sweats. Pertinent negatives for diabetes include no blurred vision, no chest pain, no fatigue, no polydipsia and no polyuria. Symptoms are stable. Pertinent negatives for diabetic complications include no CVA, heart disease or peripheral neuropathy. Current diabetic treatment includes oral agent (dual therapy). Her breakfast blood glucose range is generally 180-200 mg/dl. An ACE inhibitor/angiotensin II receptor blocker is being taken.  Hypertension  This is a chronic problem. The problem is unchanged. The problem is controlled. Pertinent negatives include no blurred vision, chest pain, headaches, palpitations, shortness of breath or sweats. Past treatments include angiotensin blockers. There is no history of CVA.  Hyperlipidemia  This is a chronic problem. The problem is uncontrolled (elevated total and LDL cholesterol.). Pertinent negatives include no chest pain or shortness of breath. Current antihyperlipidemic treatment includes statins.     Past Medical History:  Diagnosis Date  . Diabetes mellitus without complication ([place])   . Hypertension   . Polycythemia     Past Surgical History:  Procedure Laterality Date  . CESAREAN SECTION    . CHOLECYSTECTOMY    . COLONOSCOPY N/A 12/12/2014   Procedure: COLONOSCOPY;  Surgeon: Lucilla Lame, MD;  Location: [facility];  Service: Gastroenterology;  Laterality: N/A;  . ESOPHAGOGASTRODUODENOSCOPY N/A 12/12/2014   Procedure: ESOPHAGOGASTRODUODENOSCOPY (EGD);  Surgeon: Lucilla Lame, MD;  Location: [facility];   Service: Gastroenterology;  Laterality: N/A;  . GALLBLADDER SURGERY    . POLYPECTOMY  12/12/2014   Procedure: POLYPECTOMY;  Surgeon: Lucilla Lame, MD;  Location: [facility];  Service: Gastroenterology;;    Family History  Problem Relation Age of Onset  . Stroke Mother   . Hypertension Mother   . Hypertension Father   . Stroke Father   . Heart disease Sister     Social History   Social History  . Marital status: Divorced    Spouse name: N/A  . Number of children: N/A  . Years of education: N/A   Occupational History  . Not on file.   Social History Main Topics  . Smoking status: Current Every Day Smoker    Packs/day: 0.50    Types: Cigarettes  . Smokeless tobacco: Never Used  . Alcohol use 0.6 oz/week    1 Glasses of wine per week  . Drug use:     Frequency: 2.0 times per week    Types: Marijuana  . Sexual activity: Not on file   Other Topics Concern  . Not on file   Social History Narrative  . No narrative on file     Current Outpatient Prescriptions:  .  ACCU-CHEK SMARTVIEW test strip, as directed., Disp: , Rfl:  .  albuterol (VENTOLIN HFA) 108 (90 Base) MCG/ACT inhaler, Inhale 2 puffs into the lungs every 6 (six) hours as needed for wheezing or shortness of breath., Disp: 18 g, Rfl: 2 .  cyclobenzaprine (FLEXERIL) 10 MG tablet, Take 1 tablet (10 mg total) by mouth at bedtime., Disp: 30 tablet, Rfl: 2 .  Eluxadoline (VIBERZI) 75 MG TABS, Take 75 mg by mouth 2 (two) times daily., Disp:  60 tablet, Rfl: 5 .  empagliflozin (JARDIANCE) 10 MG TABS tablet, Take 10 mg by mouth daily., Disp: 90 tablet, Rfl: 0 .  FLUoxetine (PROZAC) 20 MG tablet, Take 1 tablet (20 mg total) by mouth daily., Disp: 90 tablet, Rfl: 0 .  glipiZIDE (GLUCOTROL) 5 MG tablet, Take 1 tablet (5 mg total) by mouth daily before breakfast., Disp: 90 tablet, Rfl: 0 .  Incontinence Supply Disposable (BLADDER CONTROL PADS EX ABSORB) MISC, daily., Disp: , Rfl:  .  losartan (COZAAR) 100 MG tablet,  Take 1 tablet (100 mg total) by mouth daily., Disp: 90 tablet, Rfl: 0 .  Multiple Vitamins-Minerals (WOMENS MULTIVITAMIN PLUS PO), daily., Disp: , Rfl:  .  pantoprazole (PROTONIX) 40 MG tablet, Take 1 tablet (40 mg total) by mouth daily before breakfast., Disp: 30 tablet, Rfl: 11 .  rosuvastatin (CRESTOR) 10 MG tablet, Take 1 tablet (10 mg total) by mouth daily., Disp: 90 tablet, Rfl: 0  Allergies  Allergen Reactions  . Morphine Sulfate Itching     Review of Systems  Constitutional: Negative for fatigue.  Eyes: Negative for blurred vision.  Respiratory: Negative for shortness of breath.   Cardiovascular: Negative for chest pain and palpitations.  Neurological: Negative for headaches.  Endo/Heme/Allergies: Negative for polydipsia.    Objective  Vitals:   05/05/16 1057  BP: 134/76  Pulse: 84  Resp: 16  Temp: 98.5 F (36.9 C)  TempSrc: Oral  SpO2: 96%  Weight: 186 lb 3.2 oz (84.5 kg)  Height: 4\' 10"  (1.473 m)    Physical Exam  Constitutional: She is oriented to person, place, and time and well-developed, well-nourished, and in no distress.  HENT:  Head: Normocephalic and atraumatic.  Cardiovascular: Regular rhythm, S1 normal, S2 normal and normal heart sounds.  Tachycardia present.   No murmur heard. Pulmonary/Chest: Effort normal and breath sounds normal. She has no wheezes.  Abdominal: Soft. Bowel sounds are normal. There is tenderness in the epigastric area.  Neurological: She is alert and oriented to person, place, and time.  Psychiatric: Mood, memory, affect and judgment normal.  Nursing note and vitals reviewed.   Assessment & Plan  1. Need for influenza vaccination  - Flu Vaccine QUAD 36+ mos PF IM (Fluarix & Fluzone Quad PF)  2. Essential hypertension BP stable on present therapy - losartan (COZAAR) 100 MG tablet; Take 1 tablet (100 mg total) by mouth daily.  Dispense: 90 tablet; Refill: 0  3. Chronic radicular low back pain Patient will like to change  muscle relaxant from cyclobenzaprine to methocarbamol as she believes cyclobenzaprine makes her very sleepy. Prescription for methocarbamol provided - methocarbamol (ROBAXIN) 500 MG tablet; Take 1 tablet (500 mg total) by mouth every 8 (eight) hours as needed for muscle spasms.  Dispense: 90 tablet; Refill: 0  4. Controlled type 2 diabetes mellitus without complication, without long-term current use of insulin ([place]) Point of care A1c 6.5%, glucose elevated at 186 mg/dL. Continue on present therapy - POCT HgB A1C - POCT Glucose (CBG) - glipiZIDE (GLUCOTROL) 5 MG tablet; Take 1 tablet (5 mg total) by mouth daily before breakfast.  Dispense: 90 tablet; Refill: 0 - empagliflozin (JARDIANCE) 10 MG TABS tablet; Take 10 mg by mouth daily.  Dispense: 90 tablet; Refill: 0  5. Dyslipidemia associated with type 2 diabetes mellitus ([place]) Obtain FLP, continue on statin - Lipid Profile - COMPLETE METABOLIC PANEL WITH GFR - rosuvastatin (CRESTOR) 10 MG tablet; Take 1 tablet (10 mg total) by mouth daily.  Dispense: 90 tablet; Refill:  0  6. Need for pneumococcal vaccination  - Pneumococcal conjugate vaccine 13-valent  Shinita Mac [facility] 05/05/2016 11:15 AM

## 2016-05-06 ENCOUNTER — Ambulatory Visit: Payer: Commercial Managed Care - HMO | Admitting: Family Medicine

## 2016-07-01 ENCOUNTER — Other Ambulatory Visit: Payer: Self-pay | Admitting: Family Medicine

## 2016-07-01 DIAGNOSIS — J42 Unspecified chronic bronchitis: Secondary | ICD-10-CM

## 2016-08-06 ENCOUNTER — Encounter: Payer: Self-pay | Admitting: Family Medicine

## 2016-08-06 ENCOUNTER — Ambulatory Visit (INDEPENDENT_AMBULATORY_CARE_PROVIDER_SITE_OTHER): Payer: Commercial Managed Care - HMO | Admitting: Family Medicine

## 2016-08-06 ENCOUNTER — Telehealth: Payer: Self-pay | Admitting: Internal Medicine

## 2016-08-06 VITALS — BP 132/84 | HR 103 | Temp 98.8°F | Resp 16 | Ht <= 58 in | Wt 184.4 lb

## 2016-08-06 DIAGNOSIS — R350 Frequency of micturition: Secondary | ICD-10-CM | POA: Diagnosis not present

## 2016-08-06 DIAGNOSIS — E785 Hyperlipidemia, unspecified: Secondary | ICD-10-CM

## 2016-08-06 DIAGNOSIS — Z794 Long term (current) use of insulin: Secondary | ICD-10-CM | POA: Diagnosis not present

## 2016-08-06 DIAGNOSIS — E119 Type 2 diabetes mellitus without complications: Secondary | ICD-10-CM

## 2016-08-06 DIAGNOSIS — F3341 Major depressive disorder, recurrent, in partial remission: Secondary | ICD-10-CM

## 2016-08-06 DIAGNOSIS — E1169 Type 2 diabetes mellitus with other specified complication: Secondary | ICD-10-CM

## 2016-08-06 DIAGNOSIS — J42 Unspecified chronic bronchitis: Secondary | ICD-10-CM

## 2016-08-06 LAB — POCT URINALYSIS DIPSTICK
BILIRUBIN UA: NEGATIVE
GLUCOSE UA: NEGATIVE
KETONES UA: NEGATIVE
NITRITE UA: POSITIVE
Spec Grav, UA: 1.01
Urobilinogen, UA: 0.2
pH, UA: 5

## 2016-08-06 LAB — GLUCOSE, POCT (MANUAL RESULT ENTRY): POC Glucose: 168 mg/dl — AB (ref 70–99)

## 2016-08-06 LAB — POCT GLYCOSYLATED HEMOGLOBIN (HGB A1C): HEMOGLOBIN A1C: 7.2

## 2016-08-06 MED ORDER — ROSUVASTATIN CALCIUM 10 MG PO TABS: 10.0000 mg | ORAL_TABLET | Freq: Every day | ORAL | 0 refills | Status: DC

## 2016-08-06 MED ORDER — GLIPIZIDE 5 MG PO TABS: 5.0000 mg | ORAL_TABLET | Freq: Every day | ORAL | 0 refills | Status: DC

## 2016-08-06 MED ORDER — BUDESONIDE-FORMOTEROL FUMARATE 160-4.5 MCG/ACT IN AERO: 2.0000 | INHALATION_SPRAY | Freq: Two times a day (BID) | RESPIRATORY_TRACT | 0 refills | Status: DC

## 2016-08-06 MED ORDER — NITROFURANTOIN MONOHYD MACRO 100 MG PO CAPS: 100.0000 mg | ORAL_CAPSULE | Freq: Two times a day (BID) | ORAL | 0 refills | Status: DC

## 2016-08-06 MED ORDER — EMPAGLIFLOZIN 10 MG PO TABS: 10.0000 mg | ORAL_TABLET | Freq: Every day | ORAL | 0 refills | Status: DC

## 2016-08-06 MED ORDER — BUPROPION HCL ER (XL) 150 MG PO TB24: 150.0000 mg | ORAL_TABLET | Freq: Every day | ORAL | 0 refills | Status: DC

## 2016-08-06 NOTE — Progress Notes (Signed)
Name: Tara Cochran   MRN: [id number]    DOB: 10-Apr-1961   Date:08/06/2016       Progress Note  Subjective  Chief Complaint  Chief Complaint  Patient presents with  . Diabetes    3 month follow up  . Hypertension  . Hyperlipidemia    Diabetes  She presents for her follow-up diabetic visit. She has type 2 diabetes mellitus. Her disease course has been worsening. There are no hypoglycemic associated symptoms. Pertinent negatives for hypoglycemia include no headaches. Associated symptoms include polyuria (c/o itching with urination). Pertinent negatives for diabetes include no blurred vision, no chest pain, no fatigue and no polydipsia. Pertinent negatives for diabetic complications include no CVA. Current diabetic treatment includes oral agent (dual therapy). She is following a diabetic diet. She monitors blood glucose at home 1-2 x per day. Her breakfast blood glucose range is generally 180-200 mg/dl. An ACE inhibitor/angiotensin II receptor blocker is being taken.  Hypertension  This is a chronic problem. The problem is unchanged. The problem is controlled. Pertinent negatives include no blurred vision, chest pain, headaches, orthopnea, palpitations or shortness of breath. There is no history of kidney disease, CAD/MI or CVA.  Hyperlipidemia  This is a chronic problem. The problem is controlled. Recent lipid tests were reviewed and are normal. Pertinent negatives include no chest pain, leg pain, myalgias or shortness of breath. Current antihyperlipidemic treatment includes statins.     Past Medical History:  Diagnosis Date  . Diabetes mellitus without complication ([place])   . Hypertension   . Polycythemia     Past Surgical History:  Procedure Laterality Date  . CESAREAN SECTION    . CHOLECYSTECTOMY    . COLONOSCOPY N/A 12/12/2014   Procedure: COLONOSCOPY;  Surgeon: Lucilla Lame, MD;  Location: [place];  Service: Gastroenterology;  Laterality: N/A;  .  ESOPHAGOGASTRODUODENOSCOPY N/A 12/12/2014   Procedure: ESOPHAGOGASTRODUODENOSCOPY (EGD);  Surgeon: Lucilla Lame, MD;  Location: [place];  Service: Gastroenterology;  Laterality: N/A;  . GALLBLADDER SURGERY    . POLYPECTOMY  12/12/2014   Procedure: POLYPECTOMY;  Surgeon: Lucilla Lame, MD;  Location: [place];  Service: Gastroenterology;;    Family History  Problem Relation Age of Onset  . Stroke Mother   . Hypertension Mother   . Hypertension Father   . Stroke Father   . Heart disease Sister     Social History   Social History  . Marital status: Divorced    Spouse name: N/A  . Number of children: N/A  . Years of education: N/A   Occupational History  . Not on file.   Social History Main Topics  . Smoking status: Current Every Day Smoker    Packs/day: 0.50    Types: Cigarettes  . Smokeless tobacco: Never Used  . Alcohol use 0.6 oz/week    1 Glasses of wine per week  . Drug use: Yes    Frequency: 2.0 times per week    Types: Marijuana  . Sexual activity: Not on file   Other Topics Concern  . Not on file   Social History Narrative  . No narrative on file     Current Outpatient Prescriptions:  .  ACCU-CHEK SMARTVIEW test strip, as directed., Disp: , Rfl:  .  Eluxadoline (VIBERZI) 75 MG TABS, Take 75 mg by mouth 2 (two) times daily., Disp: 60 tablet, Rfl: 5 .  FLUoxetine (PROZAC) 20 MG tablet, Take 1 tablet (20 mg total) by mouth daily., Disp: 90 tablet, Rfl: 0 .  glipiZIDE (GLUCOTROL) 5 MG tablet, Take 1 tablet (5 mg total) by mouth daily before breakfast., Disp: 90 tablet, Rfl: 0 .  Incontinence Supply Disposable (BLADDER CONTROL PADS EX ABSORB) MISC, daily., Disp: , Rfl:  .  losartan (COZAAR) 100 MG tablet, Take 1 tablet (100 mg total) by mouth daily., Disp: 90 tablet, Rfl: 0 .  methocarbamol (ROBAXIN) 500 MG tablet, Take 1 tablet (500 mg total) by mouth every 8 (eight) hours as needed for muscle spasms., Disp: 90 tablet, Rfl: 0 .  Multiple  Vitamins-Minerals (WOMENS MULTIVITAMIN PLUS PO), daily., Disp: , Rfl:  .  pantoprazole (PROTONIX) 40 MG tablet, Take 1 tablet (40 mg total) by mouth daily before breakfast., Disp: 30 tablet, Rfl: 11 .  rosuvastatin (CRESTOR) 10 MG tablet, Take 1 tablet (10 mg total) by mouth daily., Disp: 90 tablet, Rfl: 0 .  VENTOLIN HFA 108 (90 Base) MCG/ACT inhaler, INHALE 2 PUFFS BY MOUTH EVERY 6 HOURS ASNEEDED FOR WHEEZING OR SHORTNESS OF BREATH, Disp: 18 g, Rfl: 2 .  empagliflozin (JARDIANCE) 10 MG TABS tablet, Take 10 mg by mouth daily. (Patient not taking: Reported on 08/06/2016), Disp: 90 tablet, Rfl: 0  Allergies  Allergen Reactions  . Morphine Sulfate Itching     Review of Systems  Constitutional: Negative for fatigue.  Eyes: Negative for blurred vision.  Respiratory: Negative for shortness of breath.   Cardiovascular: Negative for chest pain, palpitations and orthopnea.  Musculoskeletal: Negative for myalgias.  Neurological: Negative for headaches.  Endo/Heme/Allergies: Negative for polydipsia.      Objective  Vitals:   08/06/16 1012  BP: 132/84  Pulse: (!) 103  Resp: 16  Temp: 98.8 F (37.1 C)  TempSrc: Oral  SpO2: 98%  Weight: 184 lb 6.4 oz (83.6 kg)  Height: 4\' 10"  (1.473 m)    Physical Exam  Constitutional: She is oriented to person, place, and time and well-developed, well-nourished, and in no distress.  HENT:  Head: Normocephalic and atraumatic.  Cardiovascular: Normal rate, regular rhythm and normal heart sounds.   No murmur heard. Pulmonary/Chest: Effort normal and breath sounds normal. She has no wheezes.  Abdominal: Soft. Bowel sounds are normal. There is no tenderness.  Musculoskeletal: She exhibits no edema.  Neurological: She is alert and oriented to person, place, and time.  Psychiatric: Mood, memory, affect and judgment normal.  Nursing note and vitals reviewed.      Recent Results (from the past 2160 hour(s))  POCT HgB A1C     Status: None    Collection Time: 08/06/16 10:17 AM  Result Value Ref Range   Hemoglobin A1C 7.2   POCT Glucose (CBG)     Status: Abnormal   Collection Time: 08/06/16 10:18 AM  Result Value Ref Range   POC Glucose 168 (A) 70 - 99 mg/dl     Assessment & Plan  1. Controlled type 2 diabetes mellitus without complication, without long-term current use of insulin (HCC)  A1c 7.2%, well-controlled diabetes. Advised to continue on current pharmacotherapy as prescribed. - POCT HgB A1C - POCT Glucose (CBG) - empagliflozin (JARDIANCE) 10 MG TABS tablet; Take 10 mg by mouth daily.  Dispense: 90 tablet; Refill: 0 - glipiZIDE (GLUCOTROL) 5 MG tablet; Take 1 tablet (5 mg total) by mouth daily before breakfast.  Dispense: 90 tablet; Refill: 0  2. Dyslipidemia associated with type 2 diabetes mellitus (HCC)  - rosuvastatin (CRESTOR) 10 MG tablet; Take 1 tablet (10 mg total) by mouth daily.  Dispense: 90 tablet; Refill: 0  3. Recurrent major  depressive disorder, in partial remission [facility]) Patient feels irritable, has been on Prozac in the past, which was stopped. He is requesting a lower potency antidepressant. We'll start on Wellbutrin - buPROPion (WELLBUTRIN XL) 150 MG 24 hr tablet; Take 1 tablet (150 mg total) by mouth daily.  Dispense: 90 tablet; Refill: 0  4. Chronic bronchitis, unspecified chronic bronchitis type ([place]) Persistent symptoms of coughing and congestion, current smoker. Symptoms not improving on rescue inhaler alone, we'll add Symbicort to her regimen - budesonide-formoterol (SYMBICORT) 160-4.5 MCG/ACT inhaler; Inhale 2 puffs into the lungs 2 (two) times daily.  Dispense: 1 Inhaler; Refill: 0  5. Frequent urination UA shows positive nitrites and leukocytes and moderate blood, consistent with infection. We will start on Macrobid for treatment - Urine Culture - POCT urinalysis dipstick - nitrofurantoin, macrocrystal-monohydrate, (MACROBID) 100 MG capsule; Take 1 capsule (100 mg total) by mouth 2  (two) times daily.  Dispense: 14 capsule; Refill: 0   Jerrik Housholder Asad [facility] 08/06/2016 10:26 AM

## 2016-08-06 NOTE — Telephone Encounter (Signed)
Pt states she was here this morning and was told she has a UTI. Pt states she did not get a antibiotic for it. Please send to [facility].

## 2016-08-08 LAB — URINE CULTURE

## 2016-08-09 NOTE — Telephone Encounter (Signed)
LMOM to inform pt °

## 2016-08-09 NOTE — Telephone Encounter (Signed)
Prescription for nitrofurantoin 100 mg by mouth twice a day 7 days has been sent to her pharmacy

## 2016-09-20 ENCOUNTER — Telehealth: Payer: Self-pay | Admitting: Family Medicine

## 2016-09-20 NOTE — Telephone Encounter (Signed)
Called Pt to schedule AWV with [facility] - knb °

## 2016-09-28 NOTE — Telephone Encounter (Signed)
appt made for 10/11/16

## 2016-10-11 ENCOUNTER — Ambulatory Visit (INDEPENDENT_AMBULATORY_CARE_PROVIDER_SITE_OTHER): Payer: Medicare HMO

## 2016-10-11 VITALS — BP 130/80 | HR 72 | Temp 97.5°F | Ht <= 58 in | Wt 175.8 lb

## 2016-10-11 DIAGNOSIS — Z0000 Encounter for general adult medical examination without abnormal findings: Secondary | ICD-10-CM

## 2016-10-11 NOTE — Progress Notes (Signed)
Subjective:   Tara Cochran is a 56 y.o. female who presents for Medicare Annual (Subsequent) preventive examination.  Review of Systems:  N/A  Cardiac Risk Factors include: advanced age (>61men, >41 women);diabetes mellitus;dyslipidemia;hypertension;obesity (BMI >30kg/m2);smoking/ tobacco exposure     Objective:     Vitals: BP 130/80 (BP Location: Left Arm)   Pulse 72   Temp 97.5 F (36.4 C) (Oral)   Ht 4\' 10"  (1.473 m)   Wt 175 lb 12.8 oz (79.7 kg)   BMI 36.74 kg/m   Body mass index is 36.74 kg/m.   Tobacco History  Smoking Status  . Current Every Day Smoker  . Packs/day: 0.50  . Types: Cigarettes  Smokeless Tobacco  . Never Used     Ready to quit: No Counseling given: No   Past Medical History:  Diagnosis Date  . Diabetes mellitus without complication ([place])   . Hypertension   . Polycythemia    Past Surgical History:  Procedure Laterality Date  . CESAREAN SECTION    . CHOLECYSTECTOMY    . COLONOSCOPY N/A 12/12/2014   Procedure: COLONOSCOPY;  Surgeon: Lucilla Lame, MD;  Location: [place];  Service: Gastroenterology;  Laterality: N/A;  . ESOPHAGOGASTRODUODENOSCOPY N/A 12/12/2014   Procedure: ESOPHAGOGASTRODUODENOSCOPY (EGD);  Surgeon: Lucilla Lame, MD;  Location: [place];  Service: Gastroenterology;  Laterality: N/A;  . GALLBLADDER SURGERY    . POLYPECTOMY  12/12/2014   Procedure: POLYPECTOMY;  Surgeon: Lucilla Lame, MD;  Location: [place];  Service: Gastroenterology;;   Family History  Problem Relation Age of Onset  . Stroke Mother   . Hypertension Mother   . Hypertension Father   . Stroke Father   . Heart disease Sister    History  Sexual Activity  . Sexual activity: Not on file    Outpatient Encounter Prescriptions as of 10/11/2016  Medication Sig  . ACCU-CHEK SMARTVIEW test strip as directed.  . budesonide-formoterol (SYMBICORT) 160-4.5 MCG/ACT inhaler Inhale 2 puffs into the lungs 2 (two) times daily.    Marland Kitchen buPROPion (WELLBUTRIN XL) 150 MG 24 hr tablet Take 1 tablet (150 mg total) by mouth daily.  . Eluxadoline (VIBERZI) 75 MG TABS Take 75 mg by mouth 2 (two) times daily.  . empagliflozin (JARDIANCE) 10 MG TABS tablet Take 10 mg by mouth daily.  Marland Kitchen glipiZIDE (GLUCOTROL) 5 MG tablet Take 1 tablet (5 mg total) by mouth daily before breakfast.  . losartan (COZAAR) 100 MG tablet Take 1 tablet (100 mg total) by mouth daily.  . Multiple Vitamins-Minerals (HAIR SKIN AND NAILS FORMULA) TABS Take by mouth.  . Multiple Vitamins-Minerals (WOMENS MULTIVITAMIN PLUS PO) daily.  . rosuvastatin (CRESTOR) 10 MG tablet Take 1 tablet (10 mg total) by mouth daily.  . VENTOLIN HFA 108 (90 Base) MCG/ACT inhaler INHALE 2 PUFFS BY MOUTH EVERY 6 HOURS ASNEEDED FOR WHEEZING OR SHORTNESS OF BREATH  . Incontinence Supply Disposable (BLADDER CONTROL PADS EX ABSORB) MISC daily.  . methocarbamol (ROBAXIN) 500 MG tablet Take 1 tablet (500 mg total) by mouth every 8 (eight) hours as needed for muscle spasms. (Patient not taking: Reported on 10/11/2016)  . nitrofurantoin, macrocrystal-monohydrate, (MACROBID) 100 MG capsule Take 1 capsule (100 mg total) by mouth 2 (two) times daily. (Patient not taking: Reported on 10/11/2016)  . pantoprazole (PROTONIX) 40 MG tablet Take 1 tablet (40 mg total) by mouth daily before breakfast. (Patient not taking: Reported on 10/11/2016)   No facility-administered encounter medications on file as of 10/11/2016.  Activities of Daily Living In your present state of health, do you have any difficulty performing the following activities: 10/11/2016 02/04/2016  Hearing? N N  Vision? N Y  Difficulty concentrating or making decisions? N N  Walking or climbing stairs? N Y  Dressing or bathing? N N  Doing errands, shopping? N N  Preparing Food and eating ? N -  Using the Toilet? N -  In the past six months, have you accidently leaked urine? N -  Do you have problems with loss of bowel control? N -   Managing your Medications? N -  Managing your Finances? N -  Housekeeping or managing your Housekeeping? N -  Some recent data might be hidden    Patient Care Team: Roselee Nova, MD as PCP - General (Family Medicine) Lucilla Lame, MD as Consulting Physician (Gastroenterology)    Assessment:     Exercise Activities and Dietary recommendations Current Exercise Habits: The patient does not participate in regular exercise at present, Exercise limited by: None identified  Goals    . Increase water intake          Recommend increasing water intake to 6 glasses a day.      Fall Risk Fall Risk  10/11/2016 02/04/2016 11/04/2015 08/07/2015 02/18/2015  Falls in the past year? No No No No No   Depression Screen PHQ 2/9 Scores 10/11/2016 02/04/2016 11/04/2015 08/07/2015  PHQ - 2 Score 0 0 0 0  PHQ- 9 Score - - - -     Cognitive Function     6CIT Screen 10/11/2016  What Year? 0 points  What month? 0 points  What time? 0 points  Count back from 20 0 points  Months in reverse 0 points  Repeat phrase 0 points  Total Score 0    Immunization History  Administered Date(s) Administered  . Influenza,inj,Quad PF,36+ Mos 07/11/2015, 05/05/2016  . Pneumococcal Conjugate-13 05/05/2016   Screening Tests Health Maintenance  Topic Date Due  . PAP SMEAR  11/03/2016 (Originally 01/12/1983)  . HIV Screening  11/03/2016 (Originally 01/11/1977)  . OPHTHALMOLOGY EXAM  09/26/2017 (Originally 01/12/1972)  . TETANUS/TDAP  06/28/2026 (Originally 01/11/1981)  . FOOT EXAM  11/03/2016  . INFLUENZA VACCINE  01/26/2017  . HEMOGLOBIN A1C  02/03/2017  . MAMMOGRAM  06/11/2017  . COLONOSCOPY  12/12/2019  . PNEUMOCOCCAL POLYSACCHARIDE VACCINE (2) 05/05/2021  . Hepatitis C Screening  Completed      Plan:  I have personally reviewed and addressed the Medicare Annual Wellness questionnaire and have noted the following in the patient's chart:  A. Medical and social history B. Use of alcohol, tobacco or illicit  drugs  C. Current medications and supplements D. Functional ability and status E.  Nutritional status F.  Physical activity G. Advance directives H. List of other physicians I.  Hospitalizations, surgeries, and ER visits in previous 12 months J.  [place] such as hearing and vision if needed, cognitive and depression L. Referrals and appointments - none  In addition, I have reviewed and discussed with patient certain preventive protocols, quality metrics, and best practice recommendations. A written personalized care plan for preventive services as well as general preventive health recommendations were provided to patient.  See attached scanned questionnaire for additional information.   Signed,  Fabio Neighbors, LPN Nurse Health Advisor   MD Recommendations: Pt needs pap smear and HIV screening at appointment with PCP. Pt declined tetanus and will have an eye exam done sometime this year.  I, as supervising physician, have reviewed the nurse health advisor's Medicare Wellness Visit note for this patient and concur with the findings and recommendations listed above.  Signed Syed Asad A. Manuella Ghazi MD Attending Physician.

## 2016-10-11 NOTE — Patient Instructions (Signed)
Tara Cochran , Thank you for taking time to come for your Medicare Wellness Visit. I appreciate your ongoing commitment to your health goals. Please review the following plan we discussed and let me know if I can assist you in the future.   Screening recommendations/referrals: Colonoscopy: last done 12/12/14 Mammogram: last done 06/12/15, currently due Recommended yearly ophthalmology/optometry visit for glaucoma screening and checkup Recommended yearly dental visit for hygiene and checkup  Vaccinations: Influenza vaccine: done 05/05/16 Pneumococcal vaccine: last done 05/05/16, due after age 32 Tdap vaccine: declined    Advanced directives: declined  Next appointment: 11/04/16 @ 9:40 AM  Preventive Care 40-64 Years, Female Preventive care refers to lifestyle choices and visits with your health care provider that can promote health and wellness. What does preventive care include?  A yearly physical exam. This is also called an annual well check.  Dental exams once or twice a year.  Routine eye exams. Ask your health care provider how often you should have your eyes checked.  Personal lifestyle choices, including:  Daily care of your teeth and gums.  Regular physical activity.  Eating a healthy diet.  Avoiding tobacco and drug use.  Limiting alcohol use.  Practicing safe sex.  Taking low-dose aspirin daily starting at age 12.  Taking vitamin and mineral supplements as recommended by your health care provider. What happens during an annual well check? The services and screenings done by your health care provider during your annual well check will depend on your age, overall health, lifestyle risk factors, and family history of disease. Counseling  Your health care provider may ask you questions about your:  Alcohol use.  Tobacco use.  Drug use.  Emotional well-being.  Home and relationship well-being.  Sexual activity.  Eating habits.  Work and work  Statistician.  Method of birth control.  Menstrual cycle.  Pregnancy history. Screening  You may have the following tests or measurements:  Height, weight, and BMI.  Blood pressure.  Lipid and cholesterol levels. These may be checked every 5 years, or more frequently if you are over 74 years old.  Skin check.  Lung cancer screening. You may have this screening every year starting at age 93 if you have a 30-pack-year history of smoking and currently smoke or have quit within the past 15 years.  Fecal occult blood test (FOBT) of the stool. You may have this test every year starting at age 36.  Flexible sigmoidoscopy or colonoscopy. You may have a sigmoidoscopy every 5 years or a colonoscopy every 10 years starting at age 59.  Hepatitis C blood test.  Hepatitis B blood test.  Sexually transmitted disease (STD) testing.  Diabetes screening. This is done by checking your blood sugar (glucose) after you have not eaten for a while (fasting). You may have this done every 1-3 years.  Mammogram. This may be done every 1-2 years. Talk to your health care provider about when you should start having regular mammograms. This may depend on whether you have a family history of breast cancer.  BRCA-related cancer screening. This may be done if you have a family history of breast, ovarian, tubal, or peritoneal cancers.  Pelvic exam and Pap test. This may be done every 3 years starting at age 17. Starting at age 68, this may be done every 5 years if you have a Pap test in combination with an HPV test.  Bone density scan. This is done to screen for osteoporosis. You may have this scan if you  are at high risk for osteoporosis. Discuss your test results, treatment options, and if necessary, the need for more tests with your health care provider. Vaccines  Your health care provider may recommend certain vaccines, such as:  Influenza vaccine. This is recommended every year.  Tetanus, diphtheria,  and acellular pertussis (Tdap, Td) vaccine. You may need a Td booster every 10 years.  Zoster vaccine. You may need this after age 67.  Pneumococcal 13-valent conjugate (PCV13) vaccine. You may need this if you have certain conditions and were not previously vaccinated.  Pneumococcal polysaccharide (PPSV23) vaccine. You may need one or two doses if you smoke cigarettes or if you have certain conditions. Talk to your health care provider about which screenings and vaccines you need and how often you need them. This information is not intended to replace advice given to you by your health care provider. Make sure you discuss any questions you have with your health care provider. Document Released: 07/11/2015 Document Revised: 03/03/2016 Document Reviewed: 04/15/2015 Elsevier Interactive Patient Education  2017 [place] Prevention in the Home Falls can cause injuries. They can happen to people of all ages. There are many things you can do to make your home safe and to help prevent falls. What can I do on the outside of my home?  Regularly fix the edges of walkways and driveways and fix any cracks.  Remove anything that might make you trip as you walk through a door, such as a raised step or threshold.  Trim any bushes or trees on the path to your home.  Use bright outdoor lighting.  Clear any walking paths of anything that might make someone trip, such as rocks or tools.  Regularly check to see if handrails are loose or broken. Make sure that both sides of any steps have handrails.  Any raised decks and porches should have guardrails on the edges.  Have any leaves, snow, or ice cleared regularly.  Use sand or salt on walking paths during winter.  Clean up any spills in your garage right away. This includes oil or grease spills. What can I do in the bathroom?  Use night lights.  Install grab bars by the toilet and in the tub and shower. Do not use towel bars as grab  bars.  Use non-skid mats or decals in the tub or shower.  If you need to sit down in the shower, use a plastic, non-slip stool.  Keep the floor dry. Clean up any water that spills on the floor as soon as it happens.  Remove soap buildup in the tub or shower regularly.  Attach bath mats securely with double-sided non-slip rug tape.  Do not have throw rugs and other things on the floor that can make you trip. What can I do in the bedroom?  Use night lights.  Make sure that you have a light by your bed that is easy to reach.  Do not use any sheets or blankets that are too big for your bed. They should not hang down onto the floor.  Have a firm chair that has side arms. You can use this for support while you get dressed.  Do not have throw rugs and other things on the floor that can make you trip. What can I do in the kitchen?  Clean up any spills right away.  Avoid walking on wet floors.  Keep items that you use a lot in easy-to-reach places.  If you need  to reach something above you, use a strong step stool that has a grab bar.  Keep electrical cords out of the way.  Do not use floor polish or wax that makes floors slippery. If you must use wax, use non-skid floor wax.  Do not have throw rugs and other things on the floor that can make you trip. What can I do with my stairs?  Do not leave any items on the stairs.  Make sure that there are handrails on both sides of the stairs and use them. Fix handrails that are broken or loose. Make sure that handrails are as long as the stairways.  Check any carpeting to make sure that it is firmly attached to the stairs. Fix any carpet that is loose or worn.  Avoid having throw rugs at the top or bottom of the stairs. If you do have throw rugs, attach them to the floor with carpet tape.  Make sure that you have a light switch at the top of the stairs and the bottom of the stairs. If you do not have them, ask someone to add them for  you. What else can I do to help prevent falls?  Wear shoes that:  Do not have high heels.  Have rubber bottoms.  Are comfortable and fit you well.  Are closed at the toe. Do not wear sandals.  If you use a stepladder:  Make sure that it is fully opened. Do not climb a closed stepladder.  Make sure that both sides of the stepladder are locked into place.  Ask someone to hold it for you, if possible.  Clearly mark and make sure that you can see:  Any grab bars or handrails.  First and last steps.  Where the edge of each step is.  Use tools that help you move around (mobility aids) if they are needed. These include:  Canes.  Walkers.  Scooters.  Crutches.  Turn on the lights when you go into a dark area. Replace any light bulbs as soon as they burn out.  Set up your furniture so you have a clear path. Avoid moving your furniture around.  If any of your floors are uneven, fix them.  If there are any pets around you, be aware of where they are.  Review your medicines with your doctor. Some medicines can make you feel dizzy. This can increase your chance of falling. Ask your doctor what other things that you can do to help prevent falls. This information is not intended to replace advice given to you by your health care provider. Make sure you discuss any questions you have with your health care provider. Document Released: 04/10/2009 Document Revised: 11/20/2015 Document Reviewed: 07/19/2014 Elsevier Interactive Patient Education  2017 [employer].

## 2016-10-25 ENCOUNTER — Emergency Department
Admission: EM | Admit: 2016-10-25 | Discharge: 2016-10-25 | Disposition: A | Discharge status: Home / Self Care | Payer: Medicare Other | Source: Home / Self Care

## 2016-10-25 ENCOUNTER — Encounter: Payer: Self-pay | Admitting: *Deleted

## 2016-10-25 DIAGNOSIS — I10 Essential (primary) hypertension: Secondary | ICD-10-CM

## 2016-10-25 DIAGNOSIS — E119 Type 2 diabetes mellitus without complications: Secondary | ICD-10-CM | POA: Insufficient documentation

## 2016-10-25 DIAGNOSIS — E785 Hyperlipidemia, unspecified: Secondary | ICD-10-CM | POA: Diagnosis present

## 2016-10-25 DIAGNOSIS — Z79899 Other long term (current) drug therapy: Secondary | ICD-10-CM

## 2016-10-25 DIAGNOSIS — E669 Obesity, unspecified: Secondary | ICD-10-CM | POA: Diagnosis not present

## 2016-10-25 DIAGNOSIS — B182 Chronic viral hepatitis C: Secondary | ICD-10-CM | POA: Diagnosis present

## 2016-10-25 DIAGNOSIS — R109 Unspecified abdominal pain: Secondary | ICD-10-CM | POA: Insufficient documentation

## 2016-10-25 DIAGNOSIS — Z8249 Family history of ischemic heart disease and other diseases of the circulatory system: Secondary | ICD-10-CM

## 2016-10-25 DIAGNOSIS — Z9049 Acquired absence of other specified parts of digestive tract: Secondary | ICD-10-CM | POA: Diagnosis not present

## 2016-10-25 DIAGNOSIS — Z885 Allergy status to narcotic agent status: Secondary | ICD-10-CM

## 2016-10-25 DIAGNOSIS — B9620 Unspecified Escherichia coli [E. coli] as the cause of diseases classified elsewhere: Secondary | ICD-10-CM | POA: Diagnosis not present

## 2016-10-25 DIAGNOSIS — Z7984 Long term (current) use of oral hypoglycemic drugs: Secondary | ICD-10-CM | POA: Diagnosis not present

## 2016-10-25 DIAGNOSIS — D751 Secondary polycythemia: Secondary | ICD-10-CM | POA: Diagnosis present

## 2016-10-25 DIAGNOSIS — F329 Major depressive disorder, single episode, unspecified: Secondary | ICD-10-CM | POA: Diagnosis present

## 2016-10-25 DIAGNOSIS — N136 Pyonephrosis: Secondary | ICD-10-CM | POA: Diagnosis not present

## 2016-10-25 DIAGNOSIS — K210 Gastro-esophageal reflux disease with esophagitis: Secondary | ICD-10-CM | POA: Diagnosis not present

## 2016-10-25 DIAGNOSIS — N179 Acute kidney failure, unspecified: Secondary | ICD-10-CM | POA: Diagnosis present

## 2016-10-25 DIAGNOSIS — A419 Sepsis, unspecified organism: Secondary | ICD-10-CM | POA: Diagnosis not present

## 2016-10-25 DIAGNOSIS — Z5321 Procedure and treatment not carried out due to patient leaving prior to being seen by health care provider: Secondary | ICD-10-CM | POA: Insufficient documentation

## 2016-10-25 DIAGNOSIS — N3090 Cystitis, unspecified without hematuria: Secondary | ICD-10-CM | POA: Diagnosis not present

## 2016-10-25 DIAGNOSIS — E871 Hypo-osmolality and hyponatremia: Secondary | ICD-10-CM | POA: Diagnosis not present

## 2016-10-25 DIAGNOSIS — F17210 Nicotine dependence, cigarettes, uncomplicated: Secondary | ICD-10-CM

## 2016-10-25 DIAGNOSIS — Z87440 Personal history of urinary (tract) infections: Secondary | ICD-10-CM

## 2016-10-25 DIAGNOSIS — Z7951 Long term (current) use of inhaled steroids: Secondary | ICD-10-CM | POA: Diagnosis not present

## 2016-10-25 DIAGNOSIS — Z6838 Body mass index (BMI) 38.0-38.9, adult: Secondary | ICD-10-CM

## 2016-10-25 DIAGNOSIS — N201 Calculus of ureter: Secondary | ICD-10-CM | POA: Diagnosis present

## 2016-10-25 LAB — URINALYSIS, COMPLETE (UACMP) WITH MICROSCOPIC
BILIRUBIN URINE: NEGATIVE
Glucose, UA: >=500 mg/dL — AB
KETONES UR: NEGATIVE mg/dL
Nitrite: POSITIVE — AB
PH: 7 (ref 5.0–8.0)
Protein, ur: 100 mg/dL — AB
Specific Gravity, Urine: 1.018 (ref 1.005–1.030)

## 2016-10-25 LAB — CBC
HCT: 47.9 % — ABNORMAL HIGH (ref 35.0–47.0)
Hemoglobin: 16.3 g/dL — ABNORMAL HIGH (ref 12.0–16.0)
MCH: 31 pg (ref 26.0–34.0)
MCHC: 34 g/dL (ref 32.0–36.0)
MCV: 91 fL (ref 80.0–100.0)
PLATELETS: 271 10*3/uL (ref 150–440)
RBC: 5.27 MIL/uL — AB (ref 3.80–5.20)
RDW: 13.1 % (ref 11.5–14.5)
WBC: 14.8 10*3/uL — ABNORMAL HIGH (ref 3.6–11.0)

## 2016-10-25 LAB — COMPREHENSIVE METABOLIC PANEL
ALBUMIN: 3.9 g/dL (ref 3.5–5.0)
ALK PHOS: 149 U/L — AB (ref 38–126)
ALT: 27 U/L (ref 14–54)
AST: 21 U/L (ref 15–41)
Anion gap: 8 (ref 5–15)
BUN: 19 mg/dL (ref 6–20)
CALCIUM: 9.5 mg/dL (ref 8.9–10.3)
CHLORIDE: 107 mmol/L (ref 101–111)
CO2: 26 mmol/L (ref 22–32)
CREATININE: 0.67 mg/dL (ref 0.44–1.00)
GFR calc Af Amer: >60 mL/min (ref >60)
GFR calc non Af Amer: >60 mL/min (ref >60)
GLUCOSE: 169 mg/dL — AB (ref 65–99)
Potassium: 4.5 mmol/L (ref 3.5–5.1)
SODIUM: 141 mmol/L (ref 135–145)
Total Bilirubin: 0.8 mg/dL (ref 0.3–1.2)
Total Protein: 7.4 g/dL (ref 6.5–8.1)

## 2016-10-25 LAB — LIPASE, BLOOD: Lipase: 26 U/L (ref 11–51)

## 2016-10-25 NOTE — ED Triage Notes (Signed)
Pt reports left side abd pain.  Pt has decreased appetite.  Pt denies urinary sx.  No n/v/d.  No back pain. Pt alert  Speech clear.

## 2016-10-25 NOTE — ED Notes (Signed)
Patient to stat desk stating that she is in too much pain and does not want to stay any longer. Patient advised of wait time. Patient still wanted to leave.

## 2016-10-26 ENCOUNTER — Telehealth: Payer: Self-pay | Admitting: Emergency Medicine

## 2016-10-26 ENCOUNTER — Emergency Department
Admission: EM | Admit: 2016-10-26 | Discharge: 2016-10-26 | Disposition: A | Discharge status: Home / Self Care | Payer: Medicare Other | Source: Home / Self Care | Attending: Emergency Medicine | Admitting: Emergency Medicine

## 2016-10-26 ENCOUNTER — Encounter: Payer: Self-pay | Admitting: Medical Oncology

## 2016-10-26 ENCOUNTER — Other Ambulatory Visit: Payer: Self-pay | Admitting: Family Medicine

## 2016-10-26 DIAGNOSIS — F17210 Nicotine dependence, cigarettes, uncomplicated: Secondary | ICD-10-CM | POA: Insufficient documentation

## 2016-10-26 DIAGNOSIS — Z79899 Other long term (current) drug therapy: Secondary | ICD-10-CM | POA: Insufficient documentation

## 2016-10-26 DIAGNOSIS — Z7984 Long term (current) use of oral hypoglycemic drugs: Secondary | ICD-10-CM | POA: Insufficient documentation

## 2016-10-26 DIAGNOSIS — E119 Type 2 diabetes mellitus without complications: Secondary | ICD-10-CM

## 2016-10-26 DIAGNOSIS — N3090 Cystitis, unspecified without hematuria: Secondary | ICD-10-CM | POA: Insufficient documentation

## 2016-10-26 DIAGNOSIS — I10 Essential (primary) hypertension: Secondary | ICD-10-CM

## 2016-10-26 LAB — URINALYSIS, COMPLETE (UACMP) WITH MICROSCOPIC
BILIRUBIN URINE: NEGATIVE
Glucose, UA: 50 mg/dL — AB
Ketones, ur: NEGATIVE mg/dL
Nitrite: POSITIVE — AB
Specific Gravity, Urine: 1.026 (ref 1.005–1.030)
pH: 7 (ref 5.0–8.0)

## 2016-10-26 MED ORDER — NITROFURANTOIN MONOHYD MACRO 100 MG PO CAPS: 100.0000 mg | ORAL_CAPSULE | Freq: Two times a day (BID) | ORAL | 0 refills | Status: AC

## 2016-10-26 MED ORDER — PHENAZOPYRIDINE HCL 100 MG PO TABS: 100.0000 mg | ORAL_TABLET | Freq: Three times a day (TID) | ORAL | 0 refills | Status: DC | PRN

## 2016-10-26 NOTE — Telephone Encounter (Signed)
Called patient due to lwot to inquire about condition and follow up plans. No answer.   Patient is back in ED now.

## 2016-10-26 NOTE — ED Provider Notes (Signed)
[facility] Emergency Department Provider Note  ____________________________________________  Time seen: Approximately 9:05 AM  I have reviewed the triage vital signs and the nursing notes.   HISTORY  Chief Complaint Urinary Tract Infection    HPI Tara Cochran is a 56 y.o. female that presents to the emergency department with dysuria, urgency, frequency for 2 days. She states that she was woke up in the middle of the night with abdominal pain on the left side. She denies any pain currently. She had a urinary tract infection recently and was put on Macrobid, which resolved infection. She has never had a kidney stone. She denies fever, shortness of breath, chest pain, nausea, vomiting, back pain.   Past Medical History:  Diagnosis Date  . Diabetes mellitus without complication ([place])   . Hypertension   . Polycythemia     Patient Active Problem List   Diagnosis Date Noted  . Hyperlipidemia 05/05/2016  . Elevated alkaline phosphatase level 11/04/2015  . Urinary urgency 08/07/2015  . Hypertension 08/07/2015  . Abnormal chest CT 05/26/2015  . Chronic radicular low back pain 02/18/2015  . Chronic bronchitis ([place]) 02/04/2015  . Dyslipidemia associated with type 2 diabetes mellitus ([place]) 02/04/2015  . Elevated hemoglobin ([place]) 12/19/2014  . Nausea   . Duodenitis   . Gastritis with hemorrhage   . Reflux esophagitis   . Loss of weight   . Rectal polyp   . H/O arthritis 12/02/2014  . Airway hyperreactivity 12/02/2014  . Backache 12/02/2014  . Barrett esophagus 12/02/2014  . CAFL (chronic airflow limitation) ([place]) 12/02/2014  . Depression 12/02/2014  . Controlled type 2 diabetes mellitus without complication, with long-term current use of insulin ([place]) 12/02/2014  . Chronic hepatitis C ([place]) 12/02/2014  . H/O: HTN (hypertension) 12/02/2014  . Diarrhea 12/02/2014  . Erythrocytosis 01/15/2013    Past Surgical History:  Procedure Laterality Date   . CESAREAN SECTION    . CHOLECYSTECTOMY    . COLONOSCOPY N/A 12/12/2014   Procedure: COLONOSCOPY;  Surgeon: Lucilla Lame, MD;  Location: [place];  Service: Gastroenterology;  Laterality: N/A;  . ESOPHAGOGASTRODUODENOSCOPY N/A 12/12/2014   Procedure: ESOPHAGOGASTRODUODENOSCOPY (EGD);  Surgeon: Lucilla Lame, MD;  Location: [place];  Service: Gastroenterology;  Laterality: N/A;  . GALLBLADDER SURGERY    . POLYPECTOMY  12/12/2014   Procedure: POLYPECTOMY;  Surgeon: Lucilla Lame, MD;  Location: [place];  Service: Gastroenterology;;    Prior to Admission medications   Medication Sig Start Date End Date Taking? Authorizing Provider  ACCU-CHEK SMARTVIEW test strip as directed. 11/06/14   Historical Provider, MD  budesonide-formoterol (SYMBICORT) 160-4.5 MCG/ACT inhaler Inhale 2 puffs into the lungs 2 (two) times daily. 08/06/16   Roselee Nova, MD  buPROPion (WELLBUTRIN XL) 150 MG 24 hr tablet Take 1 tablet (150 mg total) by mouth daily. 08/06/16   Roselee Nova, MD  Eluxadoline (VIBERZI) 75 MG TABS Take 75 mg by mouth 2 (two) times daily. 05/04/16   Lucilla Lame, MD  empagliflozin (JARDIANCE) 10 MG TABS tablet Take 10 mg by mouth daily. 08/06/16   Roselee Nova, MD  glipiZIDE (GLUCOTROL) 5 MG tablet Take 1 tablet (5 mg total) by mouth daily before breakfast. 08/06/16   Roselee Nova, MD  Incontinence Supply Disposable (BLADDER CONTROL PADS EX ABSORB) MISC daily. 08/27/14   Historical Provider, MD  losartan (COZAAR) 100 MG tablet TAKE 1 TABLET BY MOUTH DAILY 10/26/16   Roselee Nova, MD  methocarbamol (ROBAXIN) 500 MG tablet Take 1 tablet (500 mg total) by mouth every 8 (eight) hours as needed for muscle spasms. Patient not taking: Reported on 10/11/2016 05/05/16   Roselee Nova, MD  Multiple Vitamins-Minerals (HAIR SKIN AND NAILS FORMULA) TABS Take by mouth.    Historical Provider, MD  Multiple Vitamins-Minerals (WOMENS MULTIVITAMIN PLUS PO) daily. 08/27/14    Historical Provider, MD  nitrofurantoin, macrocrystal-monohydrate, (MACROBID) 100 MG capsule Take 1 capsule (100 mg total) by mouth 2 (two) times daily. 10/26/16 11/02/16  Laban Emperor, PA-C  pantoprazole (PROTONIX) 40 MG tablet Take 1 tablet (40 mg total) by mouth daily before breakfast. Patient not taking: Reported on 10/11/2016 12/12/14   Lucilla Lame, MD  phenazopyridine (PYRIDIUM) 100 MG tablet Take 1 tablet (100 mg total) by mouth 3 (three) times daily as needed for pain. 10/26/16 10/28/16  Laban Emperor, PA-C  rosuvastatin (CRESTOR) 10 MG tablet Take 1 tablet (10 mg total) by mouth daily. 08/06/16   Roselee Nova, MD  VENTOLIN HFA 108 [phone] Base) MCG/ACT inhaler INHALE 2 PUFFS BY MOUTH EVERY 6 HOURS ASNEEDED FOR WHEEZING OR SHORTNESS OF BREATH 07/01/16   Roselee Nova, MD    Allergies Morphine sulfate  Family History  Problem Relation Age of Onset  . Stroke Mother   . Hypertension Mother   . Hypertension Father   . Stroke Father   . Heart disease Sister     Social History Social History  Substance Use Topics  . Smoking status: Current Every Day Smoker    Packs/day: 0.50    Types: Cigarettes  . Smokeless tobacco: Never Used  . Alcohol use 0.0 - 0.6 oz/week     Review of Systems  Constitutional: No fever/chills Cardiovascular: No chest pain. Respiratory: No SOB. Gastrointestinal: No abdominal pain.  No nausea, no vomiting.  Genitourinary: Positive for dysuria, urgency, frequency. Musculoskeletal: Negative for musculoskeletal pain. Skin: Negative for rash, abrasions, lacerations, ecchymosis.   ____________________________________________   PHYSICAL EXAM:  VITAL SIGNS: ED Triage Vitals  Enc Vitals Group     BP 10/26/16 0812 (!) 167/85     Pulse Rate 10/26/16 0812 92     Resp 10/26/16 0812 18     Temp 10/26/16 0812 97.5 F (36.4 C)     Temp Source 10/26/16 0812 Oral     SpO2 10/26/16 0812 98 %     Weight --      Height --      Head Circumference --      Peak Flow  --      Pain Score 10/26/16 0811 8     Pain Loc --      Pain Edu? --      Excl. in [place]? --      Constitutional: Alert and oriented. Well appearing and in no acute distress. Eyes: Conjunctivae are normal. PERRL. EOMI. Head: Atraumatic. ENT:      Ears:      Nose: No congestion/rhinnorhea.      Mouth/Throat: Mucous membranes are moist.  Neck: No stridor.   Cardiovascular: Normal rate, regular rhythm.  Good peripheral circulation. Respiratory: Normal respiratory effort without tachypnea or retractions. Lungs CTAB. Good air entry to the bases with no decreased or absent breath sounds. Gastrointestinal: Bowel sounds 4 quadrants. Soft and nontender to palpation. No guarding or rigidity. No palpable masses. No distention. No CVA tenderness. Musculoskeletal: Full range of motion to all extremities. No gross deformities appreciated. Neurologic:  Normal speech and language. No gross  focal neurologic deficits are appreciated.  Skin:  Skin is warm, dry and intact. No rash noted.  ____________________________________________   LABS (all labs ordered are listed, but only abnormal results are displayed)  Labs Reviewed  URINALYSIS, COMPLETE (UACMP) WITH MICROSCOPIC - Abnormal; Notable for the following:       Result Value   Color, Urine AMBER (*)    APPearance TURBID (*)    Glucose, UA 50 (*)    Hgb urine dipstick SMALL (*)    Protein, ur >=300 (*)    Nitrite POSITIVE (*)    Leukocytes, UA MODERATE (*)    Bacteria, UA MANY (*)    Squamous Epithelial / LPF 0-5 (*)    All other components within normal limits   ____________________________________________  EKG   ____________________________________________  RADIOLOGY   No results found.  ____________________________________________    PROCEDURES  Procedure(s) performed:    Procedures    Medications - No data to display   ____________________________________________   INITIAL IMPRESSION / ASSESSMENT AND PLAN / ED  COURSE  Pertinent labs & imaging results that were available during my care of the patient were reviewed by me and considered in my medical decision making (see chart for details).  Review of the [place] CSRS was performed in accordance of the [place] prior to dispensing any controlled drugs.     Patient's diagnosis is consistent with urinary tract infection. Vital signs, labwork, and exam are reassuring. Urinalysis consistent with infection.  Patient was having abdominal pain last night but is not having any abdominal pain currently. Abdomen is soft and nontender. Macrobid worked for patient last time. Patient will be discharged home with prescriptions for Macrobid and Pyridium. Patient is to follow up with PCP as directed. Patient is given ED precautions to return to the ED for any worsening or new symptoms.     ____________________________________________  FINAL CLINICAL IMPRESSION(S) / ED DIAGNOSES  Final diagnoses:  Cystitis      NEW MEDICATIONS STARTED DURING THIS VISIT:  Discharge Medication List as of 10/26/2016  9:13 AM    START taking these medications   Details  phenazopyridine (PYRIDIUM) 100 MG tablet Take 1 tablet (100 mg total) by mouth 3 (three) times daily as needed for pain., Starting Tue 10/26/2016, Until Thu 10/28/2016, Print            This chart was dictated using voice recognition software/Dragon. Despite best efforts to proofread, errors can occur which can change the meaning. Any change was purely unintentional.    Laban Emperor, PA-C 10/26/16 1106    Eula Listen, MD 10/26/16 [phone]

## 2016-10-26 NOTE — ED Triage Notes (Signed)
Pt here last night for UTI sx, left due to the wait and returns today.

## 2016-10-26 NOTE — ED Notes (Signed)
Pt presents with hx of UTI's . She states she has awakened 2 nights in a row with lower abdominal pain. She has some pain upon urination, as well as increased frequency. She reports that she was here last night, has blood drawn and urinalysis but left after a 4 hour wait. NAD Noted.

## 2016-10-26 NOTE — ED Notes (Signed)
Pt discharged home after verbalizing understanding of discharge instructions; nad noted. 

## 2016-10-28 ENCOUNTER — Emergency Department: Payer: Medicare Other | Admitting: Certified Registered Nurse Anesthetist

## 2016-10-28 ENCOUNTER — Encounter: Payer: Self-pay | Admitting: *Deleted

## 2016-10-28 ENCOUNTER — Encounter
Admission: EM | Disposition: A | Discharge status: Home / Self Care | Payer: Self-pay | Source: Home / Self Care | Attending: Internal Medicine

## 2016-10-28 ENCOUNTER — Inpatient Hospital Stay
Admission: EM | Admit: 2016-10-28 | Discharge: 2016-10-29 | DRG: 872 | Disposition: A | Discharge status: Home / Self Care | Payer: Medicare Other | Attending: Internal Medicine | Admitting: Internal Medicine

## 2016-10-28 ENCOUNTER — Encounter: Payer: Self-pay | Admitting: Family Medicine

## 2016-10-28 ENCOUNTER — Ambulatory Visit (INDEPENDENT_AMBULATORY_CARE_PROVIDER_SITE_OTHER): Payer: Medicare HMO | Admitting: Family Medicine

## 2016-10-28 ENCOUNTER — Ambulatory Visit
Admission: RE | Admit: 2016-10-28 | Discharge: 2016-10-28 | Disposition: A | Discharge status: Home / Self Care | Payer: Medicare Other | Source: Ambulatory Visit | Attending: Family Medicine | Admitting: Family Medicine

## 2016-10-28 ENCOUNTER — Other Ambulatory Visit: Payer: Self-pay

## 2016-10-28 VITALS — BP 148/75 | HR 120 | Temp 99.9°F | Resp 17 | Ht <= 58 in | Wt 174.5 lb

## 2016-10-28 DIAGNOSIS — N12 Tubulo-interstitial nephritis, not specified as acute or chronic: Secondary | ICD-10-CM | POA: Diagnosis not present

## 2016-10-28 DIAGNOSIS — A419 Sepsis, unspecified organism: Secondary | ICD-10-CM | POA: Diagnosis present

## 2016-10-28 DIAGNOSIS — N136 Pyonephrosis: Secondary | ICD-10-CM | POA: Diagnosis not present

## 2016-10-28 DIAGNOSIS — R109 Unspecified abdominal pain: Secondary | ICD-10-CM | POA: Diagnosis not present

## 2016-10-28 DIAGNOSIS — E785 Hyperlipidemia, unspecified: Secondary | ICD-10-CM | POA: Diagnosis not present

## 2016-10-28 DIAGNOSIS — N179 Acute kidney failure, unspecified: Secondary | ICD-10-CM | POA: Diagnosis not present

## 2016-10-28 DIAGNOSIS — Z79899 Other long term (current) drug therapy: Secondary | ICD-10-CM | POA: Diagnosis not present

## 2016-10-28 DIAGNOSIS — D751 Secondary polycythemia: Secondary | ICD-10-CM | POA: Diagnosis not present

## 2016-10-28 DIAGNOSIS — F329 Major depressive disorder, single episode, unspecified: Secondary | ICD-10-CM | POA: Diagnosis not present

## 2016-10-28 DIAGNOSIS — R350 Frequency of micturition: Secondary | ICD-10-CM | POA: Diagnosis not present

## 2016-10-28 DIAGNOSIS — F17210 Nicotine dependence, cigarettes, uncomplicated: Secondary | ICD-10-CM | POA: Diagnosis not present

## 2016-10-28 DIAGNOSIS — E669 Obesity, unspecified: Secondary | ICD-10-CM | POA: Diagnosis not present

## 2016-10-28 DIAGNOSIS — B182 Chronic viral hepatitis C: Secondary | ICD-10-CM | POA: Diagnosis not present

## 2016-10-28 DIAGNOSIS — N1330 Unspecified hydronephrosis: Secondary | ICD-10-CM | POA: Diagnosis not present

## 2016-10-28 DIAGNOSIS — Z7951 Long term (current) use of inhaled steroids: Secondary | ICD-10-CM | POA: Diagnosis not present

## 2016-10-28 DIAGNOSIS — N201 Calculus of ureter: Secondary | ICD-10-CM | POA: Diagnosis not present

## 2016-10-28 DIAGNOSIS — Z9049 Acquired absence of other specified parts of digestive tract: Secondary | ICD-10-CM | POA: Diagnosis not present

## 2016-10-28 DIAGNOSIS — B9620 Unspecified Escherichia coli [E. coli] as the cause of diseases classified elsewhere: Secondary | ICD-10-CM | POA: Diagnosis not present

## 2016-10-28 DIAGNOSIS — Z7984 Long term (current) use of oral hypoglycemic drugs: Secondary | ICD-10-CM | POA: Diagnosis not present

## 2016-10-28 DIAGNOSIS — E119 Type 2 diabetes mellitus without complications: Secondary | ICD-10-CM | POA: Diagnosis not present

## 2016-10-28 DIAGNOSIS — N390 Urinary tract infection, site not specified: Secondary | ICD-10-CM | POA: Diagnosis not present

## 2016-10-28 DIAGNOSIS — R3915 Urgency of urination: Secondary | ICD-10-CM

## 2016-10-28 DIAGNOSIS — E871 Hypo-osmolality and hyponatremia: Secondary | ICD-10-CM | POA: Diagnosis not present

## 2016-10-28 DIAGNOSIS — N200 Calculus of kidney: Secondary | ICD-10-CM | POA: Diagnosis not present

## 2016-10-28 DIAGNOSIS — I10 Essential (primary) hypertension: Secondary | ICD-10-CM | POA: Diagnosis not present

## 2016-10-28 DIAGNOSIS — K210 Gastro-esophageal reflux disease with esophagitis: Secondary | ICD-10-CM | POA: Diagnosis not present

## 2016-10-28 HISTORY — PX: CYSTOSCOPY WITH RETROGRADE PYELOGRAM, URETEROSCOPY AND STENT PLACEMENT: SHX5789

## 2016-10-28 LAB — URINALYSIS, COMPLETE (UACMP) WITH MICROSCOPIC
Bacteria, UA: NONE SEEN
Bilirubin Urine: NEGATIVE
GLUCOSE, UA: 150 mg/dL — AB
Ketones, ur: 5 mg/dL — AB
NITRITE: POSITIVE — AB
Protein, ur: 30 mg/dL — AB
Specific Gravity, Urine: >1.046 — ABNORMAL HIGH (ref 1.005–1.030)
pH: 5 (ref 5.0–8.0)

## 2016-10-28 LAB — COMPLETE METABOLIC PANEL WITH GFR
ALBUMIN: 3.3 g/dL — AB (ref 3.6–5.1)
ALT: 21 U/L (ref 6–29)
AST: 16 U/L (ref 10–35)
Alkaline Phosphatase: 142 U/L — ABNORMAL HIGH (ref 33–130)
BUN: 15 mg/dL (ref 7–25)
CALCIUM: 9.1 mg/dL (ref 8.6–10.4)
CHLORIDE: 99 mmol/L (ref 98–110)
CO2: 23 mmol/L (ref 20–31)
CREATININE: 0.87 mg/dL (ref 0.50–1.05)
GFR, EST AFRICAN AMERICAN: 87 mL/min (ref >=60)
GFR, EST NON AFRICAN AMERICAN: 76 mL/min (ref >=60)
Glucose, Bld: 280 mg/dL — ABNORMAL HIGH (ref 65–99)
Potassium: 4.1 mmol/L (ref 3.5–5.3)
Sodium: 132 mmol/L — ABNORMAL LOW (ref 135–146)
TOTAL PROTEIN: 6.5 g/dL (ref 6.1–8.1)
Total Bilirubin: 0.8 mg/dL (ref 0.2–1.2)

## 2016-10-28 LAB — CBC WITH DIFFERENTIAL/PLATELET
BASOS ABS: 0 {cells}/uL (ref 0–200)
Basophils Relative: 0 %
EOS ABS: 0 {cells}/uL — AB (ref 15–500)
Eosinophils Relative: 0 %
HEMATOCRIT: 43.8 % (ref 35.0–45.0)
HEMOGLOBIN: 15 g/dL (ref 11.7–15.5)
LYMPHS ABS: 665 {cells}/uL — AB (ref 850–3900)
Lymphocytes Relative: 5 %
MCH: 31.3 pg (ref 27.0–33.0)
MCHC: 34.2 g/dL (ref 32.0–36.0)
MCV: 91.4 fL (ref 80.0–100.0)
MONO ABS: 1463 {cells}/uL — AB (ref 200–950)
MONOS PCT: 11 %
MPV: 10.2 fL (ref 7.5–12.5)
NEUTROS ABS: 11172 {cells}/uL — AB (ref 1500–7800)
Neutrophils Relative %: 84 %
Platelets: 253 10*3/uL (ref 140–400)
RBC: 4.79 MIL/uL (ref 3.80–5.10)
RDW: 12.6 % (ref 11.0–15.0)
WBC: 13.3 10*3/uL — ABNORMAL HIGH (ref 3.8–10.8)

## 2016-10-28 LAB — BASIC METABOLIC PANEL
Anion gap: 8 (ref 5–15)
BUN: 17 mg/dL (ref 6–20)
CO2: 25 mmol/L (ref 22–32)
Calcium: 9.1 mg/dL (ref 8.9–10.3)
Chloride: 98 mmol/L — ABNORMAL LOW (ref 101–111)
Creatinine, Ser: 1.01 mg/dL — ABNORMAL HIGH (ref 0.44–1.00)
GFR calc Af Amer: >60 mL/min (ref >60)
GFR calc non Af Amer: >60 mL/min (ref >60)
GLUCOSE: 231 mg/dL — AB (ref 65–99)
POTASSIUM: 4.4 mmol/L (ref 3.5–5.1)
Sodium: 131 mmol/L — ABNORMAL LOW (ref 135–145)

## 2016-10-28 LAB — POCT URINALYSIS DIPSTICK
Glucose, UA: 2
Nitrite, UA: POSITIVE
PH UA: 6 (ref 5.0–8.0)
Protein, UA: 100
Spec Grav, UA: 1.015 (ref 1.010–1.025)
Urobilinogen, UA: 1 E.U./dL

## 2016-10-28 LAB — CBC
HEMATOCRIT: 41 % (ref 35.0–47.0)
Hemoglobin: 14.1 g/dL (ref 12.0–16.0)
MCH: 30.8 pg (ref 26.0–34.0)
MCHC: 34.5 g/dL (ref 32.0–36.0)
MCV: 89.2 fL (ref 80.0–100.0)
Platelets: 254 10*3/uL (ref 150–440)
RBC: 4.59 MIL/uL (ref 3.80–5.20)
RDW: 13 % (ref 11.5–14.5)
WBC: 13.8 10*3/uL — ABNORMAL HIGH (ref 3.6–11.0)

## 2016-10-28 LAB — GLUCOSE, CAPILLARY: Glucose-Capillary: 169 mg/dL — ABNORMAL HIGH (ref 65–99)

## 2016-10-28 LAB — HEPATIC FUNCTION PANEL
ALT: 25 U/L (ref 14–54)
AST: 30 U/L (ref 15–41)
Albumin: 3.3 g/dL — ABNORMAL LOW (ref 3.5–5.0)
Alkaline Phosphatase: 130 U/L — ABNORMAL HIGH (ref 38–126)
BILIRUBIN DIRECT: 0.3 mg/dL (ref 0.1–0.5)
BILIRUBIN TOTAL: 1 mg/dL (ref 0.3–1.2)
Indirect Bilirubin: 0.7 mg/dL (ref 0.3–0.9)
Total Protein: 7.1 g/dL (ref 6.5–8.1)

## 2016-10-28 LAB — LIPASE, BLOOD: LIPASE: 142 U/L — AB (ref 11–51)

## 2016-10-28 SURGERY — CYSTOURETEROSCOPY, WITH RETROGRADE PYELOGRAM AND STENT INSERTION
Anesthesia: General | Site: Urethra | Laterality: Left | Wound class: Clean Contaminated

## 2016-10-28 MED ORDER — PHENYLEPHRINE HCL 10 MG/ML IJ SOLN
INTRAMUSCULAR | Status: DC | PRN
  Administered 2016-10-28: 100 ug via INTRAVENOUS

## 2016-10-28 MED ORDER — OXYCODONE HCL 5 MG/5ML PO SOLN: 5.0000 mg | Freq: Once | ORAL | Status: DC | PRN

## 2016-10-28 MED ORDER — OXYCODONE HCL 5 MG PO TABS: 5.0000 mg | ORAL_TABLET | Freq: Once | ORAL | Status: DC | PRN

## 2016-10-28 MED ORDER — FENTANYL CITRATE (PF) 100 MCG/2ML IJ SOLN: 25.0000 ug | INTRAMUSCULAR | Status: DC | PRN

## 2016-10-28 MED ORDER — SUCCINYLCHOLINE CHLORIDE 20 MG/ML IJ SOLN
INTRAMUSCULAR | Status: DC | PRN
  Administered 2016-10-28: 80 mg via INTRAVENOUS

## 2016-10-28 MED ORDER — IOPAMIDOL (ISOVUE-300) INJECTION 61%
100.0000 mL | Freq: Once | INTRAVENOUS | Status: AC | PRN
  Administered 2016-10-28: 100 mL via INTRAVENOUS

## 2016-10-28 MED ORDER — SODIUM CHLORIDE 0.9 % IR SOLN
Status: DC | PRN
  Administered 2016-10-28: 600 mL

## 2016-10-28 MED ORDER — SODIUM CHLORIDE 0.9 % IV SOLN
INTRAVENOUS | Status: DC
  Administered 2016-10-28: 22:00:00 via INTRAVENOUS

## 2016-10-28 MED ORDER — LIDOCAINE HCL (CARDIAC) 20 MG/ML IV SOLN
INTRAVENOUS | Status: DC | PRN
  Administered 2016-10-28: 80 mg via INTRAVENOUS

## 2016-10-28 MED ORDER — MIDAZOLAM HCL 2 MG/2ML IJ SOLN: INTRAMUSCULAR | Status: DC | PRN

## 2016-10-28 MED ORDER — SODIUM CHLORIDE 0.9 % IV SOLN
3.0000 g | INTRAVENOUS | Status: AC
  Administered 2016-10-28: 3 g via INTRAVENOUS
  Filled 2016-10-28: qty 3

## 2016-10-28 MED ORDER — PROPOFOL 10 MG/ML IV BOLUS
INTRAVENOUS | Status: AC
  Filled 2016-10-28: qty 20

## 2016-10-28 MED ORDER — MEPERIDINE HCL 50 MG/ML IJ SOLN: 6.2500 mg | INTRAMUSCULAR | Status: DC | PRN

## 2016-10-28 MED ORDER — PROPOFOL 10 MG/ML IV BOLUS
INTRAVENOUS | Status: DC | PRN
  Administered 2016-10-28: 130 mg via INTRAVENOUS

## 2016-10-28 MED ORDER — FENTANYL CITRATE (PF) 100 MCG/2ML IJ SOLN
INTRAMUSCULAR | Status: DC | PRN
  Administered 2016-10-28 (×4): 50 ug via INTRAVENOUS

## 2016-10-28 MED ORDER — GENTAMICIN SULFATE 40 MG/ML IJ SOLN
5.0000 mg/kg | Freq: Once | INTRAMUSCULAR | Status: AC
  Administered 2016-10-28: 390 mg via INTRAVENOUS
  Filled 2016-10-28: qty 9.75

## 2016-10-28 MED ORDER — LIDOCAINE HCL (PF) 2 % IJ SOLN
INTRAMUSCULAR | Status: AC
  Filled 2016-10-28: qty 2

## 2016-10-28 MED ORDER — FENTANYL CITRATE (PF) 100 MCG/2ML IJ SOLN
INTRAMUSCULAR | Status: AC
  Filled 2016-10-28: qty 2

## 2016-10-28 MED ORDER — CIPROFLOXACIN HCL 500 MG PO TABS: 500.0000 mg | ORAL_TABLET | Freq: Two times a day (BID) | ORAL | 0 refills | Status: DC

## 2016-10-28 MED ORDER — ONDANSETRON HCL 4 MG/2ML IJ SOLN
INTRAMUSCULAR | Status: DC | PRN
  Administered 2016-10-28: 4 mg via INTRAVENOUS

## 2016-10-28 MED ORDER — PROMETHAZINE HCL 25 MG/ML IJ SOLN: 6.2500 mg | INTRAMUSCULAR | Status: DC | PRN

## 2016-10-28 MED ORDER — SODIUM CHLORIDE 0.9 % IV BOLUS (SEPSIS)
1000.0000 mL | INTRAVENOUS | Status: AC
  Administered 2016-10-28: 1000 mL via INTRAVENOUS

## 2016-10-28 SURGICAL SUPPLY — 36 items
BACTOSHIELD CHG 4% 4OZ (MISCELLANEOUS) ×2
BAG DRAIN CYSTO-URO LG1000N (MISCELLANEOUS) ×3 IMPLANT
BAG URINE DRAIN UROCATCH STRL (MISCELLANEOUS) ×3 IMPLANT
BALLN NEPHROMAX 30X10X12 (MISCELLANEOUS)
BALLOON NEPHROMAX 30X10X12 (MISCELLANEOUS) IMPLANT
CATH FOL 2WAY LX 16X5 (CATHETERS) ×3 IMPLANT
CATH FOLEY 2W COUNCIL 5CC 18FR (CATHETERS) IMPLANT
CATH URETL 5X70 OPEN END (CATHETERS) ×3 IMPLANT
CNTNR SPEC 2.5X3XGRAD LEK (MISCELLANEOUS) ×1
CONRAY 43 FOR UROLOGY 50M (MISCELLANEOUS) ×3 IMPLANT
CONT SPEC 4OZ STER OR WHT (MISCELLANEOUS) ×2
CONTAINER SPEC 2.5X3XGRAD LEK (MISCELLANEOUS) ×1 IMPLANT
FEE TECHNICIAN ONLY PER HOUR (MISCELLANEOUS) IMPLANT
GLOVE BIO SURGEON STRL SZ7 (GLOVE) ×6 IMPLANT
GLOVE BIO SURGEON STRL SZ7.5 (GLOVE) ×6 IMPLANT
GOWN STRL REUS W/ TWL XL LVL3 (GOWN DISPOSABLE) ×2 IMPLANT
GOWN STRL REUS W/TWL XL LVL3 (GOWN DISPOSABLE) ×4
GUIDEWIRE SUPER STIFF .035X180 (WIRE) IMPLANT
INTRODUCER DILATOR DOUBLE (INTRODUCER) IMPLANT
KIT RM TURNOVER CYSTO AR (KITS) ×3 IMPLANT
LASER HOLMIUM FIBER SU 272UM (MISCELLANEOUS) IMPLANT
PACK CYSTO AR (MISCELLANEOUS) ×3 IMPLANT
SCRUB CHG 4% DYNA-HEX 4OZ (MISCELLANEOUS) ×1 IMPLANT
SENSORWIRE 0.038 NOT ANGLED (WIRE) ×3
SET CYSTO W/LG BORE CLAMP LF (SET/KITS/TRAYS/PACK) ×3 IMPLANT
SLEEVE SCD COMPRESS THIGH MED (MISCELLANEOUS) ×3 IMPLANT
SOL .9 NS 3000ML IRR  AL (IV SOLUTION) ×2
SOL .9 NS 3000ML IRR UROMATIC (IV SOLUTION) ×1 IMPLANT
STENT PERCUFLEX 4.8FRX24 (STENTS) ×3 IMPLANT
STENT URET 6FRX24 CONTOUR (STENTS) IMPLANT
STENT URET 6FRX26 CONTOUR (STENTS) IMPLANT
SURGILUBE 2OZ TUBE FLIPTOP (MISCELLANEOUS) ×3 IMPLANT
SYR 30ML LL (SYRINGE) ×3 IMPLANT
SYRINGE IRR TOOMEY STRL 70CC (SYRINGE) ×3 IMPLANT
WATER STERILE IRR 1000ML POUR (IV SOLUTION) ×3 IMPLANT
WIRE SENSOR 0.038 NOT ANGLED (WIRE) ×1 IMPLANT

## 2016-10-28 NOTE — Op Note (Addendum)
Date of procedure: 10/28/16  Preoperative diagnosis:  1. Left ureteral stone 2 2. UTI 3. Possible sepsis   Postoperative diagnosis:  1. Same   Procedure: 1. Cystoscopy 2. Attempted left retrograde pyelogram 3. Left ureteral stent placement 4.8 French by 24 cm  Surgeon: Baruch Gouty, MD  Anesthesia: General  Complications: None  Intraoperative findings: The patient's 2 known UVJ stones were impacted the UVJ. Attempt was made to drain the left upper tracts with an open-ended catheter followed by retrograde pyelogram to aid in stent placement. However, the 6 [place] open-ended catheter was unable to be advanced past the impacted stones. At this point, a 4.8 [place] by 24 cm left double-J ureteral stent was able to be advanced past the 2 distal UVJ stones. After correct placement, there is copious amounts of clear yellow urine draining via the stent from the left upper tracts.  EBL: None  Specimens: Urine for culture after left upper tract was appropriate draining  Drains: Left 4.8 French x 24 cm double-J ureteral stent, 16 French foley catheter  Disposition: Stable to the postanesthesia care unit  Indication for procedure: The patient is a 56 y.o. female with left UVJ stones 2, perinephric stranding, leukocytosis, UTI, and tachycardia presents for emergent left ureteral stent placement to decompress an infected upper left urinary tract.  After reviewing the management options for treatment, the patient elected to proceed with the above surgical procedure(s). We have discussed the potential benefits and risks of the procedure, side effects of the proposed treatment, the likelihood of the patient achieving the goals of the procedure, and any potential problems that might occur during the procedure or recuperation. Informed consent has been obtained.  Description of procedure: The patient was met in the preoperative area. All risks, benefits, and indications of the procedure were  described in great detail. The patient consented to the procedure. Preoperative antibiotics were given. The patient was taken to the operative theater. General anesthesia was induced per the anesthesia service. The patient was then placed in the dorsal lithotomy position and prepped and draped in the usual sterile fashion. A preoperative timeout was called.   A 21 French 30 cystoscope was inserted into the patient's bladder per urethra atraumatically. The sensor wire with some difficulty was able to advance the level of the left renal pelvis under fluoroscopy. An attempt was made to place a 6 [place] open-ended ureteral catheter over this sensor wire however the 2 distal left UVJ stones were impacted, and it was impossible to place this catheter passed him. At this point, decision was made to simply place a stent. Due to the fact a 6 French catheter was unable to place, a 4.8 [place] by 24 cm double-J ureteral stent was able to be advanced past the stone up to the level of the left renal pelvis. The sensor wire was removed. There is curl seen in the left renal pelvis on fluoroscopy and in the urinary bladder under fertilization. There was copious amounts of clear yellow urine draining through the left ureteral stent at this point indicating correct placement. The cystoscope was withdrawn. A 60 French Foley catheter was then placed to dependent drainage. The patient was woke from anesthesia and transferred in stable condition to the post anesthesia care unit.  Plan:  The patient will be admitted overnight to monitor for sepsis. Ig her vitals are stable overnight and her laboratory values normalize, we can remove her Foley catheter in the morning. She will need to continue antibiotics pending culture and  sensitivity results. She will need to follow-up for definitive stone management via left ureteroscopy once the stone clears as an outpatient in a few weeks.  Baruch Gouty, M.D.

## 2016-10-28 NOTE — Transfer of Care (Signed)
Immediate Anesthesia Transfer of Care Note  Patient: Tara Cochran  Procedure(s) Performed: Procedure(s): CYSTOSCOPY WITH attempted RETROGRADE PYELOGRAM, URETEROSCOPY AND STENT PLACEMENT left (Left)  Patient Location: PACU  Anesthesia Type:General  Level of Consciousness: awake and alert   Airway & Oxygen Therapy: Patient Spontanous Breathing and Patient connected to face mask oxygen  Post-op Assessment: Report given to RN and Post -op Vital signs reviewed and stable  Post vital signs: Reviewed and stable  Last Vitals: There were no vitals filed for this visit.  Last Pain:  Vitals:   10/28/16 1844  PainSc: 10-Worst pain ever         Complications: No apparent anesthesia complications

## 2016-10-28 NOTE — Anesthesia Postprocedure Evaluation (Signed)
Anesthesia Post Note  Patient: Khaleesi Gruel Stangl  Procedure(s) Performed: Procedure(s) (LRB): CYSTOSCOPY WITH attempted RETROGRADE PYELOGRAM, URETEROSCOPY AND STENT PLACEMENT left (Left)  Patient location during evaluation: PACU Anesthesia Type: General Level of consciousness: awake and alert and oriented Pain management: pain level controlled Vital Signs Assessment: post-procedure vital signs reviewed and stable Respiratory status: spontaneous breathing, nonlabored ventilation and respiratory function stable Cardiovascular status: blood pressure returned to baseline and stable Postop Assessment: no signs of nausea or vomiting Anesthetic complications: no     Last Vitals:  Vitals:   10/28/16 2335 10/28/16 2340  BP:    Pulse: 99 97  Resp: 17 (!) 32  Temp:      Last Pain:  Vitals:   10/28/16 2335  PainSc: 0-No pain                 Adler Alton

## 2016-10-28 NOTE — Anesthesia Preprocedure Evaluation (Signed)
Anesthesia Evaluation  Patient identified by MRN, date of birth, ID band Patient awake    Reviewed: Allergy & Precautions, NPO status , Patient's Chart, lab work & pertinent test results  History of Anesthesia Complications Negative for: history of anesthetic complications  Airway Mallampati: II  TM Distance: >3 FB Neck ROM: Full    Dental  (+) Implants, Poor Dentition   Pulmonary asthma , Current Smoker,    breath sounds clear to auscultation- rhonchi (-) wheezing      Cardiovascular hypertension, Pt. on medications (-) CAD and (-) Past MI  Rhythm:Regular Rate:Normal - Systolic murmurs and - Diastolic murmurs    Neuro/Psych PSYCHIATRIC DISORDERS Depression    GI/Hepatic negative GI ROS, (+) Hepatitis -, C  Endo/Other  diabetes, Oral Hypoglycemic Agents  Renal/GU Renal disease: nephrolithiasis.     Musculoskeletal negative musculoskeletal ROS (+)   Abdominal (+) + obese,   Peds  Hematology negative hematology ROS (+)   Anesthesia Other Findings Past Medical History: No date: Diabetes mellitus without complication (HCC) No date: Hypertension No date: Polycythemia   Reproductive/Obstetrics                             Anesthesia Physical Anesthesia Plan  ASA: III and emergent  Anesthesia Plan: General   Post-op Pain Management:    Induction: Intravenous, Rapid sequence and Cricoid pressure planned  Airway Management Planned: Oral ETT  Additional Equipment:   Intra-op Plan:   Post-operative Plan: Extubation in OR  Informed Consent: I have reviewed the patients History and Physical, chart, labs and discussed the procedure including the risks, benefits and alternatives for the proposed anesthesia with the patient or authorized representative who has indicated his/her understanding and acceptance.   Dental advisory given  Plan Discussed with: CRNA and  Anesthesiologist  Anesthesia Plan Comments:         Anesthesia Quick Evaluation

## 2016-10-28 NOTE — Progress Notes (Signed)
Name: Tara Cochran   MRN: [id number]    DOB: 07-08-1960   Date:10/28/2016       Progress Note  Subjective  Chief Complaint  Chief Complaint  Patient presents with  . Hospitalization Follow-up    UTI    HPI  Pt. Presents for hospital follow up, she was seen at [facility] for complaints of dysuria, increased frequency of urination and pain on the left lower abdomen radiating into the left groin. SHe was diagnosed with Cystitis and started on Macrobid, on day 3/7.  She feels about the same, pain is still there in the groin, having increased urinary frequency, having nausea and vomiting and bloating in abdomen. She has had an elevated temperature of 100F last night, having chills.    Past Medical History:  Diagnosis Date  . Diabetes mellitus without complication ([place])   . Hypertension   . Polycythemia     Past Surgical History:  Procedure Laterality Date  . CESAREAN SECTION    . CHOLECYSTECTOMY    . COLONOSCOPY N/A 12/12/2014   Procedure: COLONOSCOPY;  Surgeon: Lucilla Lame, MD;  Location: [place];  Service: Gastroenterology;  Laterality: N/A;  . ESOPHAGOGASTRODUODENOSCOPY N/A 12/12/2014   Procedure: ESOPHAGOGASTRODUODENOSCOPY (EGD);  Surgeon: Lucilla Lame, MD;  Location: [place];  Service: Gastroenterology;  Laterality: N/A;  . GALLBLADDER SURGERY    . POLYPECTOMY  12/12/2014   Procedure: POLYPECTOMY;  Surgeon: Lucilla Lame, MD;  Location: [place];  Service: Gastroenterology;;    Family History  Problem Relation Age of Onset  . Stroke Mother   . Hypertension Mother   . Hypertension Father   . Stroke Father   . Heart disease Sister     Social History   Social History  . Marital status: Divorced    Spouse name: N/A  . Number of children: N/A  . Years of education: N/A   Occupational History  . Not on file.   Social History Main Topics  . Smoking status: Current Every Day Smoker    Packs/day: 0.50    Types: Cigarettes  . Smokeless  tobacco: Never Used  . Alcohol use 0.0 - 0.6 oz/week  . Drug use: Yes    Frequency: 2.0 times per week    Types: Marijuana  . Sexual activity: Not on file   Other Topics Concern  . Not on file   Social History Narrative  . No narrative on file     Current Outpatient Prescriptions:  .  ACCU-CHEK SMARTVIEW test strip, as directed., Disp: , Rfl:  .  budesonide-formoterol (SYMBICORT) 160-4.5 MCG/ACT inhaler, Inhale 2 puffs into the lungs 2 (two) times daily., Disp: 1 Inhaler, Rfl: 0 .  buPROPion (WELLBUTRIN XL) 150 MG 24 hr tablet, Take 1 tablet (150 mg total) by mouth daily., Disp: 90 tablet, Rfl: 0 .  Eluxadoline (VIBERZI) 75 MG TABS, Take 75 mg by mouth 2 (two) times daily., Disp: 60 tablet, Rfl: 5 .  empagliflozin (JARDIANCE) 10 MG TABS tablet, Take 10 mg by mouth daily., Disp: 90 tablet, Rfl: 0 .  glipiZIDE (GLUCOTROL) 5 MG tablet, Take 1 tablet (5 mg total) by mouth daily before breakfast., Disp: 90 tablet, Rfl: 0 .  Incontinence Supply Disposable (BLADDER CONTROL PADS EX ABSORB) MISC, daily., Disp: , Rfl:  .  losartan (COZAAR) 100 MG tablet, TAKE 1 TABLET BY MOUTH DAILY, Disp: 90 tablet, Rfl: 0 .  methocarbamol (ROBAXIN) 500 MG tablet, Take 1 tablet (500 mg total) by mouth every 8 (eight) hours as  needed for muscle spasms., Disp: 90 tablet, Rfl: 0 .  Multiple Vitamins-Minerals (HAIR SKIN AND NAILS FORMULA) TABS, Take by mouth., Disp: , Rfl:  .  Multiple Vitamins-Minerals (WOMENS MULTIVITAMIN PLUS PO), daily., Disp: , Rfl:  .  nitrofurantoin, macrocrystal-monohydrate, (MACROBID) 100 MG capsule, Take 1 capsule (100 mg total) by mouth 2 (two) times daily., Disp: 14 capsule, Rfl: 0 .  pantoprazole (PROTONIX) 40 MG tablet, Take 1 tablet (40 mg total) by mouth daily before breakfast., Disp: 30 tablet, Rfl: 11 .  phenazopyridine (PYRIDIUM) 100 MG tablet, Take 1 tablet (100 mg total) by mouth 3 (three) times daily as needed for pain., Disp: 6 tablet, Rfl: 0 .  rosuvastatin (CRESTOR) 10 MG  tablet, Take 1 tablet (10 mg total) by mouth daily., Disp: 90 tablet, Rfl: 0 .  VENTOLIN HFA 108 (90 Base) MCG/ACT inhaler, INHALE 2 PUFFS BY MOUTH EVERY 6 HOURS ASNEEDED FOR WHEEZING OR SHORTNESS OF BREATH, Disp: 18 g, Rfl: 2  Allergies  Allergen Reactions  . Morphine Sulfate Itching     ROS  Please see history of present illness for complete discussion of ROS  Objective  Vitals:   10/28/16 1338  BP: (!) 148/75  Pulse: (!) 120  Resp: 17  Temp: 99.9 F (37.7 C)  TempSrc: Oral  SpO2: 94%  Weight: 174 lb 8 oz (79.2 kg)  Height: 4\' 10"  (1.473 m)    Physical Exam  Constitutional: She is well-developed, well-nourished, and in no distress.  Cardiovascular: Normal rate, regular rhythm and normal heart sounds.   No murmur heard. Pulmonary/Chest: Effort normal and breath sounds normal. She has no wheezes.  Abdominal: Bowel sounds are normal. There is tenderness in the left lower quadrant. There is CVA tenderness (left CVA tenderness to palpation).    Nursing note and vitals reviewed.       Assessment & Plan  1. Urinary frequency Point-of-care urinalysis shows moderate ketones, large blood and positive nitrites suggesting UTI - POCT Urinalysis Dipstick - CT ABDOMEN PELVIS W CONTRAST; Future  2. Acute left flank pain Suspect ascending infection from the bladder, obtain CT scan abdomen and pelvis with and without contrast, send urine for analysis, will start on ciprofloxacin 500 mg twice a day for 10 days for treatment. Adjust after review of lab results - CBC with Differential/Platelet - COMPLETE METABOLIC PANEL WITH GFR - Urinalysis, Routine w reflex microscopic - Urine Culture - ciprofloxacin (CIPRO) 500 MG tablet; Take 1 tablet (500 mg total) by mouth 2 (two) times daily.  Dispense: 20 tablet; Refill: 0 - CT ABDOMEN PELVIS W CONTRAST; Future  Teandra Harlan Asad A. [facility] 10/28/2016 1:56 PM

## 2016-10-28 NOTE — Anesthesia Procedure Notes (Signed)
Procedure Name: Intubation Performed by: Demetrius Charity Pre-anesthesia Checklist: Patient identified, Patient being monitored, Timeout performed, Emergency Drugs available and Suction available Patient Re-evaluated:Patient Re-evaluated prior to inductionOxygen Delivery Method: Circle system utilized Preoxygenation: Pre-oxygenation with 100% oxygen Intubation Type: IV induction, Rapid sequence and Cricoid Pressure applied Laryngoscope Size: Mac and 3 Grade View: Grade I Tube type: Oral Tube size: 7.0 mm Number of attempts: 1 Airway Equipment and Method: Stylet Placement Confirmation: ETT inserted through vocal cords under direct vision,  positive ETCO2 and breath sounds checked- equal and bilateral Secured at: 21 cm Tube secured with: Tape Dental Injury: Teeth and Oropharynx as per pre-operative assessment

## 2016-10-28 NOTE — ED Provider Notes (Signed)
[facility] Emergency Department Provider Note  ____________________________________________   First MD Initiated Contact with Patient 10/28/16 1920     (approximate)  I have reviewed the triage vital signs and the nursing notes.   HISTORY  Chief Complaint Flank Pain    HPI Tara Cochran is a 56 y.o. female who presents for evaluation of left flank pain that has been going on for about 5 days and because her primary care doctor told her she has an abnormal CT scan.  She was seen about 2 days ago in this emergency department and diagnosed with a urinary tract infection and started on Macrobid.  Her pain was not getting any better and her symptoms were continuing including subjective fever and chills and worsening left flank pain and she reports that she started to run a fever which measured 100 by mouth.  She called her primary care doctor, Dr. Manuella Ghazi, who ordered a CT scan of the abdomen and pelvis with IV contrast.  She had just gotten home this afternoon when Dr. Brigitte Pulse called her back and told her she should go immediately to the emergency department because he was called with the results of the CT scan which indicated she has multiple stones and hydronephrosis.  She has had some nausea but no vomiting.  She denies chest pain, shortness of breath, right-sided abdominal pain, headache, diarrhea.  She has not seen any blood in her urine.  Her flank pain is sharp and waxes and wanes from mild to severe.  Moving around makes it worse and rest makes it a little bit better.  Symptoms all started acutely about 5 days ago.  Past Medical History:  Diagnosis Date  . Diabetes mellitus without complication ([place])   . Hypertension   . Polycythemia     Patient Active Problem List   Diagnosis Date Noted  . Sepsis ([place]) 10/28/2016  . Hyperlipidemia 05/05/2016  . Elevated alkaline phosphatase level 11/04/2015  . Urinary urgency 08/07/2015  . Hypertension 08/07/2015    . Abnormal chest CT 05/26/2015  . Chronic radicular low back pain 02/18/2015  . Chronic bronchitis ([place]) 02/04/2015  . Dyslipidemia associated with type 2 diabetes mellitus ([place]) 02/04/2015  . Elevated hemoglobin ([place]) 12/19/2014  . Nausea   . Duodenitis   . Gastritis with hemorrhage   . Reflux esophagitis   . Loss of weight   . Rectal polyp   . H/O arthritis 12/02/2014  . Airway hyperreactivity 12/02/2014  . Backache 12/02/2014  . Barrett esophagus 12/02/2014  . CAFL (chronic airflow limitation) ([place]) 12/02/2014  . Depression 12/02/2014  . Controlled type 2 diabetes mellitus without complication, with long-term current use of insulin ([place]) 12/02/2014  . Chronic hepatitis C ([place]) 12/02/2014  . H/O: HTN (hypertension) 12/02/2014  . Diarrhea 12/02/2014  . Erythrocytosis 01/15/2013    Past Surgical History:  Procedure Laterality Date  . CESAREAN SECTION    . CHOLECYSTECTOMY    . COLONOSCOPY N/A 12/12/2014   Procedure: COLONOSCOPY;  Surgeon: Lucilla Lame, MD;  Location: [place];  Service: Gastroenterology;  Laterality: N/A;  . ESOPHAGOGASTRODUODENOSCOPY N/A 12/12/2014   Procedure: ESOPHAGOGASTRODUODENOSCOPY (EGD);  Surgeon: Lucilla Lame, MD;  Location: [place];  Service: Gastroenterology;  Laterality: N/A;  . GALLBLADDER SURGERY    . POLYPECTOMY  12/12/2014   Procedure: POLYPECTOMY;  Surgeon: Lucilla Lame, MD;  Location: [place];  Service: Gastroenterology;;    Prior to Admission medications   Medication Sig Start Date End Date Taking? Authorizing  Provider  budesonide-formoterol (SYMBICORT) 160-4.5 MCG/ACT inhaler Inhale 2 puffs into the lungs 2 (two) times daily. 08/06/16  Yes Roselee Nova, MD  buPROPion (WELLBUTRIN XL) 150 MG 24 hr tablet Take 1 tablet (150 mg total) by mouth daily. 08/06/16  Yes Roselee Nova, MD  ciprofloxacin (CIPRO) 500 MG tablet Take 1 tablet (500 mg total) by mouth 2 (two) times daily. 10/28/16 11/07/16 Yes Syed Richmond Campbell, MD  Eluxadoline (VIBERZI) 75 MG TABS Take 75 mg by mouth 2 (two) times daily. 05/04/16  Yes Lucilla Lame, MD  empagliflozin (JARDIANCE) 10 MG TABS tablet Take 10 mg by mouth daily. 08/06/16  Yes Roselee Nova, MD  glipiZIDE (GLUCOTROL) 5 MG tablet Take 1 tablet (5 mg total) by mouth daily before breakfast. 08/06/16  Yes Roselee Nova, MD  losartan (COZAAR) 100 MG tablet TAKE 1 TABLET BY MOUTH DAILY 10/26/16  Yes Roselee Nova, MD  methocarbamol (ROBAXIN) 500 MG tablet Take 1 tablet (500 mg total) by mouth every 8 (eight) hours as needed for muscle spasms. 05/05/16  Yes Roselee Nova, MD  Multiple Vitamins-Minerals (WOMENS MULTIVITAMIN PLUS PO) daily. 08/27/14  Yes Historical Provider, MD  nitrofurantoin, macrocrystal-monohydrate, (MACROBID) 100 MG capsule Take 1 capsule (100 mg total) by mouth 2 (two) times daily. 10/26/16 11/02/16 Yes Laban Emperor, PA-C  pantoprazole (PROTONIX) 40 MG tablet Take 1 tablet (40 mg total) by mouth daily before breakfast. 12/12/14  Yes Lucilla Lame, MD  phenazopyridine (PYRIDIUM) 100 MG tablet Take 1 tablet (100 mg total) by mouth 3 (three) times daily as needed for pain. 10/26/16 10/28/16 Yes Laban Emperor, PA-C  rosuvastatin (CRESTOR) 10 MG tablet Take 1 tablet (10 mg total) by mouth daily. 08/06/16  Yes Roselee Nova, MD  VENTOLIN HFA 108 (90 Base) MCG/ACT inhaler INHALE 2 PUFFS BY MOUTH EVERY 6 HOURS ASNEEDED FOR WHEEZING OR SHORTNESS OF BREATH 07/01/16  Yes Roselee Nova, MD  ACCU-CHEK SMARTVIEW test strip as directed. 11/06/14   Historical Provider, MD  Incontinence Supply Disposable (BLADDER CONTROL PADS EX ABSORB) MISC daily. 08/27/14   Historical Provider, MD    Allergies Morphine sulfate  Family History  Problem Relation Age of Onset  . Stroke Mother   . Hypertension Mother   . Hypertension Father   . Stroke Father   . Heart disease Sister     Social History Social History  Substance Use Topics  . Smoking status: Current Every Day Smoker     Packs/day: 0.50    Types: Cigarettes  . Smokeless tobacco: Never Used  . Alcohol use 0.0 - 0.6 oz/week    Review of Systems Constitutional: Subjective fever/chills (Tmax 100) Eyes: No visual changes. ENT: No sore throat. Cardiovascular: Denies chest pain. Respiratory: Denies shortness of breath. Gastrointestinal: Left flank pain with nausea, no vomiting, no diarrhea Genitourinary: Negative for dysuria nor hematuria Musculoskeletal: Left flank pain Integumentary: Negative for rash. Neurological: Negative for headaches, focal weakness or numbness.   ____________________________________________   PHYSICAL EXAM:  VITAL SIGNS: ED Triage Vitals [10/28/16 1844]  Enc Vitals Group     BP      Pulse      Resp      Temp      Temp src      SpO2      Weight 174 lb (78.9 kg)     Height 4\' 10"  (1.473 m)     Head Circumference  Peak Flow      Pain Score 10     Pain Loc      Pain Edu?      Excl. in [place]?     Constitutional: Alert and oriented. Well appearing and in no acute distress.  Occasionally appears uncomfortable as a wave of pain hits her Eyes: Conjunctivae are normal. PERRL. EOMI. Head: Atraumatic. Nose: No congestion/rhinnorhea. Mouth/Throat: Mucous membranes are moist. Neck: No stridor.  No meningeal signs.   Cardiovascular: Borderline tachycardia, regular rhythm. Good peripheral circulation. Grossly normal heart sounds. Respiratory: Normal respiratory effort.  No retractions. Lungs CTAB. Gastrointestinal: Obese.  Soft and nontender. No distention.  Musculoskeletal: No lower extremity tenderness nor edema. No gross deformities of extremities.  Left sided CVA tenderness Neurologic:  Normal speech and language. No gross focal neurologic deficits are appreciated.  Skin:  Skin is warm, dry and intact. No rash noted. Psychiatric: Mood and affect are normal. Speech and behavior are normal.  ____________________________________________   LABS (all labs ordered are  listed, but only abnormal results are displayed)  Labs Reviewed  URINALYSIS, COMPLETE (UACMP) WITH MICROSCOPIC - Abnormal; Notable for the following:       Result Value   Color, Urine AMBER (*)    APPearance CLOUDY (*)    Specific Gravity, Urine >1.046 (*)    Glucose, UA 150 (*)    Hgb urine dipstick MODERATE (*)    Ketones, ur 5 (*)    Protein, ur 30 (*)    Nitrite POSITIVE (*)    Leukocytes, UA LARGE (*)    Squamous Epithelial / LPF 0-5 (*)    All other components within normal limits  BASIC METABOLIC PANEL - Abnormal; Notable for the following:    Sodium 131 (*)    Chloride 98 (*)    Glucose, Bld 231 (*)    Creatinine, Ser 1.01 (*)    All other components within normal limits  CBC - Abnormal; Notable for the following:    WBC 13.8 (*)    All other components within normal limits  HEPATIC FUNCTION PANEL - Abnormal; Notable for the following:    Albumin 3.3 (*)    Alkaline Phosphatase 130 (*)    All other components within normal limits  LIPASE, BLOOD - Abnormal; Notable for the following:    Lipase 142 (*)    All other components within normal limits  URINE CULTURE   ____________________________________________  EKG  ED ECG REPORT I, Nylene Inlow, the attending physician, personally viewed and interpreted this ECG.  Date: 10/28/2016 EKG Time: 20:06 Rate: 89 Rhythm: normal sinus rhythm QRS Axis: normal Intervals: normal ST/T Wave abnormalities: normal Conduction Disturbances: none Narrative Interpretation: unremarkable  ____________________________________________  RADIOLOGY   Ct Abdomen Pelvis W Contrast  Result Date: 10/28/2016 CLINICAL DATA:  Left-sided abdominal/flank pain EXAM: CT ABDOMEN AND PELVIS WITH CONTRAST TECHNIQUE: Multidetector CT imaging of the abdomen and pelvis was performed using the standard protocol following bolus administration of intravenous contrast. Oral contrast was also administered. CONTRAST:  154mL ISOVUE-300 IOPAMIDOL  (ISOVUE-300) INJECTION 61% COMPARISON:  April 23, 2014 FINDINGS: Lower chest: There is scarring in the left lower lung zone. There are foci of coronary artery calcification. Hepatobiliary: The liver is again noted to have a subtly nodular contour, concerning for underlying hepatic cirrhosis. No focal liver lesions are apparent. Gallbladder is absent. There is no appreciable biliary duct dilatation. Pancreas: No pancreatic mass or inflammatory focus. Pancreatic duct is borderline prominent stable compared to the 2015 study. Spleen:  No splenic lesions are evident. Adrenals/Urinary Tract: Adrenals appear unremarkable bilaterally. There is no renal mass on either side. Left kidney is subtly edematous with left-sided perinephric stranding. There is no hydronephrosis the right. There is moderate hydronephrosis on the left. There is no intrarenal calculus on either side. On the left, there are 2 immediately adjacent calculi just proximal to the left ureterovesical junction. In this area, there is a calculus measuring 5 x 4 mm within immediately adjacent 4 x 4 mm calculus. No other ureteral calculi are identified on either side. Urinary bladder is midline with wall thickness within normal limits. Stomach/Bowel: There are scattered left-sided colonic diverticula without diverticulitis. There is no bowel wall or mesenteric thickening. No bowel obstruction. No free air or portal venous air. Vascular/Lymphatic: There are foci of atherosclerotic calcification in the aorta and iliac arteries. No evident abdominal aortic aneurysm. Subcentimeter retroperitoneal lymph nodes are stable compared to the prior study from 2015. There is no frank adenopathy by size criteria in the abdomen or pelvis. Reproductive: Uterus is anteverted. There is no appreciable pelvic mass. Other: Appendix appears normal. There is no ascites or abscess in the abdomen or pelvis. There is a rather minimal ventral hernia containing only fat. Musculoskeletal:  There is a hemangioma in the L4 vertebral body. No blastic or lytic bone lesions are evident. There is no intramuscular or abdominal wall lesion. IMPRESSION: There are two adjacent distal ureteral calculi on the left with moderate hydronephrosis in ureterectasis on the left. The distal ureteral calculi on the left measure 5 x 4 mm and 4 x 4 mm respectively. The left kidney shows subtle edema with perinephric stranding. The contour of the liver raises question of underlying cirrhosis. No focal liver lesions are evident. Gallbladder absent. Appendix appears normal.  No abscess.  No bowel obstruction. Rather minimal ventral hernia containing fat. There is aortoiliac atherosclerosis. There are foci of coronary artery calcification. There is a stable hemangioma in the L4 vertebral body, a benign finding. These results will be called to the ordering clinician or representative by the Radiologist Assistant, and communication documented in the PACS or zVision Dashboard. Electronically Signed   By: Lowella Grip III M.D.   On: 10/28/2016 17:06    ____________________________________________   PROCEDURES  Critical Care performed: No   Procedure(s) performed:   Procedures   ____________________________________________   INITIAL IMPRESSION / ASSESSMENT AND PLAN / ED COURSE  Pertinent labs & imaging results that were available during my care of the patient were reviewed by me and considered in my medical decision making (see chart for details).  The patient does not appear septic although she almost meet criteria based on heart rate and very mild leukocytosis.  However she is generally well-appearing, ambulatory, and in no acute distress.  However she has some concerning findings on her CT scan and lab work, most notably a nitrite positive urinary tract infection with perinephric stranding and 2 sizable stones in her left ureter.  The presence of pyelonephritis, subjective fever and chills,  leukocytosis, and tachycardia suggests the need for urological intervention with a stent.  I called and spoke by phone with Dr. Pilar Jarvis and we discussed the case.  He agreed that she would benefit from a stent.  I was going to give her ceftriaxone but he recommended ampicillin and gentamicin as his preferred treatment of choice, so I have ordered those antibiotics.  I made her NPO and ordered a liter of fluids.  He requested that, since she appears borderline  septic, I contact the hospitalist for admission so I have paged them (8:02 PM).  I updated the patient and she agrees with the plan.      ____________________________________________  FINAL CLINICAL IMPRESSION(S) / ED DIAGNOSES  Final diagnoses:  Pyelonephritis  Ureterolithiasis     MEDICATIONS GIVEN DURING THIS VISIT:  Medications  Ampicillin-Sulbactam (UNASYN) 3 g in sodium chloride 0.9 % 100 mL IVPB (3 g Intravenous New Bag/Given 10/28/16 2049)  gentamicin (GARAMYCIN) 390 mg in dextrose 5 % 50 mL IVPB (not administered)  sodium chloride 0.9 % bolus 1,000 mL (1,000 mLs Intravenous New Bag/Given 10/28/16 1957)     NEW OUTPATIENT MEDICATIONS STARTED DURING THIS VISIT:  New Prescriptions   No medications on file    Modified Medications   No medications on file    Discontinued Medications   MULTIPLE VITAMINS-MINERALS (HAIR SKIN AND NAILS FORMULA) TABS    Take by mouth.     Note:  This document was prepared using Dragon voice recognition software and may include unintentional dictation errors.    Hinda Kehr, MD 10/28/16 2108

## 2016-10-28 NOTE — ED Notes (Signed)
meds infusing  Pt talking on cell phone.

## 2016-10-28 NOTE — ED Notes (Signed)
Called pharmacy to request antibiotics

## 2016-10-28 NOTE — ED Notes (Signed)
Patient transported to OR.

## 2016-10-28 NOTE — H&P (Signed)
[name]    MR#:  [id number]  DATE OF BIRTH:  1960-07-11  DATE OF ADMISSION:  10/28/2016  PRIMARY CARE PHYSICIAN: Keith Rake, MD   REQUESTING/REFERRING PHYSICIAN: Hinda Kehr, MD  CHIEF COMPLAINT:   Chief Complaint  Patient presents with  . Flank Pain   Fever, chills, dysuria and flank pain for 5 days. HISTORY OF PRESENT ILLNESS:  Tara Cochran  is a 56 y.o. female with a known history of UTI, Hypertension, diabetes, polycythemia. The patient presented to the ED with above chief complaint. She has had fever, chills, dysuria, urine frequency and urgency, as well as flank pain for the past 5 days. She went to PCPs office and was found UTI and multiples stones and hydronephrosis. She was sent to the ED by PCP. She also complains of left lower abdominal pain radiation to [place] area. She has tachycardia and leukocytosis. ED physician discussed with on-call urologist, who suggested started Unasyn and gentamicin, he will put stent tonight.  PAST MEDICAL HISTORY:   Past Medical History:  Diagnosis Date  . Diabetes mellitus without complication ([place])   . Hypertension   . Polycythemia     PAST SURGICAL HISTORY:   Past Surgical History:  Procedure Laterality Date  . CESAREAN SECTION    . CHOLECYSTECTOMY    . COLONOSCOPY N/A 12/12/2014   Procedure: COLONOSCOPY;  Surgeon: Lucilla Lame, MD;  Location: [place];  Service: Gastroenterology;  Laterality: N/A;  . ESOPHAGOGASTRODUODENOSCOPY N/A 12/12/2014   Procedure: ESOPHAGOGASTRODUODENOSCOPY (EGD);  Surgeon: Lucilla Lame, MD;  Location: [place];  Service: Gastroenterology;  Laterality: N/A;  . GALLBLADDER SURGERY    . POLYPECTOMY  12/12/2014   Procedure: POLYPECTOMY;  Surgeon: Lucilla Lame, MD;  Location: [place];  Service: Gastroenterology;;    SOCIAL HISTORY:   Social History  Substance Use Topics  . Smoking status:  Current Every Day Smoker    Packs/day: 0.50    Types: Cigarettes  . Smokeless tobacco: Never Used  . Alcohol use 0.0 - 0.6 oz/week    FAMILY HISTORY:   Family History  Problem Relation Age of Onset  . Stroke Mother   . Hypertension Mother   . Hypertension Father   . Stroke Father   . Heart disease Sister     DRUG ALLERGIES:   Allergies  Allergen Reactions  . Morphine Sulfate Itching    REVIEW OF SYSTEMS:   Review of Systems  Constitutional: Positive for chills, fever and malaise/fatigue.  HENT: Negative for congestion and sore throat.   Eyes: Negative for blurred vision and double vision.  Respiratory: Negative for cough, shortness of breath and stridor.   Cardiovascular: Negative for chest pain, palpitations and leg swelling.  Gastrointestinal: Positive for abdominal pain, nausea and vomiting. Negative for blood in stool, constipation, diarrhea and melena.  Genitourinary: Positive for dysuria, flank pain, frequency and urgency. Negative for hematuria.  Musculoskeletal: Negative for back pain.  Skin: Negative for itching and rash.  Neurological: Negative for dizziness, focal weakness, loss of consciousness, weakness and headaches.  Psychiatric/Behavioral: Negative for depression. The patient is not nervous/anxious.     MEDICATIONS AT HOME:   Prior to Admission medications   Medication Sig Start Date End Date Taking? Authorizing Provider  ACCU-CHEK SMARTVIEW test strip as directed. 11/06/14   Historical Provider, MD  budesonide-formoterol (SYMBICORT) 160-4.5 MCG/ACT inhaler Inhale 2 puffs into the lungs 2 (two) times daily. 08/06/16   Okey Dupre  Anthonette Legato, MD  buPROPion (WELLBUTRIN XL) 150 MG 24 hr tablet Take 1 tablet (150 mg total) by mouth daily. 08/06/16   Roselee Nova, MD  ciprofloxacin (CIPRO) 500 MG tablet Take 1 tablet (500 mg total) by mouth 2 (two) times daily. 10/28/16 11/07/16  Roselee Nova, MD  Eluxadoline (VIBERZI) 75 MG TABS Take 75 mg by mouth 2 (two) times  daily. 05/04/16   Lucilla Lame, MD  empagliflozin (JARDIANCE) 10 MG TABS tablet Take 10 mg by mouth daily. 08/06/16   Roselee Nova, MD  glipiZIDE (GLUCOTROL) 5 MG tablet Take 1 tablet (5 mg total) by mouth daily before breakfast. 08/06/16   Roselee Nova, MD  Incontinence Supply Disposable (BLADDER CONTROL PADS EX ABSORB) MISC daily. 08/27/14   Historical Provider, MD  losartan (COZAAR) 100 MG tablet TAKE 1 TABLET BY MOUTH DAILY 10/26/16   Roselee Nova, MD  methocarbamol (ROBAXIN) 500 MG tablet Take 1 tablet (500 mg total) by mouth every 8 (eight) hours as needed for muscle spasms. 05/05/16   Roselee Nova, MD  Multiple Vitamins-Minerals (HAIR SKIN AND NAILS FORMULA) TABS Take by mouth.    Historical Provider, MD  Multiple Vitamins-Minerals (WOMENS MULTIVITAMIN PLUS PO) daily. 08/27/14   Historical Provider, MD  nitrofurantoin, macrocrystal-monohydrate, (MACROBID) 100 MG capsule Take 1 capsule (100 mg total) by mouth 2 (two) times daily. 10/26/16 11/02/16  Laban Emperor, PA-C  pantoprazole (PROTONIX) 40 MG tablet Take 1 tablet (40 mg total) by mouth daily before breakfast. 12/12/14   Lucilla Lame, MD  phenazopyridine (PYRIDIUM) 100 MG tablet Take 1 tablet (100 mg total) by mouth 3 (three) times daily as needed for pain. 10/26/16 10/28/16  Laban Emperor, PA-C  rosuvastatin (CRESTOR) 10 MG tablet Take 1 tablet (10 mg total) by mouth daily. 08/06/16   Roselee Nova, MD  VENTOLIN HFA 108 [phone] Base) MCG/ACT inhaler INHALE 2 PUFFS BY MOUTH EVERY 6 HOURS ASNEEDED FOR WHEEZING OR SHORTNESS OF BREATH 07/01/16   Roselee Nova, MD      VITAL SIGNS:  Height 4\' 10"  (1.473 m), weight 174 lb (78.9 kg).  PHYSICAL EXAMINATION:  Physical Exam  GENERAL:  56 y.o.-year-old patient lying in the bed with no acute distress. Obese. EYES: Pupils equal, round, reactive to light and accommodation. No scleral icterus. Extraocular muscles intact.  HEENT: Head atraumatic, normocephalic. Oropharynx and nasopharynx clear.  NECK:   Supple, no jugular venous distention. No thyroid enlargement, no tenderness.  LUNGS: Normal breath sounds bilaterally, no wheezing, rales,rhonchi or crepitation. No use of accessory muscles of respiration.  CARDIOVASCULAR: S1, S2 normal. No murmurs, rubs, or gallops.  ABDOMEN: Soft, nontender, nondistended. Bowel sounds present. No organomegaly or mass. No CVA tenderness this time. EXTREMITIES: No pedal edema, cyanosis, or clubbing.  NEUROLOGIC: Cranial nerves II through XII are intact. Muscle strength 5/5 in all extremities. Sensation intact. Gait not checked.  PSYCHIATRIC: The patient is alert and oriented x 3.  SKIN: No obvious rash, lesion, or ulcer.   LABORATORY PANEL:   CBC  Recent Labs Lab 10/28/16 1845  WBC 13.8*  HGB 14.1  HCT 41.0  PLT 254   ------------------------------------------------------------------------------------------------------------------  Chemistries   Recent Labs Lab 10/28/16 1845  NA 131*  K 4.4  CL 98*  CO2 25  GLUCOSE 231*  BUN 17  CREATININE 1.01*  CALCIUM 9.1  AST 30  ALT 25  ALKPHOS 130*  BILITOT 1.0   ------------------------------------------------------------------------------------------------------------------  Cardiac Enzymes No  results for input(s): TROPONINI in the last 168 hours. ------------------------------------------------------------------------------------------------------------------  RADIOLOGY:  Ct Abdomen Pelvis W Contrast  Result Date: 10/28/2016 CLINICAL DATA:  Left-sided abdominal/flank pain EXAM: CT ABDOMEN AND PELVIS WITH CONTRAST TECHNIQUE: Multidetector CT imaging of the abdomen and pelvis was performed using the standard protocol following bolus administration of intravenous contrast. Oral contrast was also administered. CONTRAST:  168mL ISOVUE-300 IOPAMIDOL (ISOVUE-300) INJECTION 61% COMPARISON:  April 23, 2014 FINDINGS: Lower chest: There is scarring in the left lower lung zone. There are foci of  coronary artery calcification. Hepatobiliary: The liver is again noted to have a subtly nodular contour, concerning for underlying hepatic cirrhosis. No focal liver lesions are apparent. Gallbladder is absent. There is no appreciable biliary duct dilatation. Pancreas: No pancreatic mass or inflammatory focus. Pancreatic duct is borderline prominent stable compared to the 2015 study. Spleen:  No splenic lesions are evident. Adrenals/Urinary Tract: Adrenals appear unremarkable bilaterally. There is no renal mass on either side. Left kidney is subtly edematous with left-sided perinephric stranding. There is no hydronephrosis the right. There is moderate hydronephrosis on the left. There is no intrarenal calculus on either side. On the left, there are 2 immediately adjacent calculi just proximal to the left ureterovesical junction. In this area, there is a calculus measuring 5 x 4 mm within immediately adjacent 4 x 4 mm calculus. No other ureteral calculi are identified on either side. Urinary bladder is midline with wall thickness within normal limits. Stomach/Bowel: There are scattered left-sided colonic diverticula without diverticulitis. There is no bowel wall or mesenteric thickening. No bowel obstruction. No free air or portal venous air. Vascular/Lymphatic: There are foci of atherosclerotic calcification in the aorta and iliac arteries. No evident abdominal aortic aneurysm. Subcentimeter retroperitoneal lymph nodes are stable compared to the prior study from 2015. There is no frank adenopathy by size criteria in the abdomen or pelvis. Reproductive: Uterus is anteverted. There is no appreciable pelvic mass. Other: Appendix appears normal. There is no ascites or abscess in the abdomen or pelvis. There is a rather minimal ventral hernia containing only fat. Musculoskeletal: There is a hemangioma in the L4 vertebral body. No blastic or lytic bone lesions are evident. There is no intramuscular or abdominal wall  lesion. IMPRESSION: There are two adjacent distal ureteral calculi on the left with moderate hydronephrosis in ureterectasis on the left. The distal ureteral calculi on the left measure 5 x 4 mm and 4 x 4 mm respectively. The left kidney shows subtle edema with perinephric stranding. The contour of the liver raises question of underlying cirrhosis. No focal liver lesions are evident. Gallbladder absent. Appendix appears normal.  No abscess.  No bowel obstruction. Rather minimal ventral hernia containing fat. There is aortoiliac atherosclerosis. There are foci of coronary artery calcification. There is a stable hemangioma in the L4 vertebral body, a benign finding. These results will be called to the ordering clinician or representative by the Radiologist Assistant, and communication documented in the PACS or zVision Dashboard. Electronically Signed   By: Lowella Grip III M.D.   On: 10/28/2016 17:06      IMPRESSION AND PLAN:   Sepsis due to UTI The patient will be admitted to medical floor. Start Unasyn and gentamicin per urologist. Follow-up CBC, blood culture and urine culture.  Nephrolithiasis with hydronephrosis. Follow-up the urologist for stent placement tonight. Keep nothing by mouth except medication with IV fluid support.  Hyponatremia. Start normal saline IV and follow-up BMP. Diabetes. Hold by mouth diabetes medication, start a  sliding scale. Check hemoglobin A1c. Hypertension. Continue home hypertension medication. Tobacco abuse. Smoking cessation was counseled for 4 minutes, the patient doesn't want nicotine patch.  All the records are reviewed and case discussed with ED provider. Management plans discussed with the patient, family and they are in agreement.  CODE STATUS: Full code  TOTAL TIME TAKING CARE OF THIS PATIENT: 58 minutes.    Demetrios Loll M.D on 10/28/2016 at 8:31 PM  Between 7am to 6pm - Pager - [phone]  After 6pm go to [email or web address] - [place]  [facility]  Office  [phone]  CC: Primary care physician; Keith Rake, MD   Note: This dictation was prepared with Dragon dictation along with smaller phrase technology. Any transcriptional errors that result from this process are unintentional.

## 2016-10-28 NOTE — ED Triage Notes (Signed)
Pt was sent by PCP after an abnormal CT scan, states Sunday she developed flank pain with nausea, denies any blood in her urine

## 2016-10-28 NOTE — ED Notes (Signed)
Admitting Provider at bedside. 

## 2016-10-28 NOTE — Anesthesia Post-op Follow-up Note (Cosign Needed)
Anesthesia QCDR form completed.        

## 2016-10-28 NOTE — Consult Note (Addendum)
10:08 PM   Tara Cochran 01/26/1961 [id number]  Referring provider: Dr. Gwen Pounds  CC: Left ureteral stone/UTI  HPI: The patient is a 56 year old diabetic female with no previous genitourinary history who presented to the hospital after being diagnosed with 2 distal left UVJ stones and a UTI for emergent evaluation. Currently, she complains of left flank pain, nausea, and vomiting. She also has had subjective fevers and chills. She has no history of nephrolithiasis. The CT scan ordered by her primary care provider showed 2 distal left calculi one 5 mm and the other 4 mm adjacent to each other in the left UVJ with subsequent hydroureteronephrosis and perinephric stranding. Her white blood cell count is 13.8. She was tachycardic in the emergency department. Her urinalysis was concerning for urinary tract infection.  PMH: Past Medical History:  Diagnosis Date  . Diabetes mellitus without complication (Tara Cochran)   . Hypertension   . Polycythemia     Surgical History: Past Surgical History:  Procedure Laterality Date  . CESAREAN SECTION    . CHOLECYSTECTOMY    . COLONOSCOPY N/A 12/12/2014   Procedure: COLONOSCOPY;  Surgeon: Lucilla Lame, MD;  Location: [place];  Service: Gastroenterology;  Laterality: N/A;  . ESOPHAGOGASTRODUODENOSCOPY N/A 12/12/2014   Procedure: ESOPHAGOGASTRODUODENOSCOPY (EGD);  Surgeon: Lucilla Lame, MD;  Location: [place];  Service: Gastroenterology;  Laterality: N/A;  . GALLBLADDER SURGERY    . POLYPECTOMY  12/12/2014   Procedure: POLYPECTOMY;  Surgeon: Lucilla Lame, MD;  Location: [place];  Service: Gastroenterology;;     Allergies:  Allergies  Allergen Reactions  . Morphine Sulfate Itching    Family History: Family History  Problem Relation Age of Onset  . Stroke Mother   . Hypertension Mother   . Hypertension Father   . Stroke Father   . Heart disease Sister     Social History:  reports that she has been  smoking Cigarettes.  She has been smoking about 0.50 packs per day. She has never used smokeless tobacco. She reports that she drinks alcohol. She reports that she uses drugs, including Marijuana, about 2 times per week.  ROS: 12 point ROS otherwise negative                                        Physical Exam: Ht 4\' 10"  (1.473 m)   Wt 174 lb (78.9 kg)   BMI 36.37 kg/m   Constitutional:  Alert and oriented, No acute distress. HEENT: [place] AT, moist mucus membranes.  Trachea midline, no masses. Cardiovascular: No clubbing, cyanosis, or edema. Respiratory: Normal respiratory effort, no increased work of breathing. GI: Abdomen is soft, nontender, nondistended, no abdominal masses GU: Left CVA tenderness.  Skin: No rashes, bruises or suspicious lesions. Lymph: No cervical or inguinal adenopathy. Neurologic: Grossly intact, no focal deficits, moving all 4 extremities. Psychiatric: Normal mood and affect.  Laboratory Data: Lab Results  Component Value Date   WBC 13.8 (H) 10/28/2016   HGB 14.1 10/28/2016   HCT 41.0 10/28/2016   MCV 89.2 10/28/2016   PLT 254 10/28/2016    Lab Results  Component Value Date   CREATININE 1.01 (H) 10/28/2016    No results found for: PSA  No results found for: TESTOSTERONE  Lab Results  Component Value Date   HGBA1C 7.2 08/06/2016    Urinalysis    Component Value Date/Time   COLORURINE AMBER (  A) 10/28/2016 1846   APPEARANCEUR CLOUDY (A) 10/28/2016 1846   APPEARANCEUR Cloudy (A) 08/11/2015 1219   LABSPEC >1.046 (H) 10/28/2016 1846   LABSPEC 1.046 04/23/2014 2102   PHURINE 5.0 10/28/2016 1846   GLUCOSEU 150 (A) 10/28/2016 1846   GLUCOSEU Negative 04/23/2014 2102   HGBUR MODERATE (A) 10/28/2016 1846   BILIRUBINUR NEGATIVE 10/28/2016 1846   BILIRUBINUR MODERATE 10/28/2016 1342   BILIRUBINUR Negative 08/11/2015 1219   BILIRUBINUR Negative 04/23/2014 2102   KETONESUR 5 (A) 10/28/2016 1846   PROTEINUR 30 (A) 10/28/2016  1846   UROBILINOGEN 1.0 10/28/2016 1342   NITRITE POSITIVE (A) 10/28/2016 1846   LEUKOCYTESUR LARGE (A) 10/28/2016 1846   LEUKOCYTESUR 2+ (A) 08/11/2015 1219   LEUKOCYTESUR Negative 04/23/2014 2102    Pertinent Imaging: CT reviewed as above  Assessment & Plan:    1. Left UVJ stones x2 2. Left perinephric stranding 3. Leukocytosis 4. UTI 5. Possible sepsis I discussed with the patient that she has two distal left ureteral calculi with an underlying urinary tract infection and concern for impending sepsis. We discussed the need for emergent left ureteral stent placement via cystoscopy, left retrograde pyelogram, and left ureteral stent placement. She understands that this procedure is to unobstruct her left collecting system. She understands we will address her stone burden at a later date when her infection has cleared. We discussed the risks and benefits of this procedure. We also discussed the meed for postoperative urethral catheter for optimal drainage of her left upper tract. We'll plan at this time for emergent cystoscopy, left retrograde pyelogram, and left ureteral stent placement. All questions were answered. The patient is agreeable to proceeding.  Postoperatively, she will be admitted to the floor to treat her sepsis. She will need to continue antibiotics pending culture and sensitivity results. We'll plan for definitive stone management in a few weeks once her infection has resolved.  [name], MD  [facility] [address] [place], [place] [address] [phone]

## 2016-10-29 ENCOUNTER — Telehealth: Payer: Self-pay | Admitting: Urology

## 2016-10-29 DIAGNOSIS — E871 Hypo-osmolality and hyponatremia: Secondary | ICD-10-CM | POA: Diagnosis not present

## 2016-10-29 DIAGNOSIS — N1330 Unspecified hydronephrosis: Secondary | ICD-10-CM | POA: Diagnosis not present

## 2016-10-29 DIAGNOSIS — E785 Hyperlipidemia, unspecified: Secondary | ICD-10-CM | POA: Diagnosis not present

## 2016-10-29 DIAGNOSIS — D751 Secondary polycythemia: Secondary | ICD-10-CM | POA: Diagnosis not present

## 2016-10-29 DIAGNOSIS — N201 Calculus of ureter: Secondary | ICD-10-CM | POA: Diagnosis not present

## 2016-10-29 DIAGNOSIS — E119 Type 2 diabetes mellitus without complications: Secondary | ICD-10-CM | POA: Diagnosis not present

## 2016-10-29 DIAGNOSIS — N200 Calculus of kidney: Secondary | ICD-10-CM | POA: Diagnosis not present

## 2016-10-29 DIAGNOSIS — A419 Sepsis, unspecified organism: Secondary | ICD-10-CM | POA: Diagnosis not present

## 2016-10-29 DIAGNOSIS — K210 Gastro-esophageal reflux disease with esophagitis: Secondary | ICD-10-CM | POA: Diagnosis not present

## 2016-10-29 DIAGNOSIS — I10 Essential (primary) hypertension: Secondary | ICD-10-CM | POA: Diagnosis not present

## 2016-10-29 DIAGNOSIS — N179 Acute kidney failure, unspecified: Secondary | ICD-10-CM | POA: Diagnosis not present

## 2016-10-29 DIAGNOSIS — N136 Pyonephrosis: Secondary | ICD-10-CM | POA: Diagnosis not present

## 2016-10-29 LAB — URINALYSIS, ROUTINE W REFLEX MICROSCOPIC
NITRITE: POSITIVE — AB
SPECIFIC GRAVITY, URINE: 1.025 (ref 1.001–1.035)
pH: 6 (ref 5.0–8.0)

## 2016-10-29 LAB — BASIC METABOLIC PANEL
ANION GAP: 6 (ref 5–15)
BUN: 19 mg/dL (ref 6–20)
CALCIUM: 8.2 mg/dL — AB (ref 8.9–10.3)
CO2: 21 mmol/L — AB (ref 22–32)
CREATININE: 0.83 mg/dL (ref 0.44–1.00)
Chloride: 107 mmol/L (ref 101–111)
GLUCOSE: 270 mg/dL — AB (ref 65–99)
Potassium: 4.2 mmol/L (ref 3.5–5.1)
Sodium: 134 mmol/L — ABNORMAL LOW (ref 135–145)

## 2016-10-29 LAB — CBC
HCT: 35.3 % (ref 35.0–47.0)
Hemoglobin: 12.4 g/dL (ref 12.0–16.0)
MCH: 31 pg (ref 26.0–34.0)
MCHC: 35.1 g/dL (ref 32.0–36.0)
MCV: 88.4 fL (ref 80.0–100.0)
PLATELETS: 200 10*3/uL (ref 150–440)
RBC: 4 MIL/uL (ref 3.80–5.20)
RDW: 12.8 % (ref 11.5–14.5)
WBC: 12.4 10*3/uL — ABNORMAL HIGH (ref 3.6–11.0)

## 2016-10-29 LAB — URINALYSIS, MICROSCOPIC ONLY
CASTS: NONE SEEN [LPF]
CRYSTALS: NONE SEEN [HPF]
Yeast: NONE SEEN [HPF]

## 2016-10-29 LAB — LACTIC ACID, PLASMA
LACTIC ACID, VENOUS: 1 mmol/L (ref 0.5–1.9)
Lactic Acid, Venous: 1.8 mmol/L (ref 0.5–1.9)

## 2016-10-29 LAB — GLUCOSE, CAPILLARY
GLUCOSE-CAPILLARY: 191 mg/dL — AB (ref 65–99)
Glucose-Capillary: 261 mg/dL — ABNORMAL HIGH (ref 65–99)

## 2016-10-29 LAB — PROTIME-INR
INR: 1.2
Prothrombin Time: 15.3 seconds — ABNORMAL HIGH (ref 11.4–15.2)

## 2016-10-29 LAB — APTT: aPTT: 42 seconds — ABNORMAL HIGH (ref 24–36)

## 2016-10-29 LAB — PROCALCITONIN: PROCALCITONIN: 0.24 ng/mL

## 2016-10-29 LAB — GENTAMICIN LEVEL, RANDOM: Gentamicin Rm: 4.6 ug/mL

## 2016-10-29 MED ORDER — CEFUROXIME AXETIL 500 MG PO TABS: 500.0000 mg | ORAL_TABLET | Freq: Two times a day (BID) | ORAL | 0 refills | Status: AC

## 2016-10-29 MED ORDER — KETOROLAC TROMETHAMINE 30 MG/ML IJ SOLN: 30.0000 mg | Freq: Four times a day (QID) | INTRAMUSCULAR | Status: DC | PRN

## 2016-10-29 MED ORDER — ONDANSETRON HCL 4 MG/2ML IJ SOLN
4.0000 mg | Freq: Four times a day (QID) | INTRAMUSCULAR | Status: DC | PRN
Start: 2016-10-29 — End: 2016-10-29

## 2016-10-29 MED ORDER — ACETAMINOPHEN 325 MG PO TABS
650.0000 mg | ORAL_TABLET | Freq: Four times a day (QID) | ORAL | Status: DC | PRN
Start: 2016-10-29 — End: 2016-10-29

## 2016-10-29 MED ORDER — ALBUTEROL SULFATE (2.5 MG/3ML) 0.083% IN NEBU: 2.5000 mg | INHALATION_SOLUTION | RESPIRATORY_TRACT | Status: DC | PRN

## 2016-10-29 MED ORDER — INSULIN ASPART 100 UNIT/ML ~~LOC~~ SOLN: 0.0000 [IU] | Freq: Three times a day (TID) | SUBCUTANEOUS | Status: DC

## 2016-10-29 MED ORDER — TRAMADOL HCL 50 MG PO TABS: 50.0000 mg | ORAL_TABLET | Freq: Four times a day (QID) | ORAL | Status: DC | PRN

## 2016-10-29 MED ORDER — KETOROLAC TROMETHAMINE 30 MG/ML IJ SOLN
15.0000 mg | Freq: Four times a day (QID) | INTRAMUSCULAR | Status: DC | PRN
  Administered 2016-10-29: 02:00:00 15 mg via INTRAVENOUS
  Filled 2016-10-29: qty 1

## 2016-10-29 MED ORDER — PANTOPRAZOLE SODIUM 40 MG PO TBEC: 40.0000 mg | DELAYED_RELEASE_TABLET | Freq: Every day | ORAL | Status: DC

## 2016-10-29 MED ORDER — ELUXADOLINE 75 MG PO TABS: 75.0000 mg | ORAL_TABLET | Freq: Two times a day (BID) | ORAL | Status: DC

## 2016-10-29 MED ORDER — ENOXAPARIN SODIUM 40 MG/0.4ML ~~LOC~~ SOLN: 40.0000 mg | SUBCUTANEOUS | Status: DC

## 2016-10-29 MED ORDER — SODIUM CHLORIDE 0.9 % IV SOLN
3.0000 g | Freq: Four times a day (QID) | INTRAVENOUS | Status: DC
  Administered 2016-10-29: 02:00:00 3 g via INTRAVENOUS
  Filled 2016-10-29 (×4): qty 3

## 2016-10-29 MED ORDER — INSULIN ASPART 100 UNIT/ML ~~LOC~~ SOLN: 0.0000 [IU] | Freq: Every day | SUBCUTANEOUS | Status: DC

## 2016-10-29 MED ORDER — ROSUVASTATIN CALCIUM 5 MG PO TABS: 10.0000 mg | ORAL_TABLET | Freq: Every day | ORAL | Status: DC

## 2016-10-29 MED ORDER — ACETAMINOPHEN 650 MG RE SUPP: 650.0000 mg | Freq: Four times a day (QID) | RECTAL | Status: DC | PRN

## 2016-10-29 MED ORDER — SODIUM CHLORIDE 0.9 % IV SOLN
INTRAVENOUS | Status: DC
  Administered 2016-10-29: via INTRAVENOUS

## 2016-10-29 MED ORDER — METHOCARBAMOL 500 MG PO TABS
500.0000 mg | ORAL_TABLET | Freq: Three times a day (TID) | ORAL | Status: DC | PRN
  Filled 2016-10-29: qty 1

## 2016-10-29 MED ORDER — GENTAMICIN SULFATE 40 MG/ML IJ SOLN
390.0000 mg | INTRAVENOUS | Status: DC
  Filled 2016-10-29: qty 9.75

## 2016-10-29 MED ORDER — ONDANSETRON HCL 4 MG PO TABS: 4.0000 mg | ORAL_TABLET | Freq: Four times a day (QID) | ORAL | Status: DC | PRN

## 2016-10-29 MED ORDER — SENNOSIDES-DOCUSATE SODIUM 8.6-50 MG PO TABS: 1.0000 | ORAL_TABLET | Freq: Every evening | ORAL | Status: DC | PRN

## 2016-10-29 MED ORDER — LOSARTAN POTASSIUM 50 MG PO TABS: 100.0000 mg | ORAL_TABLET | Freq: Every day | ORAL | Status: DC

## 2016-10-29 MED ORDER — BUPROPION HCL ER (XL) 150 MG PO TB24
150.0000 mg | ORAL_TABLET | Freq: Every day | ORAL | Status: DC
  Filled 2016-10-29: qty 1

## 2016-10-29 MED ORDER — MOMETASONE FURO-FORMOTEROL FUM 200-5 MCG/ACT IN AERO
2.0000 | INHALATION_SPRAY | Freq: Two times a day (BID) | RESPIRATORY_TRACT | Status: DC
  Administered 2016-10-29: 2 via RESPIRATORY_TRACT
  Filled 2016-10-29: qty 8.8

## 2016-10-29 NOTE — Progress Notes (Addendum)
Pharmacy Antibiotic Note  Tara Cochran is a 56 y.o. female admitted on 10/28/2016 with sepsis and UTI.  Pharmacy has been consulted for Unasyn and gentamicin dosing.  Plan: Unasyn 3 grams q 6 hours ordered.  Gentamicin DW 56kg  390 mg q 24 hours ordered. Random level 8 hours after first dose for comparison with nomogram.  5/4 AM gentamicin level within 24 hour range in nomogram. Continue current regimen.  Height: 4\' 10"  (147.3 cm) Weight: 174 lb (78.9 kg) IBW/kg (Calculated) : 40.9  Temp (24hrs), Avg:99.5 F (37.5 C), Min:98.3 F (36.8 C), Max:100.1 F (37.8 C)   Recent Labs Lab 10/25/16 1857 10/28/16 1433 10/28/16 1845  WBC 14.8* 13.3* 13.8*  CREATININE 0.67 0.87 1.01*    Estimated Creatinine Clearance: 56.4 mL/min (A) (by C-G formula based on SCr of 1.01 mg/dL (H)).    Allergies  Allergen Reactions  . Morphine Sulfate Itching    Antimicrobials this admission: unasyn gentamicin 5/3 >>    >>   Dose adjustments this admission:    Microbiology results: 5/4 BCx: pending 5/3 UCx: pending       5/3 UA: LE(+) NO2(+) WBC TNTC  Thank you for allowing pharmacy to be a part of this patient's care.  Janari Yamada S 10/29/2016 12:21 AM

## 2016-10-29 NOTE — Telephone Encounter (Signed)
Patient was discharged today.  The discharge nurse called to let us know that the patient needs to be contacted to set up surgery.  I advised them that you would contact the patient at home.

## 2016-10-29 NOTE — Telephone Encounter (Signed)
-----   Message from [name], MD sent at 10/29/2016  7:59 AM EDT ----- Patient stented for stone/UTI. Patient will need cysto, left urs, laser litho, left stent exchange in a few weeks once infection clears. Will need a repeat urine culture in prior. Will sign paperwork when in office later. thanks

## 2016-10-29 NOTE — Plan of Care (Signed)
Problem: Fluid Volume: Goal: Ability to maintain a balanced intake and output will improve Remains npo at this time.  Adequate urine output from foley this shift.

## 2016-10-29 NOTE — Progress Notes (Signed)
Patient dramatically improved since stent No f/c/n/v Pain resolved Feels 100% better  Vitals:   10/28/16 2340 10/28/16 2355 10/29/16 0522 10/29/16 0753  BP: (!) 106/57 (!) 96/54 (!) 92/54 (!) 103/56  Pulse: 97 92 72 79  Resp: (!) 32  (!) 22   Temp:  99.5 F (37.5 C) 97.6 F (36.4 C)   TempSrc:  Oral Oral   SpO2: 100% 99% 99%   Weight:  185 lb 1.6 oz (84 kg)    Height:  4\' 10"  (1.473 m)     I/O last 3 completed shifts: In: 1405.4 [P.O.:120; I.V.:1185.4; IV Piggyback:100] Out: 352 [Urine:350; Blood:2] No intake/output data recorded.   NAD Soft nt nd Foley clear  CBC    Component Value Date/Time   WBC 12.4 (H) 10/29/2016 0419   RBC 4.00 10/29/2016 0419   HGB 12.4 10/29/2016 0419   HGB 15.2 05/10/2014 1351   HCT 35.3 10/29/2016 0419   HCT 45.1 12/02/2014 1555   PLT 200 10/29/2016 0419   PLT 321 12/02/2014 1555   MCV 88.4 10/29/2016 0419   MCV 90 12/02/2014 1555   MCV 88 05/10/2014 1351   MCH 31.0 10/29/2016 0419   MCHC 35.1 10/29/2016 0419   RDW 12.8 10/29/2016 0419   RDW 13.8 12/02/2014 1555   RDW 13.3 05/10/2014 1351   LYMPHSABS 665 (L) 10/28/2016 1433   LYMPHSABS 2.4 12/02/2014 1555   LYMPHSABS 2.7 05/10/2014 1351   MONOABS 1,463 (H) 10/28/2016 1433   MONOABS 0.8 05/10/2014 1351   EOSABS 0 (L) 10/28/2016 1433   EOSABS 0.3 12/02/2014 1555   EOSABS 0.2 05/10/2014 1351   BASOSABS 0 10/28/2016 1433   BASOSABS 0.0 12/02/2014 1555   BASOSABS 0.0 05/10/2014 1351    POD 1 Left ureteral stent for UVJ stone x2 with UTI -doing much better -d/c foley -continue abx for UTI -no further urologic intervention during this admission -will arrange definitive stone surgery as outpatient once infection clears

## 2016-10-29 NOTE — Discharge Summary (Signed)
[facility] [place] NAME: Tara Cochran    MR#:  [id number]  DATE OF BIRTH:  04-22-1961  DATE OF ADMISSION:  10/28/2016 ADMITTING PHYSICIAN: Demetrios Loll, MD  DATE OF DISCHARGE: 10/29/2016  PRIMARY CARE PHYSICIAN: Keith Rake, MD    ADMISSION DIAGNOSIS:  Ureterolithiasis [N20.1] Pyelonephritis [N12]  DISCHARGE DIAGNOSIS:  Active Problems:   Sepsis ([place])   SECONDARY DIAGNOSIS:   Past Medical History:  Diagnosis Date  . Diabetes mellitus without complication ([place])   . Hypertension   . Polycythemia     HOSPITAL COURSE:   56 year old female with diabetes and essential hypertension who presented with left flank pain and found to have 2 distal left UVJ stones and urinary tract infection.   1. Sepsis due to urinary tract infection: Sepsis is resolved 2. Left UVJ stones x 2/hydronephrosis: Patient was seen and evaluated by urology. Recommendations are for left ureteral stent placement via cystoscopy. She underwent left ureteral stent placement on day of admission. She is doing quite well. Pain is controlled. Patient will need stent removed at some point. Patient will have follow-up with urology.  3 Urinary tract infection with past urinary tract infections of Escherichia coli Patient is discharged on Ceftin.  4. Hyponatremia: This is improved with IV fluids  5. Acute kidney injury: This is improved with IV fluids and stent placement  6. Diabetes: Patient will continue ADA diet and resume home medications  7. Essential hypertension: Patient's blood pressure was low due to IV pain medications She should be able to resume her home medications  8. Tobacco dependence: Patient is encouraged to quit smoking. Counseling was provided for 4 minutes.    DISCHARGE CONDITIONS AND DIET:   Stable Diabetic diet  CONSULTS OBTAINED:  Treatment Team:  [name], MD  DRUG ALLERGIES:   Allergies  Allergen Reactions  . Morphine  Sulfate Itching    DISCHARGE MEDICATIONS:   Current Discharge Medication List    START taking these medications   Details  cefUROXime (CEFTIN) 500 MG tablet Take 1 tablet (500 mg total) by mouth 2 (two) times daily with a meal. Qty: 22 tablet, Refills: 0      CONTINUE these medications which have NOT CHANGED   Details  budesonide-formoterol (SYMBICORT) 160-4.5 MCG/ACT inhaler Inhale 2 puffs into the lungs 2 (two) times daily. Qty: 1 Inhaler, Refills: 0   Associated Diagnoses: Chronic bronchitis, unspecified chronic bronchitis type (HCC)    buPROPion (WELLBUTRIN XL) 150 MG 24 hr tablet Take 1 tablet (150 mg total) by mouth daily. Qty: 90 tablet, Refills: 0   Associated Diagnoses: Recurrent major depressive disorder, in partial remission (HCC)    Eluxadoline (VIBERZI) 75 MG TABS Take 75 mg by mouth 2 (two) times daily. Qty: 60 tablet, Refills: 5   Associated Diagnoses: Irritable bowel syndrome with diarrhea    empagliflozin (JARDIANCE) 10 MG TABS tablet Take 10 mg by mouth daily. Qty: 90 tablet, Refills: 0   Associated Diagnoses: Controlled type 2 diabetes mellitus without complication, with long-term current use of insulin (HCC)    glipiZIDE (GLUCOTROL) 5 MG tablet Take 1 tablet (5 mg total) by mouth daily before breakfast. Qty: 90 tablet, Refills: 0   Associated Diagnoses: Controlled type 2 diabetes mellitus without complication, with long-term current use of insulin (HCC)    losartan (COZAAR) 100 MG tablet TAKE 1 TABLET BY MOUTH DAILY Qty: 90 tablet, Refills: 0   Associated Diagnoses: Essential hypertension    methocarbamol (ROBAXIN) 500 MG  tablet Take 1 tablet (500 mg total) by mouth every 8 (eight) hours as needed for muscle spasms. Qty: 90 tablet, Refills: 0   Associated Diagnoses: Chronic radicular low back pain    Multiple Vitamins-Minerals (WOMENS MULTIVITAMIN PLUS PO) daily.    nitrofurantoin, macrocrystal-monohydrate, (MACROBID) 100 MG capsule Take 1 capsule (100  mg total) by mouth 2 (two) times daily. Qty: 14 capsule, Refills: 0    pantoprazole (PROTONIX) 40 MG tablet Take 1 tablet (40 mg total) by mouth daily before breakfast. Qty: 30 tablet, Refills: 11   Associated Diagnoses: Gastritis    rosuvastatin (CRESTOR) 10 MG tablet Take 1 tablet (10 mg total) by mouth daily. Qty: 90 tablet, Refills: 0   Associated Diagnoses: Dyslipidemia associated with type 2 diabetes mellitus (HCC)    VENTOLIN HFA 108 (90 Base) MCG/ACT inhaler INHALE 2 PUFFS BY MOUTH EVERY 6 HOURS ASNEEDED FOR WHEEZING OR SHORTNESS OF BREATH Qty: 18 g, Refills: 2   Associated Diagnoses: Chronic bronchitis, unspecified chronic bronchitis type (HCC)    ACCU-CHEK SMARTVIEW test strip as directed.    Incontinence Supply Disposable (BLADDER CONTROL PADS EX ABSORB) MISC daily.      STOP taking these medications     ciprofloxacin (CIPRO) 500 MG tablet      phenazopyridine (PYRIDIUM) 100 MG tablet           Today   CHIEF COMPLAINT:  Doing well ready for discharge    VITAL SIGNS:  Blood pressure (!) 103/56, pulse 79, temperature 97.6 F (36.4 C), temperature source Oral, resp. rate (!) 22, height 4\' 10"  (1.473 m), weight 84 kg (185 lb 1.6 oz), SpO2 99 %.   REVIEW OF SYSTEMS:  Review of Systems  Constitutional: Negative.  Negative for chills, fever and malaise/fatigue.  HENT: Negative.  Negative for ear discharge, ear pain, hearing loss, nosebleeds and sore throat.   Eyes: Negative.  Negative for blurred vision and pain.  Respiratory: Negative.  Negative for cough, hemoptysis, shortness of breath and wheezing.   Cardiovascular: Negative.  Negative for chest pain, palpitations and leg swelling.  Gastrointestinal: Negative.  Negative for abdominal pain, blood in stool, diarrhea, nausea and vomiting.  Genitourinary: Negative.  Negative for dysuria.  Musculoskeletal: Negative.  Negative for back pain.  Skin: Negative.   Neurological: Negative for dizziness, tremors,  speech change, focal weakness, seizures and headaches.  Endo/Heme/Allergies: Negative.  Does not bruise/bleed easily.  Psychiatric/Behavioral: Negative.  Negative for depression, hallucinations and suicidal ideas.     PHYSICAL EXAMINATION:  GENERAL:  56 y.o.-year-old patient lying in the bed with no acute distress.  NECK:  Supple, no jugular venous distention. No thyroid enlargement, no tenderness.  LUNGS: Normal breath sounds bilaterally, no wheezing, rales,rhonchi  No use of accessory muscles of respiration.  CARDIOVASCULAR: S1, S2 normal. No murmurs, rubs, or gallops.  ABDOMEN: Soft, non-tender, non-distended. Bowel sounds present. No organomegaly or mass.  EXTREMITIES: No pedal edema, cyanosis, or clubbing.  PSYCHIATRIC: The patient is alert and oriented x 3.  SKIN: No obvious rash, lesion, or ulcer.   DATA REVIEW:   CBC  Recent Labs Lab 10/29/16 0419  WBC 12.4*  HGB 12.4  HCT 35.3  PLT 200    Chemistries   Recent Labs Lab 10/28/16 1845 10/29/16 0419  NA 131* 134*  K 4.4 4.2  CL 98* 107  CO2 25 21*  GLUCOSE 231* 270*  BUN 17 19  CREATININE 1.01* 0.83  CALCIUM 9.1 8.2*  AST 30  --   ALT 25  --  ALKPHOS 130*  --   BILITOT 1.0  --     Cardiac Enzymes No results for input(s): TROPONINI in the last 168 hours.  Microbiology Results  @[email or web address] @  RADIOLOGY:  Ct Abdomen Pelvis W Contrast  Result Date: 10/28/2016 CLINICAL DATA:  Left-sided abdominal/flank pain EXAM: CT ABDOMEN AND PELVIS WITH CONTRAST TECHNIQUE: Multidetector CT imaging of the abdomen and pelvis was performed using the standard protocol following bolus administration of intravenous contrast. Oral contrast was also administered. CONTRAST:  156mL ISOVUE-300 IOPAMIDOL (ISOVUE-300) INJECTION 61% COMPARISON:  April 23, 2014 FINDINGS: Lower chest: There is scarring in the left lower lung zone. There are foci of coronary artery calcification. Hepatobiliary: The liver is again noted to have a subtly  nodular contour, concerning for underlying hepatic cirrhosis. No focal liver lesions are apparent. Gallbladder is absent. There is no appreciable biliary duct dilatation. Pancreas: No pancreatic mass or inflammatory focus. Pancreatic duct is borderline prominent stable compared to the 2015 study. Spleen:  No splenic lesions are evident. Adrenals/Urinary Tract: Adrenals appear unremarkable bilaterally. There is no renal mass on either side. Left kidney is subtly edematous with left-sided perinephric stranding. There is no hydronephrosis the right. There is moderate hydronephrosis on the left. There is no intrarenal calculus on either side. On the left, there are 2 immediately adjacent calculi just proximal to the left ureterovesical junction. In this area, there is a calculus measuring 5 x 4 mm within immediately adjacent 4 x 4 mm calculus. No other ureteral calculi are identified on either side. Urinary bladder is midline with wall thickness within normal limits. Stomach/Bowel: There are scattered left-sided colonic diverticula without diverticulitis. There is no bowel wall or mesenteric thickening. No bowel obstruction. No free air or portal venous air. Vascular/Lymphatic: There are foci of atherosclerotic calcification in the aorta and iliac arteries. No evident abdominal aortic aneurysm. Subcentimeter retroperitoneal lymph nodes are stable compared to the prior study from 2015. There is no frank adenopathy by size criteria in the abdomen or pelvis. Reproductive: Uterus is anteverted. There is no appreciable pelvic mass. Other: Appendix appears normal. There is no ascites or abscess in the abdomen or pelvis. There is a rather minimal ventral hernia containing only fat. Musculoskeletal: There is a hemangioma in the L4 vertebral body. No blastic or lytic bone lesions are evident. There is no intramuscular or abdominal wall lesion. IMPRESSION: There are two adjacent distal ureteral calculi on the left with moderate  hydronephrosis in ureterectasis on the left. The distal ureteral calculi on the left measure 5 x 4 mm and 4 x 4 mm respectively. The left kidney shows subtle edema with perinephric stranding. The contour of the liver raises question of underlying cirrhosis. No focal liver lesions are evident. Gallbladder absent. Appendix appears normal.  No abscess.  No bowel obstruction. Rather minimal ventral hernia containing fat. There is aortoiliac atherosclerosis. There are foci of coronary artery calcification. There is a stable hemangioma in the L4 vertebral body, a benign finding. These results will be called to the ordering clinician or representative by the Radiologist Assistant, and communication documented in the PACS or zVision Dashboard. Electronically Signed   By: Lowella Grip III M.D.   On: 10/28/2016 17:06      Current Discharge Medication List    START taking these medications   Details  cefUROXime (CEFTIN) 500 MG tablet Take 1 tablet (500 mg total) by mouth 2 (two) times daily with a meal. Qty: 22 tablet, Refills: 0      CONTINUE these  medications which have NOT CHANGED   Details  budesonide-formoterol (SYMBICORT) 160-4.5 MCG/ACT inhaler Inhale 2 puffs into the lungs 2 (two) times daily. Qty: 1 Inhaler, Refills: 0   Associated Diagnoses: Chronic bronchitis, unspecified chronic bronchitis type (HCC)    buPROPion (WELLBUTRIN XL) 150 MG 24 hr tablet Take 1 tablet (150 mg total) by mouth daily. Qty: 90 tablet, Refills: 0   Associated Diagnoses: Recurrent major depressive disorder, in partial remission (HCC)    Eluxadoline (VIBERZI) 75 MG TABS Take 75 mg by mouth 2 (two) times daily. Qty: 60 tablet, Refills: 5   Associated Diagnoses: Irritable bowel syndrome with diarrhea    empagliflozin (JARDIANCE) 10 MG TABS tablet Take 10 mg by mouth daily. Qty: 90 tablet, Refills: 0   Associated Diagnoses: Controlled type 2 diabetes mellitus without complication, with long-term current use of  insulin (HCC)    glipiZIDE (GLUCOTROL) 5 MG tablet Take 1 tablet (5 mg total) by mouth daily before breakfast. Qty: 90 tablet, Refills: 0   Associated Diagnoses: Controlled type 2 diabetes mellitus without complication, with long-term current use of insulin (HCC)    losartan (COZAAR) 100 MG tablet TAKE 1 TABLET BY MOUTH DAILY Qty: 90 tablet, Refills: 0   Associated Diagnoses: Essential hypertension    methocarbamol (ROBAXIN) 500 MG tablet Take 1 tablet (500 mg total) by mouth every 8 (eight) hours as needed for muscle spasms. Qty: 90 tablet, Refills: 0   Associated Diagnoses: Chronic radicular low back pain    Multiple Vitamins-Minerals (WOMENS MULTIVITAMIN PLUS PO) daily.    nitrofurantoin, macrocrystal-monohydrate, (MACROBID) 100 MG capsule Take 1 capsule (100 mg total) by mouth 2 (two) times daily. Qty: 14 capsule, Refills: 0    pantoprazole (PROTONIX) 40 MG tablet Take 1 tablet (40 mg total) by mouth daily before breakfast. Qty: 30 tablet, Refills: 11   Associated Diagnoses: Gastritis    rosuvastatin (CRESTOR) 10 MG tablet Take 1 tablet (10 mg total) by mouth daily. Qty: 90 tablet, Refills: 0   Associated Diagnoses: Dyslipidemia associated with type 2 diabetes mellitus (HCC)    VENTOLIN HFA 108 (90 Base) MCG/ACT inhaler INHALE 2 PUFFS BY MOUTH EVERY 6 HOURS ASNEEDED FOR WHEEZING OR SHORTNESS OF BREATH Qty: 18 g, Refills: 2   Associated Diagnoses: Chronic bronchitis, unspecified chronic bronchitis type (HCC)    ACCU-CHEK SMARTVIEW test strip as directed.    Incontinence Supply Disposable (BLADDER CONTROL PADS EX ABSORB) MISC daily.      STOP taking these medications     ciprofloxacin (CIPRO) 500 MG tablet      phenazopyridine (PYRIDIUM) 100 MG tablet           Management plans discussed with the patient and she is in agreement. Stable for discharge home  Patient should follow up with urology  CODE STATUS:     Code Status Orders        Start     Ordered    10/29/16 0002  Full code  Continuous     10/29/16 0001    Code Status History    Date Active Date Inactive Code Status Order ID Comments User Context   This patient has a current code status but no historical code status.      TOTAL TIME TAKING CARE OF THIS PATIENT: 37 minutes.    Note: This dictation was prepared with Dragon dictation along with smaller phrase technology. Any transcriptional errors that result from this process are unintentional.  Yaelis Scharfenberg M.D on 10/29/2016 at 7:54 AM  Between 7am  to 6pm - Pager - [phone] After 6pm go to [email or web address] - password [email or web address] [facility]  Office  [phone]  CC: Primary care physician; Keith Rake, MD

## 2016-10-30 ENCOUNTER — Emergency Department: Payer: Medicare HMO

## 2016-10-30 ENCOUNTER — Encounter: Payer: Self-pay | Admitting: Emergency Medicine

## 2016-10-30 DIAGNOSIS — R509 Fever, unspecified: Secondary | ICD-10-CM | POA: Diagnosis not present

## 2016-10-30 DIAGNOSIS — Z79899 Other long term (current) drug therapy: Secondary | ICD-10-CM | POA: Insufficient documentation

## 2016-10-30 DIAGNOSIS — F17210 Nicotine dependence, cigarettes, uncomplicated: Secondary | ICD-10-CM | POA: Diagnosis not present

## 2016-10-30 DIAGNOSIS — E119 Type 2 diabetes mellitus without complications: Secondary | ICD-10-CM | POA: Insufficient documentation

## 2016-10-30 DIAGNOSIS — R1013 Epigastric pain: Secondary | ICD-10-CM | POA: Diagnosis not present

## 2016-10-30 DIAGNOSIS — R062 Wheezing: Secondary | ICD-10-CM | POA: Insufficient documentation

## 2016-10-30 DIAGNOSIS — R05 Cough: Secondary | ICD-10-CM | POA: Diagnosis present

## 2016-10-30 DIAGNOSIS — I10 Essential (primary) hypertension: Secondary | ICD-10-CM | POA: Insufficient documentation

## 2016-10-30 DIAGNOSIS — Z794 Long term (current) use of insulin: Secondary | ICD-10-CM | POA: Insufficient documentation

## 2016-10-30 DIAGNOSIS — R109 Unspecified abdominal pain: Secondary | ICD-10-CM | POA: Diagnosis not present

## 2016-10-30 LAB — COMPREHENSIVE METABOLIC PANEL
ALBUMIN: 3.2 g/dL — AB (ref 3.5–5.0)
ALK PHOS: 107 U/L (ref 38–126)
ALT: 45 U/L (ref 14–54)
AST: 39 U/L (ref 15–41)
Anion gap: 10 (ref 5–15)
BILIRUBIN TOTAL: 0.8 mg/dL (ref 0.3–1.2)
BUN: 17 mg/dL (ref 6–20)
CALCIUM: 8.9 mg/dL (ref 8.9–10.3)
CO2: 22 mmol/L (ref 22–32)
CREATININE: 0.52 mg/dL (ref 0.44–1.00)
Chloride: 105 mmol/L (ref 101–111)
GFR calc Af Amer: >60 mL/min (ref >60)
GLUCOSE: 168 mg/dL — AB (ref 65–99)
Potassium: 3.4 mmol/L — ABNORMAL LOW (ref 3.5–5.1)
Sodium: 137 mmol/L (ref 135–145)
TOTAL PROTEIN: 7 g/dL (ref 6.5–8.1)

## 2016-10-30 LAB — CBC WITH DIFFERENTIAL/PLATELET
BASOS ABS: 0 10*3/uL (ref 0–0.1)
BASOS PCT: 0 %
Eosinophils Absolute: 0.1 10*3/uL (ref 0–0.7)
Eosinophils Relative: 1 %
HEMATOCRIT: 40.4 % (ref 35.0–47.0)
Hemoglobin: 13.9 g/dL (ref 12.0–16.0)
Lymphocytes Relative: 17 %
Lymphs Abs: 1.8 10*3/uL (ref 1.0–3.6)
MCH: 31 pg (ref 26.0–34.0)
MCHC: 34.5 g/dL (ref 32.0–36.0)
MCV: 89.9 fL (ref 80.0–100.0)
MONO ABS: 1.4 10*3/uL — AB (ref 0.2–0.9)
MONOS PCT: 14 %
NEUTROS ABS: 7.1 10*3/uL — AB (ref 1.4–6.5)
Neutrophils Relative %: 68 %
PLATELETS: 280 10*3/uL (ref 150–440)
RBC: 4.49 MIL/uL (ref 3.80–5.20)
RDW: 12.9 % (ref 11.5–14.5)
WBC: 10.5 10*3/uL (ref 3.6–11.0)

## 2016-10-30 LAB — URINE CULTURE
Culture: <10000 — AB
Special Requests: NORMAL

## 2016-10-30 LAB — HIV ANTIBODY (ROUTINE TESTING W REFLEX): HIV SCREEN 4TH GENERATION: NONREACTIVE

## 2016-10-30 LAB — HEMOGLOBIN A1C
Hgb A1c MFr Bld: 6.8 % — ABNORMAL HIGH (ref 4.8–5.6)
Mean Plasma Glucose: 148 mg/dL

## 2016-10-30 LAB — LACTIC ACID, PLASMA: LACTIC ACID, VENOUS: 1.3 mmol/L (ref 0.5–1.9)

## 2016-10-30 NOTE — ED Notes (Signed)
Pt had stent placed to left Kidney Thursday night here at [facility]; pt now reports temp 100.6 and feeling short of breath; did not take any medication for fever; assisted to wheelchair

## 2016-10-30 NOTE — ED Triage Notes (Signed)
Pt states had renal stent placed this week. Pt states she developed fever today of 100.6 at home. Pt states "I think there is an infection there now." pt denies vomiting, diarrhea.

## 2016-10-30 NOTE — ED Notes (Signed)
Rounded in lobby, checked outside department and ladies BR; pt is not present

## 2016-10-31 ENCOUNTER — Emergency Department
Admission: EM | Admit: 2016-10-31 | Discharge: 2016-10-31 | Disposition: A | Discharge status: Home / Self Care | Payer: Medicare HMO | Attending: Student in an Organized Health Care Education/Training Program | Admitting: Student in an Organized Health Care Education/Training Program

## 2016-10-31 DIAGNOSIS — R109 Unspecified abdominal pain: Secondary | ICD-10-CM

## 2016-10-31 DIAGNOSIS — R062 Wheezing: Secondary | ICD-10-CM

## 2016-10-31 HISTORY — DX: Disorder of kidney and ureter, unspecified: N28.9

## 2016-10-31 LAB — URINALYSIS, COMPLETE (UACMP) WITH MICROSCOPIC
BILIRUBIN URINE: NEGATIVE
Bacteria, UA: NONE SEEN
Glucose, UA: >=500 mg/dL — AB
Ketones, ur: 20 mg/dL — AB
Nitrite: NEGATIVE
PH: 5 (ref 5.0–8.0)
Protein, ur: 100 mg/dL — AB
SPECIFIC GRAVITY, URINE: 1.013 (ref 1.005–1.030)

## 2016-10-31 LAB — URINE CULTURE

## 2016-10-31 MED ORDER — KETOROLAC TROMETHAMINE 30 MG/ML IJ SOLN
15.0000 mg | Freq: Once | INTRAMUSCULAR | Status: AC
  Administered 2016-10-31: 15 mg via INTRAVENOUS
  Filled 2016-10-31: qty 1

## 2016-10-31 MED ORDER — OXYCODONE-ACETAMINOPHEN 5-325 MG PO TABS
1.0000 | ORAL_TABLET | Freq: Once | ORAL | Status: AC
  Administered 2016-10-31: 1 via ORAL
  Filled 2016-10-31: qty 1

## 2016-10-31 MED ORDER — HYDROCODONE-ACETAMINOPHEN 5-325 MG PO TABS: 1.0000 | ORAL_TABLET | ORAL | 0 refills | Status: DC | PRN

## 2016-10-31 MED ORDER — DEXAMETHASONE SODIUM PHOSPHATE 10 MG/ML IJ SOLN
10.0000 mg | Freq: Once | INTRAMUSCULAR | Status: AC
  Administered 2016-10-31: 10 mg via INTRAVENOUS
  Filled 2016-10-31: qty 1

## 2016-10-31 MED ORDER — ALBUTEROL SULFATE HFA 108 (90 BASE) MCG/ACT IN AERS: 2.0000 | INHALATION_SPRAY | Freq: Four times a day (QID) | RESPIRATORY_TRACT | 2 refills | Status: DC | PRN

## 2016-10-31 MED ORDER — IPRATROPIUM-ALBUTEROL 0.5-2.5 (3) MG/3ML IN SOLN
3.0000 mL | Freq: Once | RESPIRATORY_TRACT | Status: AC
  Administered 2016-10-31: 3 mL via RESPIRATORY_TRACT
  Filled 2016-10-31: qty 3

## 2016-10-31 NOTE — ED Provider Notes (Signed)
[facility] Emergency Department Provider Note    First MD Initiated Contact with Patient 10/31/16 [phone]     (approximate)  I have reviewed the triage vital signs and the nursing notes.   HISTORY  Chief Complaint Fever    HPI Tara Cochran is a 56 y.o. female presents to the ER with cough and epigastric pain associated with the coughing as well as flank pain. The cough started over the past 24 hours. She's been having persistent low-grade fevers. No nausea or vomiting. Abdominal pain started after the coughing. States that she had ureteral stent placed for kidney stones with evidence of hydronephrosis. Has had changes in antibiotic from Macrobid to Keflex now is on Ceftin reportedly.She denies any chest pain. No history of asthma.  She does smoke currently.   Past Medical History:  Diagnosis Date  . Diabetes mellitus without complication ([place])   . Hypertension   . Polycythemia   . Renal disorder    Family History  Problem Relation Age of Onset  . Stroke Mother   . Hypertension Mother   . Hypertension Father   . Stroke Father   . Heart disease Sister    Past Surgical History:  Procedure Laterality Date  . CESAREAN SECTION    . CHOLECYSTECTOMY    . COLONOSCOPY N/A 12/12/2014   Procedure: COLONOSCOPY;  Surgeon: Lucilla Lame, MD;  Location: [place];  Service: Gastroenterology;  Laterality: N/A;  . CYSTOSCOPY WITH RETROGRADE PYELOGRAM, URETEROSCOPY AND STENT PLACEMENT Left 10/28/2016   Procedure: CYSTOSCOPY WITH attempted RETROGRADE PYELOGRAM, URETEROSCOPY AND STENT PLACEMENT left;  Surgeon: [name], MD;  Location: [facility];  Service: Urology;  Laterality: Left;  . ESOPHAGOGASTRODUODENOSCOPY N/A 12/12/2014   Procedure: ESOPHAGOGASTRODUODENOSCOPY (EGD);  Surgeon: Lucilla Lame, MD;  Location: [place];  Service: Gastroenterology;  Laterality: N/A;  . GALLBLADDER SURGERY    . POLYPECTOMY  12/12/2014   Procedure:  POLYPECTOMY;  Surgeon: Lucilla Lame, MD;  Location: [place];  Service: Gastroenterology;;   Patient Active Problem List   Diagnosis Date Noted  . Sepsis ([place]) 10/28/2016  . Hyperlipidemia 05/05/2016  . Elevated alkaline phosphatase level 11/04/2015  . Urinary urgency 08/07/2015  . Hypertension 08/07/2015  . Abnormal chest CT 05/26/2015  . Chronic radicular low back pain 02/18/2015  . Chronic bronchitis ([place]) 02/04/2015  . Dyslipidemia associated with type 2 diabetes mellitus ([place]) 02/04/2015  . Elevated hemoglobin ([place]) 12/19/2014  . Nausea   . Duodenitis   . Gastritis with hemorrhage   . Reflux esophagitis   . Loss of weight   . Rectal polyp   . H/O arthritis 12/02/2014  . Airway hyperreactivity 12/02/2014  . Backache 12/02/2014  . Barrett esophagus 12/02/2014  . CAFL (chronic airflow limitation) ([place]) 12/02/2014  . Depression 12/02/2014  . Controlled type 2 diabetes mellitus without complication, with long-term current use of insulin ([place]) 12/02/2014  . Chronic hepatitis C ([place]) 12/02/2014  . H/O: HTN (hypertension) 12/02/2014  . Diarrhea 12/02/2014  . Erythrocytosis 01/15/2013      Prior to Admission medications   Medication Sig Start Date End Date Taking? Authorizing Provider  budesonide-formoterol (SYMBICORT) 160-4.5 MCG/ACT inhaler Inhale 2 puffs into the lungs 2 (two) times daily. 08/06/16  Yes Roselee Nova, MD  buPROPion (WELLBUTRIN XL) 150 MG 24 hr tablet Take 1 tablet (150 mg total) by mouth daily. 08/06/16  Yes Roselee Nova, MD  cefUROXime (CEFTIN) 500 MG tablet Take 1 tablet (500 mg total) by  mouth 2 (two) times daily with a meal. 10/29/16 11/09/16 Yes Mody, Sital, MD  Eluxadoline (VIBERZI) 75 MG TABS Take 75 mg by mouth 2 (two) times daily. 05/04/16  Yes Lucilla Lame, MD  empagliflozin (JARDIANCE) 10 MG TABS tablet Take 10 mg by mouth daily. 08/06/16  Yes Roselee Nova, MD  glipiZIDE (GLUCOTROL) 5 MG tablet Take 1 tablet (5 mg total) by mouth  daily before breakfast. 08/06/16  Yes Roselee Nova, MD  losartan (COZAAR) 100 MG tablet TAKE 1 TABLET BY MOUTH DAILY 10/26/16  Yes Roselee Nova, MD  methocarbamol (ROBAXIN) 500 MG tablet Take 1 tablet (500 mg total) by mouth every 8 (eight) hours as needed for muscle spasms. 05/05/16  Yes Roselee Nova, MD  Multiple Vitamins-Minerals (WOMENS MULTIVITAMIN PLUS PO) daily. 08/27/14  Yes [provider]  nitrofurantoin, macrocrystal-monohydrate, (MACROBID) 100 MG capsule Take 1 capsule (100 mg total) by mouth 2 (two) times daily. 10/26/16 11/02/16 Yes Laban Emperor, PA-C  pantoprazole (PROTONIX) 40 MG tablet Take 1 tablet (40 mg total) by mouth daily before breakfast. 12/12/14  Yes Lucilla Lame, MD  rosuvastatin (CRESTOR) 10 MG tablet Take 1 tablet (10 mg total) by mouth daily. 08/06/16  Yes Roselee Nova, MD  VENTOLIN HFA 108 (90 Base) MCG/ACT inhaler INHALE 2 PUFFS BY MOUTH EVERY 6 HOURS ASNEEDED FOR WHEEZING OR SHORTNESS OF BREATH 07/01/16  Yes Roselee Nova, MD  ACCU-CHEK SMARTVIEW test strip as directed. 11/06/14   [provider]  albuterol (PROVENTIL HFA;VENTOLIN HFA) 108 (90 Base) MCG/ACT inhaler Inhale 2 puffs into the lungs every 6 (six) hours as needed for wheezing or shortness of breath. 10/31/16   Merlyn Lot, MD  HYDROcodone-acetaminophen (NORCO) 5-325 MG tablet Take 1 tablet by mouth every 4 (four) hours as needed for moderate pain. 10/31/16   Merlyn Lot, MD  Incontinence Supply Disposable (BLADDER CONTROL PADS EX ABSORB) MISC daily. 08/27/14   [provider]    Allergies Morphine sulfate    Social History Social History  Substance Use Topics  . Smoking status: Current Every Day Smoker    Packs/day: 0.50    Types: Cigarettes  . Smokeless tobacco: Never Used  . Alcohol use No    Review of Systems Patient denies headaches, rhinorrhea, blurry vision, numbness, shortness of breath, chest pain, edema, cough, abdominal pain, nausea,  vomiting, diarrhea, dysuria, fevers, rashes or hallucinations unless otherwise stated above in HPI. ____________________________________________   PHYSICAL EXAM:  VITAL SIGNS: Vitals:   10/31/16 0143 10/31/16 0358  BP: (!) 145/88 (!) 145/80  Pulse: 88 88  Resp: 20 20  Temp:      Constitutional: Alert and oriented. Well appearing and in no acute distress. Eyes: Conjunctivae are normal. PERRL. EOMI. Head: Atraumatic. Nose: No congestion/rhinnorhea. Mouth/Throat: Mucous membranes are moist.  Oropharynx non-erythematous. Neck: No stridor. Painless ROM. No cervical spine tenderness to palpation Hematological/Lymphatic/Immunilogical: No cervical lymphadenopathy. Cardiovascular: Normal rate, regular rhythm. Grossly normal heart sounds.  Good peripheral circulation. Respiratory: Normal respiratory effort.  No retractions. Lungs with end expiratory wheezing.  No rhonchi Gastrointestinal: Soft and nontender. No distention. No abdominal bruits. No CVA tenderness. Musculoskeletal: No lower extremity tenderness nor edema.  No joint effusions. Neurologic:  Normal speech and language. No gross focal neurologic deficits are appreciated. No gait instability. Skin:  Skin is warm, dry and intact. No rash noted. Psychiatric: Mood and affect are normal. Speech and behavior are normal.  ____________________________________________   LABS (all labs ordered  are listed, but only abnormal results are displayed)  Results for orders placed or performed during the hospital encounter of 10/31/16 (from the past 24 hour(s))  Comprehensive metabolic panel     Status: Abnormal   Collection Time: 10/30/16  8:25 PM  Result Value Ref Range   Sodium 137 135 - 145 mmol/L   Potassium 3.4 (L) 3.5 - 5.1 mmol/L   Chloride 105 101 - 111 mmol/L   CO2 22 22 - 32 mmol/L   Glucose, Bld 168 (H) 65 - 99 mg/dL   BUN 17 6 - 20 mg/dL   Creatinine, Ser 0.52 0.44 - 1.00 mg/dL   Calcium 8.9 8.9 - 10.3 mg/dL   Total Protein  7.0 6.5 - 8.1 g/dL   Albumin 3.2 (L) 3.5 - 5.0 g/dL   AST 39 15 - 41 U/L   ALT 45 14 - 54 U/L   Alkaline Phosphatase 107 38 - 126 U/L   Total Bilirubin 0.8 0.3 - 1.2 mg/dL   GFR calc non Af Amer >60 >60 mL/min   GFR calc Af Amer >60 >60 mL/min   Anion gap 10 5 - 15  Lactic acid, plasma     Status: None   Collection Time: 10/30/16  8:25 PM  Result Value Ref Range   Lactic Acid, Venous 1.3 0.5 - 1.9 mmol/L  CBC with Differential     Status: Abnormal   Collection Time: 10/30/16  8:25 PM  Result Value Ref Range   WBC 10.5 3.6 - 11.0 K/uL   RBC 4.49 3.80 - 5.20 MIL/uL   Hemoglobin 13.9 12.0 - 16.0 g/dL   HCT 40.4 35.0 - 47.0 %   MCV 89.9 80.0 - 100.0 fL   MCH 31.0 26.0 - 34.0 pg   MCHC 34.5 32.0 - 36.0 g/dL   RDW 12.9 11.5 - 14.5 %   Platelets 280 150 - 440 K/uL   Neutrophils Relative % 68 %   Neutro Abs 7.1 (H) 1.4 - 6.5 K/uL   Lymphocytes Relative 17 %   Lymphs Abs 1.8 1.0 - 3.6 K/uL   Monocytes Relative 14 %   Monocytes Absolute 1.4 (H) 0.2 - 0.9 K/uL   Eosinophils Relative 1 %   Eosinophils Absolute 0.1 0 - 0.7 K/uL   Basophils Relative 0 %   Basophils Absolute 0.0 0 - 0.1 K/uL  Urinalysis, Complete w Microscopic     Status: Abnormal   Collection Time: 10/31/16  1:50 AM  Result Value Ref Range   Color, Urine YELLOW (A) YELLOW   APPearance CLOUDY (A) CLEAR   Specific Gravity, Urine 1.013 1.005 - 1.030   pH 5.0 5.0 - 8.0   Glucose, UA >=500 (A) NEGATIVE mg/dL   Hgb urine dipstick LARGE (A) NEGATIVE   Bilirubin Urine NEGATIVE NEGATIVE   Ketones, ur 20 (A) NEGATIVE mg/dL   Protein, ur 100 (A) NEGATIVE mg/dL   Nitrite NEGATIVE NEGATIVE   Leukocytes, UA MODERATE (A) NEGATIVE   RBC / HPF TOO NUMEROUS TO COUNT 0 - 5 RBC/hpf   WBC, UA TOO NUMEROUS TO COUNT 0 - 5 WBC/hpf   Bacteria, UA NONE SEEN NONE SEEN   Squamous Epithelial / LPF 0-5 (A) NONE SEEN   Mucous PRESENT     ____________________________________________  EKG____________________________________________  RADIOLOGY  I personally reviewed all radiographic images ordered to evaluate for the above acute complaints and reviewed radiology reports and findings.  These findings were personally discussed with the patient.  Please see medical record for radiology report.  ____________________________________________   PROCEDURES  Procedure(s) performed:  Procedures    Critical Care performed: no ____________________________________________   INITIAL IMPRESSION / ASSESSMENT AND PLAN / ED COURSE  Pertinent labs & imaging results that were available during my care of the patient were reviewed by me and considered in my medical decision making (see chart for details).  DDX: bronchtiis, pna, chf, stone, gastritis, pyelo  Tara Cochran is a 56 y.o. who presents to the ED with flank pain and epigastric pain as described above. Patient's afebrile and otherwise hemodynamically stable. Does not appear to be in any acute distress. No clinical findings to suggest pneumonia. Her abdominal exam is soft and benign. Based on her recent stent procedure will get urinalysis to evaluate for any persistent infection not improved with antibiotics prescribed. Do not believe her presentation is consistent with acute kidney stone. May have some residual referred pain from stenting. Clinically consistent with cholelithiasis or cholecystitis.  Based on her wheezing on exam and do believe this is likely bronchitis induced cough causing some epigastric discomfort. Will give nebulizer treatment.  The patient will be placed on continuous pulse oximetry and telemetry for monitoring.  Laboratory evaluation will be sent to evaluate for the above complaints.     Clinical Course as of Nov 01 723  Nancy Fetter Oct 31, 2016  0229 Urine is now nitrite negative. There is a large red cells and white cells but no bacteria. Most  consistent with urine after stent placement. Do not feel is consistent with pyelonephritis. Patient is already on antibiotics and does not have any significant leukocytosis or lactic acidosis. Presentation is most consistent with bronchitis given wheezing. Patient in no acute distress. The patient be stable for follow-up with PCP and urology.  [PR]    Clinical Course User Index [PR] Merlyn Lot, MD     ____________________________________________   FINAL CLINICAL IMPRESSION(S) / ED DIAGNOSES  Final diagnoses:  Acute right flank pain  Wheezing      NEW MEDICATIONS STARTED DURING THIS VISIT:  Discharge Medication List as of 10/31/2016  2:46 AM    START taking these medications   Details  !! albuterol (PROVENTIL HFA;VENTOLIN HFA) 108 (90 Base) MCG/ACT inhaler Inhale 2 puffs into the lungs every 6 (six) hours as needed for wheezing or shortness of breath., Starting Sun 10/31/2016, Print    HYDROcodone-acetaminophen (NORCO) 5-325 MG tablet Take 1 tablet by mouth every 4 (four) hours as needed for moderate pain., Starting Sun 10/31/2016, Print     !! - Potential duplicate medications found. Please discuss with provider.       Note:  This document was prepared using Dragon voice recognition software and may include unintentional dictation errors.    Merlyn Lot, MD 10/31/16 [phone]

## 2016-10-31 NOTE — ED Notes (Signed)
Pt had moved for the third time and is actually present; understands she will be taken to a treatment room as soon as one is available

## 2016-10-31 NOTE — ED Notes (Signed)
Pt advised to sit in lobby until 0530 at which time pt will be safe to drive home. Pt provided meal tray and water and verbalized agreement of such. First nurse Margarita Grizzle RN aware.

## 2016-10-31 NOTE — Discharge Instructions (Signed)

## 2016-10-31 NOTE — ED Notes (Signed)
E-signature pad not working in room. Pt verbalized understanding of follow up care.

## 2016-11-02 ENCOUNTER — Other Ambulatory Visit: Payer: Self-pay | Admitting: Radiology

## 2016-11-02 DIAGNOSIS — N201 Calculus of ureter: Secondary | ICD-10-CM

## 2016-11-02 NOTE — Telephone Encounter (Signed)
Notified pt of surgery with Dr Pilar Jarvis scheduled on 11/10/16, pre-admit testing appt & to call day prior to surgery for arrival time to SDS. Questions answered. Pt voices understanding.

## 2016-11-03 LAB — CULTURE, BLOOD (ROUTINE X 2)
CULTURE: NO GROWTH
Culture: NO GROWTH
Special Requests: ADEQUATE
Special Requests: ADEQUATE

## 2016-11-04 ENCOUNTER — Ambulatory Visit: Payer: Commercial Managed Care - HMO | Admitting: Family Medicine

## 2016-11-04 ENCOUNTER — Encounter
Admission: RE | Admit: 2016-11-04 | Discharge: 2016-11-04 | Disposition: A | Discharge status: Home / Self Care | Payer: Medicare HMO | Source: Ambulatory Visit | Attending: Urology | Admitting: Urology

## 2016-11-04 DIAGNOSIS — Z01812 Encounter for preprocedural laboratory examination: Secondary | ICD-10-CM | POA: Insufficient documentation

## 2016-11-04 DIAGNOSIS — Z01818 Encounter for other preprocedural examination: Secondary | ICD-10-CM | POA: Diagnosis present

## 2016-11-04 HISTORY — DX: Personal history of other diseases of the digestive system: Z87.19

## 2016-11-04 HISTORY — DX: Inflammatory liver disease, unspecified: K75.9

## 2016-11-04 HISTORY — DX: Anemia, unspecified: D64.9

## 2016-11-04 HISTORY — DX: Gastro-esophageal reflux disease without esophagitis: K21.9

## 2016-11-04 HISTORY — DX: Irritability and anger: R45.4

## 2016-11-04 HISTORY — DX: Unspecified osteoarthritis, unspecified site: M19.90

## 2016-11-04 HISTORY — DX: Chronic obstructive pulmonary disease, unspecified: J44.9

## 2016-11-04 HISTORY — DX: Personal history of urinary calculi: Z87.442

## 2016-11-04 NOTE — Patient Instructions (Signed)
  Your procedure is scheduled on:11/10/16 Report to Day Surgery.[facility] SECOND FLOOR To find out your arrival time please call [phone] between 1PM - 3PM on 11/09/16.  Remember: Instructions that are not followed completely may result in serious medical risk, up to and including death, or upon the discretion of your surgeon and anesthesiologist your surgery may need to be rescheduled.    __X__ 1. Do not eat food or drink liquids after midnight. No gum chewing or hard candies.     _X___ 2. No Alcohol for 24 hours before or after surgery.   _X___ 3. Do Not Smoke For 24 Hours Prior to Your Surgery.   ____ 4. Bring all medications with you on the day of surgery if instructed.    __X__ 5. Notify your doctor if there is any change in your medical condition     (cold, fever, infections).       Do not wear jewelry, make-up, hairpins, clips or nail polish.  Do not wear lotions, powders, or perfumes. You may wear deodorant.  Do not shave 48 hours prior to surgery. Men may shave face and neck.  Do not bring valuables to the hospital.    [facility] is not responsible for any belongings or valuables.               Contacts, dentures or bridgework may not be worn into surgery.  Leave your suitcase in the car. After surgery it may be brought to your room.  For patients admitted to the hospital, discharge time is determined by your                treatment team.   Patients discharged the day of surgery will not be allowed to drive home.     _X___ Take these medicines the morning of surgery with A SIP OF WATER:    1.CRESTOR  2. WELLBUTRIN   3.VIBERZI   4.COZAAR  5.  6.  ____ Fleet Enema (as directed)   ____ Use CHG Soap as directed  ____ Use inhalers on the day of surgery  ____ Stop metformin 2 days prior to surgery    ____ Take 1/2 of usual insulin dose the night before surgery and none on the morning of surgery.   ____ Stop Coumadin/Plavix/aspirin on   ____ Stop  Anti-inflammatories on   ____ Stop supplements until after surgery.    ____ Bring C-Pap to the hospital.

## 2016-11-05 LAB — URINE CULTURE: Culture: <10000 — AB

## 2016-11-08 ENCOUNTER — Ambulatory Visit: Payer: Medicare HMO | Admitting: Family Medicine

## 2016-11-09 ENCOUNTER — Encounter: Payer: Self-pay | Admitting: Family Medicine

## 2016-11-09 ENCOUNTER — Ambulatory Visit (INDEPENDENT_AMBULATORY_CARE_PROVIDER_SITE_OTHER): Payer: Medicare HMO | Admitting: Family Medicine

## 2016-11-09 DIAGNOSIS — E119 Type 2 diabetes mellitus without complications: Secondary | ICD-10-CM

## 2016-11-09 DIAGNOSIS — F3341 Major depressive disorder, recurrent, in partial remission: Secondary | ICD-10-CM | POA: Diagnosis not present

## 2016-11-09 DIAGNOSIS — Z794 Long term (current) use of insulin: Secondary | ICD-10-CM | POA: Diagnosis not present

## 2016-11-09 DIAGNOSIS — E1169 Type 2 diabetes mellitus with other specified complication: Secondary | ICD-10-CM | POA: Diagnosis not present

## 2016-11-09 DIAGNOSIS — E785 Hyperlipidemia, unspecified: Secondary | ICD-10-CM | POA: Diagnosis not present

## 2016-11-09 MED ORDER — BUPROPION HCL ER (XL) 150 MG PO TB24: 150.0000 mg | ORAL_TABLET | Freq: Every day | ORAL | 0 refills | Status: DC

## 2016-11-09 MED ORDER — ROSUVASTATIN CALCIUM 10 MG PO TABS: 10.0000 mg | ORAL_TABLET | Freq: Every day | ORAL | 0 refills | Status: DC

## 2016-11-09 MED ORDER — EMPAGLIFLOZIN 10 MG PO TABS: 10.0000 mg | ORAL_TABLET | Freq: Every day | ORAL | 0 refills | Status: DC

## 2016-11-09 MED ORDER — DEXTROSE 5 % IV SOLN
120.0000 mg | INTRAVENOUS | Status: AC
  Administered 2016-11-10: 120 mg via INTRAVENOUS
  Filled 2016-11-09: qty 3

## 2016-11-09 MED ORDER — BUPROPION HCL ER (XL) 300 MG PO TB24: 300.0000 mg | ORAL_TABLET | Freq: Every day | ORAL | 0 refills | Status: DC

## 2016-11-09 MED ORDER — GLIPIZIDE 5 MG PO TABS: 5.0000 mg | ORAL_TABLET | Freq: Every day | ORAL | 0 refills | Status: DC

## 2016-11-09 MED ORDER — SODIUM CHLORIDE 0.9 % IV SOLN
2.0000 g | INTRAVENOUS | Status: AC
  Administered 2016-11-10: 2 g via INTRAVENOUS

## 2016-11-09 NOTE — Progress Notes (Signed)
Name: Tara Cochran   MRN: [id number]    DOB: 19-Aug-1960   Date:11/09/2016       Progress Note  Subjective  Chief Complaint  Chief Complaint  Patient presents with  . Follow-up    3 mo  . Medication Refill    Depression         This is a chronic problem.  The onset quality is gradual.   The problem has been gradually worsening since onset.  Associated symptoms include irritable and headaches.  Associated symptoms include no fatigue, no helplessness, no hopelessness, no myalgias and not sad.  Past treatments include SSRIs - Selective serotonin reuptake inhibitors (Wellbutrin XL 150 mg daily).  Compliance with treatment is good.  Previous treatment provided mild (has been taking Wellbutrin XL 300 mg twice daily.) relief. Diabetes  She presents for her follow-up diabetic visit. She has type 2 diabetes mellitus. Her disease course has been stable. Hypoglycemia symptoms include headaches. Pertinent negatives for diabetes include no chest pain, no fatigue, no foot paresthesias, no polydipsia, no polyphagia and no polyuria. Current diabetic treatment includes oral agent (dual therapy). Frequency home blood tests: has not been checking BG at home.  Hyperlipidemia  This is a chronic problem. The problem is controlled. Recent lipid tests were reviewed and are normal. Pertinent negatives include no chest pain, leg pain, myalgias or shortness of breath. Current antihyperlipidemic treatment includes statins.    Past Medical History:  Diagnosis Date  . Anemia    polycythemia  . Arthritis    hips/ knees/spine  . COPD (chronic obstructive pulmonary disease) ([place])   . Diabetes mellitus without complication ([place])   . GERD (gastroesophageal reflux disease)    food comes up when bends over  . Hepatitis    h/o tx x 3  . History of hiatal hernia   . History of kidney stones   . Hypertension   . Irritable behavior    gets mad easily  . Polycythemia   . Renal disorder     Past Surgical  History:  Procedure Laterality Date  . CESAREAN SECTION    . CHOLECYSTECTOMY    . COLONOSCOPY N/A 12/12/2014   Procedure: COLONOSCOPY;  Surgeon: Lucilla Lame, MD;  Location: [place];  Service: Gastroenterology;  Laterality: N/A;  . CYSTOSCOPY WITH RETROGRADE PYELOGRAM, URETEROSCOPY AND STENT PLACEMENT Left 10/28/2016   Procedure: CYSTOSCOPY WITH attempted RETROGRADE PYELOGRAM, URETEROSCOPY AND STENT PLACEMENT left;  Surgeon: [name], MD;  Location: [facility];  Service: Urology;  Laterality: Left;  . ESOPHAGOGASTRODUODENOSCOPY N/A 12/12/2014   Procedure: ESOPHAGOGASTRODUODENOSCOPY (EGD);  Surgeon: Lucilla Lame, MD;  Location: [place];  Service: Gastroenterology;  Laterality: N/A;  . GALLBLADDER SURGERY    . POLYPECTOMY  12/12/2014   Procedure: POLYPECTOMY;  Surgeon: Lucilla Lame, MD;  Location: [place];  Service: Gastroenterology;;    Family History  Problem Relation Age of Onset  . Stroke Mother   . Hypertension Mother   . Hypertension Father   . Stroke Father   . Heart disease Sister     Social History   Social History  . Marital status: Divorced    Spouse name: N/A  . Number of children: N/A  . Years of education: N/A   Occupational History  . Not on file.   Social History Main Topics  . Smoking status: Current Every Day Smoker    Packs/day: 0.50    Types: Cigarettes  . Smokeless tobacco: Never Used  . Alcohol use No  .  Drug use: Yes    Frequency: 2.0 times per week    Types: Marijuana  . Sexual activity: Not on file   Other Topics Concern  . Not on file   Social History Narrative  . No narrative on file     Current Outpatient Prescriptions:  .  ACCU-CHEK SMARTVIEW test strip, as directed., Disp: , Rfl:  .  albuterol (PROVENTIL HFA;VENTOLIN HFA) 108 (90 Base) MCG/ACT inhaler, Inhale 2 puffs into the lungs every 6 (six) hours as needed for wheezing or shortness of breath., Disp: 1 Inhaler, Rfl: 2 .  budesonide-formoterol  (SYMBICORT) 160-4.5 MCG/ACT inhaler, Inhale 2 puffs into the lungs 2 (two) times daily. (Patient taking differently: Inhale 2 puffs into the lungs 2 (two) times daily as needed. ), Disp: 1 Inhaler, Rfl: 0 .  buPROPion (WELLBUTRIN XL) 150 MG 24 hr tablet, Take 1 tablet (150 mg total) by mouth daily., Disp: 90 tablet, Rfl: 0 .  cefUROXime (CEFTIN) 500 MG tablet, Take 1 tablet (500 mg total) by mouth 2 (two) times daily with a meal. (Patient taking differently: Take 500 mg by mouth 2 (two) times daily with a meal. Should be completed by 11-08-2016), Disp: 22 tablet, Rfl: 0 .  Eluxadoline (VIBERZI) 75 MG TABS, Take 75 mg by mouth 2 (two) times daily., Disp: 60 tablet, Rfl: 5 .  empagliflozin (JARDIANCE) 10 MG TABS tablet, Take 10 mg by mouth daily., Disp: 90 tablet, Rfl: 0 .  glipiZIDE (GLUCOTROL) 5 MG tablet, Take 1 tablet (5 mg total) by mouth daily before breakfast., Disp: 90 tablet, Rfl: 0 .  Incontinence Supply Disposable (BLADDER CONTROL PADS EX ABSORB) MISC, daily., Disp: , Rfl:  .  losartan (COZAAR) 100 MG tablet, TAKE 1 TABLET BY MOUTH DAILY, Disp: 90 tablet, Rfl: 0 .  Multiple Vitamins-Minerals (WOMENS MULTIVITAMIN PLUS PO), Take 1 tablet by mouth daily. , Disp: , Rfl:  .  naproxen sodium (ANAPROX) 220 MG tablet, Take 440 mg by mouth 2 (two) times daily as needed (pain)., Disp: , Rfl:  .  rosuvastatin (CRESTOR) 10 MG tablet, Take 1 tablet (10 mg total) by mouth daily., Disp: 90 tablet, Rfl: 0  Allergies  Allergen Reactions  . Morphine Sulfate Itching     Review of Systems  Constitutional: Negative for fatigue.  Respiratory: Negative for shortness of breath.   Cardiovascular: Negative for chest pain.  Musculoskeletal: Negative for myalgias.  Neurological: Positive for headaches.  Endo/Heme/Allergies: Negative for polydipsia and polyphagia.  Psychiatric/Behavioral: Positive for depression.     Objective  Vitals:   11/09/16 1549  BP: 139/74  Pulse: 86  Resp: 16  Temp: 98.2 F  (36.8 C)  TempSrc: Oral  SpO2: 96%  Weight: 173 lb 6.4 oz (78.7 kg)  Height: 4\' 10"  (1.473 m)    Physical Exam  Constitutional: She is oriented to person, place, and time and well-developed, well-nourished, and in no distress. She is irritable.  HENT:  Head: Normocephalic and atraumatic.  Cardiovascular: Normal rate, regular rhythm and normal heart sounds.   No murmur heard. Pulmonary/Chest: Effort normal and breath sounds normal. She has no wheezes.  Abdominal: Soft. Bowel sounds are normal. There is no tenderness.  Neurological: She is alert and oriented to person, place, and time.  Psychiatric: Mood, memory, affect and judgment normal.  Nursing note and vitals reviewed.    Assessment & Plan  1. Recurrent major depressive disorder, in partial remission ([place]) Explained that Wellbutrin has a maximum allowable daily dose of 450 mg, will increase to  the maximum dose, reassess in 3 months. If symptoms do not improve, consider changing to a different agent - buPROPion (WELLBUTRIN XL) 300 MG 24 hr tablet; Take 1 tablet (300 mg total) by mouth daily.  Dispense: 90 tablet; Refill: 0 - buPROPion (WELLBUTRIN XL) 150 MG 24 hr tablet; Take 1 tablet (150 mg total) by mouth daily.  Dispense: 90 tablet; Refill: 0  2. Controlled type 2 diabetes mellitus without complication, without long-term current use of insulin ([facility])  - glipiZIDE (GLUCOTROL) 5 MG tablet; Take 1 tablet (5 mg total) by mouth daily before breakfast.  Dispense: 90 tablet; Refill: 0 - empagliflozin (JARDIANCE) 10 MG TABS tablet; Take 10 mg by mouth daily.  Dispense: 90 tablet; Refill: 0 - Urine Microalbumin w/creat. ratio  3. Dyslipidemia associated with type 2 diabetes mellitus ([facility])  - rosuvastatin (CRESTOR) 10 MG tablet; Take 1 tablet (10 mg total) by mouth daily.  Dispense: 90 tablet; Refill: 0 - Lipid panel   [facility] 11/09/2016 4:04 PM

## 2016-11-10 ENCOUNTER — Ambulatory Visit
Admission: RE | Admit: 2016-11-10 | Discharge: 2016-11-10 | Disposition: A | Discharge status: Home / Self Care | Payer: Medicare HMO | Source: Ambulatory Visit | Attending: Urology | Admitting: Urology

## 2016-11-10 ENCOUNTER — Ambulatory Visit: Payer: Medicare HMO | Admitting: Registered Nurse

## 2016-11-10 ENCOUNTER — Encounter
Admission: RE | Disposition: A | Discharge status: Home / Self Care | Payer: Self-pay | Source: Ambulatory Visit | Attending: Urology

## 2016-11-10 ENCOUNTER — Encounter: Payer: Self-pay | Admitting: *Deleted

## 2016-11-10 DIAGNOSIS — F17210 Nicotine dependence, cigarettes, uncomplicated: Secondary | ICD-10-CM | POA: Insufficient documentation

## 2016-11-10 DIAGNOSIS — Z87440 Personal history of urinary (tract) infections: Secondary | ICD-10-CM | POA: Diagnosis not present

## 2016-11-10 DIAGNOSIS — D72829 Elevated white blood cell count, unspecified: Secondary | ICD-10-CM | POA: Diagnosis not present

## 2016-11-10 DIAGNOSIS — K219 Gastro-esophageal reflux disease without esophagitis: Secondary | ICD-10-CM | POA: Diagnosis not present

## 2016-11-10 DIAGNOSIS — Z7984 Long term (current) use of oral hypoglycemic drugs: Secondary | ICD-10-CM | POA: Diagnosis not present

## 2016-11-10 DIAGNOSIS — E119 Type 2 diabetes mellitus without complications: Secondary | ICD-10-CM | POA: Insufficient documentation

## 2016-11-10 DIAGNOSIS — F419 Anxiety disorder, unspecified: Secondary | ICD-10-CM | POA: Diagnosis not present

## 2016-11-10 DIAGNOSIS — I10 Essential (primary) hypertension: Secondary | ICD-10-CM | POA: Insufficient documentation

## 2016-11-10 DIAGNOSIS — J449 Chronic obstructive pulmonary disease, unspecified: Secondary | ICD-10-CM | POA: Insufficient documentation

## 2016-11-10 DIAGNOSIS — N201 Calculus of ureter: Secondary | ICD-10-CM | POA: Insufficient documentation

## 2016-11-10 DIAGNOSIS — D649 Anemia, unspecified: Secondary | ICD-10-CM | POA: Diagnosis not present

## 2016-11-10 HISTORY — PX: CYSTOSCOPY W/ URETERAL STENT PLACEMENT: SHX1429

## 2016-11-10 HISTORY — PX: URETEROSCOPY: SHX842

## 2016-11-10 HISTORY — PX: STONE EXTRACTION WITH BASKET: SHX5318

## 2016-11-10 LAB — MICROALBUMIN / CREATININE URINE RATIO
Creatinine, Urine: 87 mg/dL (ref 20–320)
MICROALB UR: 21.3 mg/dL
Microalb Creat Ratio: 245 mcg/mg creat — ABNORMAL HIGH (ref <30)

## 2016-11-10 LAB — URINE DRUG SCREEN, QUALITATIVE (ARMC ONLY)
Amphetamines, Ur Screen: NOT DETECTED
BARBITURATES, UR SCREEN: NOT DETECTED
BENZODIAZEPINE, UR SCRN: NOT DETECTED
Cannabinoid 50 Ng, Ur ~~LOC~~: POSITIVE — AB
Cocaine Metabolite,Ur ~~LOC~~: NOT DETECTED
MDMA (Ecstasy)Ur Screen: NOT DETECTED
METHADONE SCREEN, URINE: NOT DETECTED
OPIATE, UR SCREEN: NOT DETECTED
Phencyclidine (PCP) Ur S: NOT DETECTED
TRICYCLIC, UR SCREEN: NOT DETECTED

## 2016-11-10 LAB — LIPID PANEL

## 2016-11-10 LAB — GLUCOSE, CAPILLARY
Glucose-Capillary: 151 mg/dL — ABNORMAL HIGH (ref 65–99)
Glucose-Capillary: 139 mg/dL — ABNORMAL HIGH (ref 65–99)

## 2016-11-10 SURGERY — URETEROSCOPY
Anesthesia: General | Site: Ureter | Laterality: Left | Wound class: Clean Contaminated

## 2016-11-10 MED ORDER — LIDOCAINE HCL (CARDIAC) 20 MG/ML IV SOLN
INTRAVENOUS | Status: DC | PRN
  Administered 2016-11-10: 50 mg via INTRAVENOUS

## 2016-11-10 MED ORDER — ONDANSETRON HCL 4 MG/2ML IJ SOLN: 4.0000 mg | Freq: Once | INTRAMUSCULAR | Status: DC | PRN

## 2016-11-10 MED ORDER — FENTANYL CITRATE (PF) 100 MCG/2ML IJ SOLN
INTRAMUSCULAR | Status: DC | PRN
  Administered 2016-11-10: 100 ug via INTRAVENOUS

## 2016-11-10 MED ORDER — FENTANYL CITRATE (PF) 100 MCG/2ML IJ SOLN
INTRAMUSCULAR | Status: AC
  Filled 2016-11-10: qty 2

## 2016-11-10 MED ORDER — ONDANSETRON HCL 4 MG/2ML IJ SOLN
INTRAMUSCULAR | Status: AC
  Filled 2016-11-10: qty 2

## 2016-11-10 MED ORDER — PHENYLEPHRINE HCL 10 MG/ML IJ SOLN
INTRAMUSCULAR | Status: DC | PRN
  Administered 2016-11-10 (×2): 100 ug via INTRAVENOUS

## 2016-11-10 MED ORDER — MIDAZOLAM HCL 2 MG/2ML IJ SOLN
INTRAMUSCULAR | Status: AC
  Filled 2016-11-10: qty 2

## 2016-11-10 MED ORDER — FAMOTIDINE 20 MG PO TABS
20.0000 mg | ORAL_TABLET | Freq: Once | ORAL | Status: AC
  Administered 2016-11-10: 20 mg via ORAL

## 2016-11-10 MED ORDER — MIDAZOLAM HCL 2 MG/2ML IJ SOLN
INTRAMUSCULAR | Status: DC | PRN
  Administered 2016-11-10: 2 mg via INTRAVENOUS

## 2016-11-10 MED ORDER — SODIUM CHLORIDE 0.9 % IV SOLN
INTRAVENOUS | Status: AC
  Filled 2016-11-10: qty 2000

## 2016-11-10 MED ORDER — SUCCINYLCHOLINE CHLORIDE 20 MG/ML IJ SOLN
INTRAMUSCULAR | Status: DC | PRN
  Administered 2016-11-10: 80 mg via INTRAVENOUS

## 2016-11-10 MED ORDER — GLYCOPYRROLATE 0.2 MG/ML IJ SOLN
INTRAMUSCULAR | Status: AC
  Filled 2016-11-10: qty 1

## 2016-11-10 MED ORDER — IOTHALAMATE MEGLUMINE 43 % IV SOLN
INTRAVENOUS | Status: DC | PRN
Start: 2016-11-10 — End: 2016-11-10
  Administered 2016-11-10: 20 mL via URETHRAL

## 2016-11-10 MED ORDER — FENTANYL CITRATE (PF) 100 MCG/2ML IJ SOLN: 25.0000 ug | INTRAMUSCULAR | Status: DC | PRN

## 2016-11-10 MED ORDER — DEXAMETHASONE SODIUM PHOSPHATE 10 MG/ML IJ SOLN
INTRAMUSCULAR | Status: AC
  Filled 2016-11-10: qty 1

## 2016-11-10 MED ORDER — CEPHALEXIN 500 MG PO CAPS: 500.0000 mg | ORAL_CAPSULE | Freq: Three times a day (TID) | ORAL | 0 refills | Status: DC

## 2016-11-10 MED ORDER — DEXAMETHASONE SODIUM PHOSPHATE 10 MG/ML IJ SOLN
INTRAMUSCULAR | Status: DC | PRN
  Administered 2016-11-10: 10 mg via INTRAVENOUS

## 2016-11-10 MED ORDER — SODIUM CHLORIDE 0.9 % IV SOLN
INTRAVENOUS | Status: DC
  Administered 2016-11-10: 08:00:00 via INTRAVENOUS

## 2016-11-10 MED ORDER — HYDROCODONE-ACETAMINOPHEN 5-325 MG PO TABS: 1.0000 | ORAL_TABLET | ORAL | 0 refills | Status: DC | PRN

## 2016-11-10 MED ORDER — ROCURONIUM BROMIDE 100 MG/10ML IV SOLN
INTRAVENOUS | Status: DC | PRN
  Administered 2016-11-10: 30 mg via INTRAVENOUS

## 2016-11-10 MED ORDER — LIDOCAINE HCL (PF) 2 % IJ SOLN
INTRAMUSCULAR | Status: AC
  Filled 2016-11-10: qty 2

## 2016-11-10 MED ORDER — SUGAMMADEX SODIUM 200 MG/2ML IV SOLN
INTRAVENOUS | Status: DC | PRN
  Administered 2016-11-10: 350 mg via INTRAVENOUS

## 2016-11-10 MED ORDER — FAMOTIDINE 20 MG PO TABS
ORAL_TABLET | ORAL | Status: AC
  Filled 2016-11-10: qty 1

## 2016-11-10 MED ORDER — PROPOFOL 10 MG/ML IV BOLUS
INTRAVENOUS | Status: AC
  Filled 2016-11-10: qty 40

## 2016-11-10 MED ORDER — PROPOFOL 10 MG/ML IV BOLUS
INTRAVENOUS | Status: DC | PRN
  Administered 2016-11-10: 150 mg via INTRAVENOUS

## 2016-11-10 MED ORDER — ROCURONIUM BROMIDE 50 MG/5ML IV SOLN
INTRAVENOUS | Status: AC
  Filled 2016-11-10: qty 1

## 2016-11-10 SURGICAL SUPPLY — 27 items
BACTOSHIELD CHG 4% 4OZ (MISCELLANEOUS) ×2
BASKET ZERO TIP 1.9FR (BASKET) ×4 IMPLANT
CATH URETL 5X70 OPEN END (CATHETERS) ×4 IMPLANT
CNTNR SPEC 2.5X3XGRAD LEK (MISCELLANEOUS) ×2
CONT SPEC 4OZ STER OR WHT (MISCELLANEOUS) ×2
CONTAINER SPEC 2.5X3XGRAD LEK (MISCELLANEOUS) ×2 IMPLANT
GLOVE BIO SURGEON STRL SZ7 (GLOVE) ×4 IMPLANT
GLOVE BIO SURGEON STRL SZ7.5 (GLOVE) ×4 IMPLANT
GOWN STRL REUS W/ TWL LRG LVL4 (GOWN DISPOSABLE) ×2 IMPLANT
GOWN STRL REUS W/TWL LRG LVL4 (GOWN DISPOSABLE) ×2
GOWN STRL REUS W/TWL XL LVL3 (GOWN DISPOSABLE) ×4 IMPLANT
GUIDEWIRE SUPER STIFF (WIRE) IMPLANT
INTRODUCER DILATOR DOUBLE (INTRODUCER) ×4 IMPLANT
KIT RM TURNOVER CYSTO AR (KITS) ×4 IMPLANT
PACK CYSTO AR (MISCELLANEOUS) ×4 IMPLANT
SCRUB CHG 4% DYNA-HEX 4OZ (MISCELLANEOUS) ×2 IMPLANT
SENSORWIRE 0.038 NOT ANGLED (WIRE) ×8
SET CYSTO W/LG BORE CLAMP LF (SET/KITS/TRAYS/PACK) ×4 IMPLANT
SHEATH URETERAL 13/15X36 1L (SHEATH) IMPLANT
SOL .9 NS 3000ML IRR  AL (IV SOLUTION) ×2
SOL .9 NS 3000ML IRR UROMATIC (IV SOLUTION) ×2 IMPLANT
STENT URET 6FRX24 CONTOUR (STENTS) ×4 IMPLANT
STENT URET 6FRX26 CONTOUR (STENTS) IMPLANT
SURGILUBE 2OZ TUBE FLIPTOP (MISCELLANEOUS) ×4 IMPLANT
SYRINGE IRR TOOMEY STRL 70CC (SYRINGE) ×4 IMPLANT
WATER STERILE IRR 1000ML POUR (IV SOLUTION) ×4 IMPLANT
WIRE SENSOR 0.038 NOT ANGLED (WIRE) ×4 IMPLANT

## 2016-11-10 NOTE — Transfer of Care (Signed)
Immediate Anesthesia Transfer of Care Note  Patient: Tara Cochran  Procedure(s) Performed: Procedure(s): URETEROSCOPY (Left) CYSTOSCOPY WITH STENT REPLACEMENT (Left) STONE EXTRACTION WITH BASKET (Left)  Patient Location: PACU  Anesthesia Type:General  Level of Consciousness: sedated  Airway & Oxygen Therapy: Patient Spontanous Breathing and Patient connected to face mask oxygen  Post-op Assessment: Report given to RN and Post -op Vital signs reviewed and stable  Post vital signs: Reviewed and stable  Last Vitals:  Vitals:   11/10/16 0743 11/10/16 0955  BP: 122/73 (!) 132/42  Pulse: 84 88  Resp: 16 (!) 21  Temp: 36.7 C 29.5 C    Complications: No apparent anesthesia complications

## 2016-11-10 NOTE — Op Note (Signed)
Date of procedure: 11/10/16  Preoperative diagnosis:  1. Left ureteral stone   Postoperative diagnosis:  1. Left ureteral stone   Procedure: 1. Cystoscopy 2. Left ureteroscopy 3. Stone basketing 4. Left retrograde pyelogram with interpretation 5. Left ureteral stent exchange 6 [place] by 24 cm  Surgeon: Baruch Gouty, MD  Anesthesia: General  Complications: None  Intraoperative findings: 1 stone was found in the distal ureter which was removed atraumatically with the stone basket. Pain ureteroscopy revealed no other stones. Left retrograde pyelogram on the left side did show no filling defects.  EBL: None  Specimens: Left ureteral stone pathology  Drains: Left ureteral 6 French by 24 cm double-J ureteral stent  Disposition: Stable to the postanesthesia care unit  Indication for procedure: The patient is a 56 y.o. female with history of urinary tract infection in the setting of obstructive calculi of the left UVJ 2 was temporized left ureteral stent and presents today for definitive stone management.  After reviewing the management options for treatment, the patient elected to proceed with the above surgical procedure(s). We have discussed the potential benefits and risks of the procedure, side effects of the proposed treatment, the likelihood of the patient achieving the goals of the procedure, and any potential problems that might occur during the procedure or recuperation. Informed consent has been obtained.  Description of procedure: The patient was met in the preoperative area. All risks, benefits, and indications of the procedure were described in great detail. The patient consented to the procedure. Preoperative antibiotics were given. The patient was taken to the operative theater. General anesthesia was induced per the anesthesia service. The patient was then placed in the dorsal lithotomy position and prepped and draped in the usual sterile fashion. A preoperative timeout  was called.   A 21 French 30 cystoscope was inserted into the patient's bladder per urethra atraumatically. The left renal stent was grasped flexible graspers and brought to level of the urethral meatus and a sensor wire was exchanged through the up to level of the renal pelvis under fluoroscopy. The stent was removed. A semirigid ureteroscope was introduced into the left ureteral orifice. Upon entering left ear orifice a stone was seen that was much smaller than the size of the ureter. It was grasped with stone basket and removed intact and sent to pathology. The patient did have 2 UVJ stones on her CT scan prior to her previous stent placement. Pan ureteroscopy did not reveal any stones. As the stone was at the UPJ was unlikely to have gone up into the left kidney. This was confirmed with a left retrograde pyelogram which showed no filling defects. At this point the  second stone was not identified again after further careful inspection of the ureter. At this point, was presumed to have passed. The patient did subsequently significant edema of the distal left ureter. The ureteroscope was withdrawn. The cystoscope was reassembled over the sensor wire. A 6 French by 24 cm double-J ureteral stent was placed. A sensor wire was removed. A curl seen in the renal pelvis on fluoroscopy and the urinary bladder direct visualization. There is drainage of clear yellow urine at this point. The patient's bladder was drained. The bladder was then drained, and she was transferred in stable condition to the post anesthesia care unit.  Plan:  The patient will be discharged home. She'll follow-up in 2 weeks for left ureteral stent removal. We're leaving a longer due to the significant edema she had from her stones in  the distal ureter to allow this to resolve. She'll need a renal ultrasound 1 month after stent removal to rule out iatrogenic hydronephrosis. We'll discuss her stone analysis when available.  Baruch Gouty,  M.D.

## 2016-11-10 NOTE — Anesthesia Post-op Follow-up Note (Cosign Needed)
Anesthesia QCDR form completed.        

## 2016-11-10 NOTE — Discharge Instructions (Signed)

## 2016-11-10 NOTE — OR Nursing (Signed)
Dr. Ronelle Nigh notified of UDS results via tele/ascom @ [phone] - advises ok.

## 2016-11-10 NOTE — H&P (View-Only) (Signed)
10:08 PM   Tara Cochran 1961-04-25 [id number]  Referring provider: Dr. Gwen Pounds  CC: Left ureteral stone/UTI  HPI: The patient is a 56 year old diabetic female with no previous genitourinary history who presented to the hospital after being diagnosed with 2 distal left UVJ stones and a UTI for emergent evaluation. Currently, she complains of left flank pain, nausea, and vomiting. She also has had subjective fevers and chills. She has no history of nephrolithiasis. The CT scan ordered by her primary care provider showed 2 distal left calculi one 5 mm and the other 4 mm adjacent to each other in the left UVJ with subsequent hydroureteronephrosis and perinephric stranding. Her white blood cell count is 13.8. She was tachycardic in the emergency department. Her urinalysis was concerning for urinary tract infection.  PMH: Past Medical History:  Diagnosis Date  . Diabetes mellitus without complication ([place])   . Hypertension   . Polycythemia     Surgical History: Past Surgical History:  Procedure Laterality Date  . CESAREAN SECTION    . CHOLECYSTECTOMY    . COLONOSCOPY N/A 12/12/2014   Procedure: COLONOSCOPY;  Surgeon: Lucilla Lame, MD;  Location: [place];  Service: Gastroenterology;  Laterality: N/A;  . ESOPHAGOGASTRODUODENOSCOPY N/A 12/12/2014   Procedure: ESOPHAGOGASTRODUODENOSCOPY (EGD);  Surgeon: Lucilla Lame, MD;  Location: [place];  Service: Gastroenterology;  Laterality: N/A;  . GALLBLADDER SURGERY    . POLYPECTOMY  12/12/2014   Procedure: POLYPECTOMY;  Surgeon: Lucilla Lame, MD;  Location: [place];  Service: Gastroenterology;;     Allergies:  Allergies  Allergen Reactions  . Morphine Sulfate Itching    Family History: Family History  Problem Relation Age of Onset  . Stroke Mother   . Hypertension Mother   . Hypertension Father   . Stroke Father   . Heart disease Sister     Social History:  reports that she has been  smoking Cigarettes.  She has been smoking about 0.50 packs per day. She has never used smokeless tobacco. She reports that she drinks alcohol. She reports that she uses drugs, including Marijuana, about 2 times per week.  ROS: 12 point ROS otherwise negative                                        Physical Exam: Ht 4\' 10"  (1.473 m)   Wt 174 lb (78.9 kg)   BMI 36.37 kg/m   Constitutional:  Alert and oriented, No acute distress. HEENT: [place] AT, moist mucus membranes.  Trachea midline, no masses. Cardiovascular: No clubbing, cyanosis, or edema. Respiratory: Normal respiratory effort, no increased work of breathing. GI: Abdomen is soft, nontender, nondistended, no abdominal masses GU: Left CVA tenderness.  Skin: No rashes, bruises or suspicious lesions. Lymph: No cervical or inguinal adenopathy. Neurologic: Grossly intact, no focal deficits, moving all 4 extremities. Psychiatric: Normal mood and affect.  Laboratory Data: Lab Results  Component Value Date   WBC 13.8 (H) 10/28/2016   HGB 14.1 10/28/2016   HCT 41.0 10/28/2016   MCV 89.2 10/28/2016   PLT 254 10/28/2016    Lab Results  Component Value Date   CREATININE 1.01 (H) 10/28/2016    No results found for: PSA  No results found for: TESTOSTERONE  Lab Results  Component Value Date   HGBA1C 7.2 08/06/2016    Urinalysis    Component Value Date/Time   COLORURINE AMBER (  A) 10/28/2016 1846   APPEARANCEUR CLOUDY (A) 10/28/2016 1846   APPEARANCEUR Cloudy (A) 08/11/2015 1219   LABSPEC >1.046 (H) 10/28/2016 1846   LABSPEC 1.046 04/23/2014 2102   PHURINE 5.0 10/28/2016 1846   GLUCOSEU 150 (A) 10/28/2016 1846   GLUCOSEU Negative 04/23/2014 2102   HGBUR MODERATE (A) 10/28/2016 1846   BILIRUBINUR NEGATIVE 10/28/2016 1846   BILIRUBINUR MODERATE 10/28/2016 1342   BILIRUBINUR Negative 08/11/2015 1219   BILIRUBINUR Negative 04/23/2014 2102   KETONESUR 5 (A) 10/28/2016 1846   PROTEINUR 30 (A) 10/28/2016  1846   UROBILINOGEN 1.0 10/28/2016 1342   NITRITE POSITIVE (A) 10/28/2016 1846   LEUKOCYTESUR LARGE (A) 10/28/2016 1846   LEUKOCYTESUR 2+ (A) 08/11/2015 1219   LEUKOCYTESUR Negative 04/23/2014 2102    Pertinent Imaging: CT reviewed as above  Assessment & Plan:    1. Left UVJ stones x2 2. Left perinephric stranding 3. Leukocytosis 4. UTI 5. Possible sepsis I discussed with the patient that she has two distal left ureteral calculi with an underlying urinary tract infection and concern for impending sepsis. We discussed the need for emergent left ureteral stent placement via cystoscopy, left retrograde pyelogram, and left ureteral stent placement. She understands that this procedure is to unobstruct her left collecting system. She understands we will address her stone burden at a later date when her infection has cleared. We discussed the risks and benefits of this procedure. We also discussed the meed for postoperative urethral catheter for optimal drainage of her left upper tract. We'll plan at this time for emergent cystoscopy, left retrograde pyelogram, and left ureteral stent placement. All questions were answered. The patient is agreeable to proceeding.  Postoperatively, she will be admitted to the floor to treat her sepsis. She will need to continue antibiotics pending culture and sensitivity results. We'll plan for definitive stone management in a few weeks once her infection has resolved.  [name], MD  [facility] [address] [place], [place] [address] [phone]

## 2016-11-10 NOTE — Anesthesia Procedure Notes (Signed)
Procedure Name: Intubation Date/Time: 11/10/2016 9:14 AM Performed by: Jonna Clark Pre-anesthesia Checklist: Patient identified, Patient being monitored, Timeout performed, Emergency Drugs available and Suction available Patient Re-evaluated:Patient Re-evaluated prior to inductionOxygen Delivery Method: Circle system utilized Preoxygenation: Pre-oxygenation with 100% oxygen Intubation Type: IV induction Ventilation: Mask ventilation without difficulty Laryngoscope Size: Miller and 2 Grade View: Grade I Tube type: Oral Tube size: 6.5 mm Number of attempts: 1 Airway Equipment and Method: Stylet Placement Confirmation: ETT inserted through vocal cords under direct vision,  positive ETCO2 and breath sounds checked- equal and bilateral Secured at: 21 cm Tube secured with: Tape Dental Injury: Teeth and Oropharynx as per pre-operative assessment

## 2016-11-10 NOTE — Anesthesia Preprocedure Evaluation (Signed)
Anesthesia Evaluation  Patient identified by MRN, date of birth, ID band Patient awake    Reviewed: Allergy & Precautions, NPO status , Patient's Chart, lab work & pertinent test results  History of Anesthesia Complications (+) history of anesthetic complications (sore throat)  Airway Mallampati: III       Dental  (+) Upper Dentures, Chipped, Poor Dentition, Missing   Pulmonary asthma , COPD,  COPD inhaler, Current Smoker,           Cardiovascular hypertension, Pt. on medications      Neuro/Psych Depression    GI/Hepatic hiatal hernia, GERD  Medicated and Poorly Controlled,(+) Hepatitis -, C  Endo/Other  diabetes, Type 2, Oral Hypoglycemic Agents  Renal/GU Renal disease     Musculoskeletal   Abdominal   Peds  Hematology  (+) anemia ,   Anesthesia Other Findings   Reproductive/Obstetrics                             Anesthesia Physical Anesthesia Plan  ASA: III  Anesthesia Plan: General   Post-op Pain Management:    Induction: Intravenous  Airway Management Planned: Oral ETT  Additional Equipment:   Intra-op Plan:   Post-operative Plan:   Informed Consent: I have reviewed the patients History and Physical, chart, labs and discussed the procedure including the risks, benefits and alternatives for the proposed anesthesia with the patient or authorized representative who has indicated his/her understanding and acceptance.     Plan Discussed with:   Anesthesia Plan Comments:         Anesthesia Quick Evaluation

## 2016-11-10 NOTE — Anesthesia Postprocedure Evaluation (Signed)
Anesthesia Post Note  Patient: Tara Cochran  Procedure(s) Performed: Procedure(s) (LRB): URETEROSCOPY (Left) CYSTOSCOPY WITH STENT REPLACEMENT (Left) STONE EXTRACTION WITH BASKET (Left)  Patient location during evaluation: PACU Anesthesia Type: General Level of consciousness: awake and alert Pain management: pain level controlled Vital Signs Assessment: post-procedure vital signs reviewed and stable Respiratory status: spontaneous breathing and respiratory function stable Cardiovascular status: stable Anesthetic complications: no     Last Vitals:  Vitals:   11/10/16 0743 11/10/16 0955  BP: 122/73 (!) 132/42  Pulse: 84 88  Resp: 16 (!) 21  Temp: 36.7 C 36.6 C    Last Pain:  Vitals:   11/10/16 0743  TempSrc: Oral  PainSc: 5                  Erendira Crabtree K

## 2016-11-10 NOTE — Interval H&P Note (Signed)
History and Physical Interval Note:  11/10/2016 7:47 AM  Tara Cochran  has presented today for surgery, with the diagnosis of left ureteral stent   The various methods of treatment have been discussed with the patient and family. After consideration of risks, benefits and other options for treatment, the patient has consented to  Procedure(s): URETEROSCOPY WITH HOLMIUM LASER LITHOTRIPSY (Left) CYSTOSCOPY WITH STENT REPLACEMENT (Left) as a surgical intervention .  The patient's history has been reviewed, patient examined, no change in status, stable for surgery.  I have reviewed the patient's chart and labs.  Questions were answered to the patient's satisfaction.    Presents for left URS after being previously temporized with left ureteral stent for proteus UTI. Culture is now negative Discussed risks/benefits indication of left URS/laser litho. All questions answered.  RRR Lungs clear  [name]

## 2016-11-11 ENCOUNTER — Telehealth: Payer: Self-pay | Admitting: Family Medicine

## 2016-11-11 ENCOUNTER — Telehealth: Payer: Self-pay

## 2016-11-11 ENCOUNTER — Other Ambulatory Visit: Payer: Self-pay

## 2016-11-11 DIAGNOSIS — B379 Candidiasis, unspecified: Secondary | ICD-10-CM

## 2016-11-11 DIAGNOSIS — T3695XA Adverse effect of unspecified systemic antibiotic, initial encounter: Principal | ICD-10-CM

## 2016-11-11 DIAGNOSIS — K580 Irritable bowel syndrome with diarrhea: Secondary | ICD-10-CM

## 2016-11-11 MED ORDER — ELUXADOLINE 75 MG PO TABS: 75.0000 mg | ORAL_TABLET | Freq: Two times a day (BID) | ORAL | 5 refills | Status: DC

## 2016-11-11 NOTE — Telephone Encounter (Signed)
Pt called stating she is urinating q13min and is not able to get any sleep since having second surgery with stent placed. Per Dr. Pilar Jarvis pt can have myrbetriq 25mg  samples. Made pt aware can come pick up samples. Pt requested medication be sent to her pharmacy. Made pt aware medication is very exspensive but can have the samples. Pt declined to come pick up samples.

## 2016-11-11 NOTE — Telephone Encounter (Signed)
Pt was seen on Tuesday and states you where going to give her a prescription for diflucan, however when she went to the pharmacy it was not there. Uses [facility].

## 2016-11-11 NOTE — Telephone Encounter (Signed)
LVM for pt pick-up of Rx.

## 2016-11-12 DIAGNOSIS — B379 Candidiasis, unspecified: Secondary | ICD-10-CM | POA: Insufficient documentation

## 2016-11-12 DIAGNOSIS — T3695XA Adverse effect of unspecified systemic antibiotic, initial encounter: Principal | ICD-10-CM

## 2016-11-12 MED ORDER — FLUCONAZOLE 150 MG PO TABS: 150.0000 mg | ORAL_TABLET | Freq: Once | ORAL | 0 refills | Status: AC

## 2016-11-12 NOTE — Telephone Encounter (Signed)
Patient believes she has vaginal yeast infection, has vaginal itching, could also be related to Jardiance side effects. We will prescribe Diflucan and send to pharmacy

## 2016-11-12 NOTE — Telephone Encounter (Signed)
Pt was seen on Tuesday and states you where going to give her a prescription for diflucan, however when she went to the pharmacy it was not there. Uses [facility].

## 2016-11-15 ENCOUNTER — Other Ambulatory Visit: Payer: Self-pay

## 2016-11-18 LAB — STONE ANALYSIS
AMMONIUM ACID URATE: 15 %
CA OXALATE, DIHYDRATE: 2 %
Ca Oxalate,Monohydr.: 3 %
Ca phos cry stone ql IR: 50 %
Magnesium Ammon Phos: 30 %
STONE WEIGHT KSTONE: 1 mg

## 2016-11-24 ENCOUNTER — Ambulatory Visit (INDEPENDENT_AMBULATORY_CARE_PROVIDER_SITE_OTHER): Payer: Medicare HMO | Admitting: Urology

## 2016-11-24 ENCOUNTER — Encounter: Payer: Self-pay | Admitting: Urology

## 2016-11-24 VITALS — BP 143/84 | HR 93 | Ht <= 58 in | Wt 168.7 lb

## 2016-11-24 DIAGNOSIS — N200 Calculus of kidney: Secondary | ICD-10-CM

## 2016-11-24 LAB — URINALYSIS, COMPLETE
BILIRUBIN UA: NEGATIVE
KETONES UA: NEGATIVE
NITRITE UA: NEGATIVE
Specific Gravity, UA: 1.02 (ref 1.005–1.030)
UUROB: 1 mg/dL (ref 0.2–1.0)
pH, UA: 6 (ref 5.0–7.5)

## 2016-11-24 LAB — MICROSCOPIC EXAMINATION
RBC, UA: >30 /hpf — ABNORMAL HIGH (ref 0–?)
WBC, UA: NONE SEEN /hpf (ref 0–?)

## 2016-11-24 MED ORDER — LIDOCAINE HCL 2 % EX GEL
1.0000 "application " | Freq: Once | CUTANEOUS | Status: AC
  Administered 2016-11-24: 1 via URETHRAL

## 2016-11-24 MED ORDER — CIPROFLOXACIN HCL 500 MG PO TABS
500.0000 mg | ORAL_TABLET | Freq: Once | ORAL | Status: AC
  Administered 2016-11-24: 500 mg via ORAL

## 2016-11-24 NOTE — Progress Notes (Signed)
   11/24/16  CC:  Chief Complaint  Patient presents with  . Cysto Stent Removal    HPI: The patient is a 56 year old female who presents today for left ureteral stent removal after left ureteroscopy for 5 mm calculus. Her stent was initially placed in the setting of a Proteus UTI. Her stone was definitely managed after a negative urine culture appropriate antibiotic therapy.  There were no vitals taken for this visit. NED. A&Ox3.   No respiratory distress   Abd soft, NT, ND Normal external genitalia with patent urethral meatus  Cystoscopy Procedure Note  Patient identification was confirmed, informed consent was obtained, and patient was prepped using Betadine solution.  Lidocaine jelly was administered per urethral meatus.    Preoperative abx where received prior to procedure.    Procedure: - Flexible cystoscope introduced, without any difficulty.   - Thorough search of the bladder revealed:    normal urethral meatus Left ureteral stent removed intact with flexible graspers per meatus   - Ureteral orifices were normal in position and appearance.  Post-Procedure: - Patient tolerated the procedure well  Assessment/ Plan:  1. Left ureteral stone Follow-up in 1 month for renal ultrasound. We'll discuss stone analysis at that time when it's available.  [name], MD

## 2016-12-22 ENCOUNTER — Ambulatory Visit
Admission: RE | Admit: 2016-12-22 | Discharge: 2016-12-22 | Disposition: A | Discharge status: Home / Self Care | Payer: Medicare HMO | Source: Ambulatory Visit | Attending: Urology | Admitting: Urology

## 2016-12-22 DIAGNOSIS — N200 Calculus of kidney: Secondary | ICD-10-CM | POA: Insufficient documentation

## 2016-12-24 ENCOUNTER — Ambulatory Visit: Payer: Medicare HMO | Admitting: Urology

## 2016-12-24 ENCOUNTER — Encounter: Payer: Self-pay | Admitting: Urology

## 2016-12-24 VITALS — BP 136/84 | HR 85 | Ht <= 58 in | Wt 168.4 lb

## 2016-12-24 DIAGNOSIS — N200 Calculus of kidney: Secondary | ICD-10-CM

## 2016-12-24 NOTE — Progress Notes (Signed)
12/24/2016 1:43 PM   Tara Cochran 1960-08-24 [id number]  Referring provider: Roselee Nova, MD [address] [place], [place] [address]  Chief Complaint  Patient presents with  . Follow-up    HPI: The patient is a 56 year old female who presents today for follow-up of posterior instrumentation renal ultrasound after left ureteroscopy for 5 mm calculus. Her stent was initially placed in the setting of a Proteus UTI. Her stone was definitely managed after a negative urine culture appropriate antibiotic therapy.    Her stone was 50% calcium phosphate, 30% magnesium ammonium phosphate, 15% ammonium acid urate, 2% calcium oxalate dihydrate, and 3% calcium oxalate monohydrate.  Renal ultrasound shows no evidence of hydronephrosis postoperatively.   PMH: Past Medical History:  Diagnosis Date  . Anemia    polycythemia  . Arthritis    hips/ knees/spine  . COPD (chronic obstructive pulmonary disease) ([place])   . Diabetes mellitus without complication ([place])   . GERD (gastroesophageal reflux disease)    food comes up when bends over  . Hepatitis    h/o tx x 3  . History of hiatal hernia   . History of kidney stones   . Hypertension   . Irritable behavior    gets mad easily  . Polycythemia   . Renal disorder     Surgical History: Past Surgical History:  Procedure Laterality Date  . CESAREAN SECTION    . CHOLECYSTECTOMY    . COLONOSCOPY N/A 12/12/2014   Procedure: COLONOSCOPY;  Surgeon: Lucilla Lame, MD;  Location: [facility];  Service: Gastroenterology;  Laterality: N/A;  . CYSTOSCOPY W/ URETERAL STENT PLACEMENT Left 11/10/2016   Procedure: CYSTOSCOPY WITH STENT REPLACEMENT;  Surgeon: [name], MD;  Location: [facility];  Service: Urology;  Laterality: Left;  . CYSTOSCOPY WITH RETROGRADE PYELOGRAM, URETEROSCOPY AND STENT PLACEMENT Left 10/28/2016   Procedure: CYSTOSCOPY WITH attempted RETROGRADE PYELOGRAM, URETEROSCOPY AND STENT PLACEMENT  left;  Surgeon: [name], MD;  Location: [facility];  Service: Urology;  Laterality: Left;  . ESOPHAGOGASTRODUODENOSCOPY N/A 12/12/2014   Procedure: ESOPHAGOGASTRODUODENOSCOPY (EGD);  Surgeon: Lucilla Lame, MD;  Location: [facility];  Service: Gastroenterology;  Laterality: N/A;  . GALLBLADDER SURGERY    . POLYPECTOMY  12/12/2014   Procedure: POLYPECTOMY;  Surgeon: Lucilla Lame, MD;  Location: [facility];  Service: Gastroenterology;;  . STONE EXTRACTION WITH BASKET Left 11/10/2016   Procedure: STONE EXTRACTION WITH BASKET;  Surgeon: [name], MD;  Location: [facility];  Service: Urology;  Laterality: Left;  . URETEROSCOPY Left 11/10/2016   Procedure: URETEROSCOPY;  Surgeon: [name], MD;  Location: [facility];  Service: Urology;  Laterality: Left;    Home Medications:  Allergies as of 12/24/2016      Reactions   Morphine Sulfate Itching      Medication List       Accurate as of 12/24/16  1:43 PM. Always use your most recent med list.          ACCU-CHEK SMARTVIEW test strip Generic drug:  glucose blood as directed.   albuterol 108 (90 Base) MCG/ACT inhaler Commonly known as:  PROVENTIL HFA;VENTOLIN HFA Inhale 2 puffs into the lungs every 6 (six) hours as needed for wheezing or shortness of breath.   Bladder Control Pads Ex Absorb Misc daily.   budesonide-formoterol 160-4.5 MCG/ACT inhaler Commonly known as:  SYMBICORT Inhale 2 puffs into the lungs 2 (two) times daily.   buPROPion 300 MG 24 hr tablet Commonly known  as:  WELLBUTRIN XL Take 1 tablet (300 mg total) by mouth daily.   buPROPion 150 MG 24 hr tablet Commonly known as:  WELLBUTRIN XL Take 1 tablet (150 mg total) by mouth daily.   Eluxadoline 75 MG Tabs Commonly known as:  VIBERZI Take 75 mg by mouth 2 (two) times daily.   empagliflozin 10 MG Tabs tablet Commonly known as:  JARDIANCE Take 10 mg by mouth daily.   glipiZIDE 5 MG tablet Commonly known as:   GLUCOTROL Take 1 tablet (5 mg total) by mouth daily before breakfast.   losartan 100 MG tablet Commonly known as:  COZAAR TAKE 1 TABLET BY MOUTH DAILY   rosuvastatin 10 MG tablet Commonly known as:  CRESTOR Take 1 tablet (10 mg total) by mouth daily.   WOMENS MULTIVITAMIN PLUS PO Take 1 tablet by mouth daily.       Allergies:  Allergies  Allergen Reactions  . Morphine Sulfate Itching    Family History: Family History  Problem Relation Age of Onset  . Stroke Mother   . Hypertension Mother   . Hypertension Father   . Stroke Father   . Heart disease Sister     Social History:  reports that she has been smoking Cigarettes.  She has been smoking about 0.50 packs per day. She has never used smokeless tobacco. She reports that she uses drugs, including Marijuana, about 2 times per week. She reports that she does not drink alcohol.  ROS: UROLOGY Hard to postpone urination?: No Burning/pain with urination?: No Get up at night to urinate?: No Leakage of urine?: No Urine stream starts and stops?: No Trouble starting stream?: No Do you have to strain to urinate?: No Blood in urine?: No Urinary tract infection?: No Sexually transmitted disease?: No Injury to kidneys or bladder?: No Painful intercourse?: No Weak stream?: No Currently pregnant?: No Vaginal bleeding?: No Last menstrual period?: n  Gastrointestinal Nausea?: No Vomiting?: No Indigestion/heartburn?: No Diarrhea?: No Constipation?: No  Constitutional Fever: No Night sweats?: No Weight loss?: No Fatigue?: No  Skin Skin rash/lesions?: No Itching?: No  Eyes Blurred vision?: No Double vision?: No  Ears/Nose/Throat Sore throat?: No Sinus problems?: No  Hematologic/Lymphatic Swollen glands?: No Easy bruising?: No  Cardiovascular Leg swelling?: No Chest pain?: No  Respiratory Cough?: No Shortness of breath?: Yes  Endocrine Excessive thirst?: No  Musculoskeletal Back pain?: No Joint  pain?: No  Neurological Headaches?: No Dizziness?: No  Psychologic Depression?: No  Physical Exam: BP 136/84 (BP Location: Left Arm, Patient Position: Sitting, Cuff Size: Normal)   Pulse 85   Ht 4\' 10"  (1.473 m)   Wt 168 lb 6.4 oz (76.4 kg)   BMI 35.20 kg/m   Constitutional:  Alert and oriented, No acute distress. HEENT: [place] AT, moist mucus membranes.  Trachea midline, no masses. Cardiovascular: No clubbing, cyanosis, or edema. Respiratory: Normal respiratory effort, no increased work of breathing. GI: Abdomen is soft, nontender, nondistended, no abdominal masses GU: No CVA tenderness.  Skin: No rashes, bruises or suspicious lesions. Lymph: No cervical or inguinal adenopathy. Neurologic: Grossly intact, no focal deficits, moving all 4 extremities. Psychiatric: Normal mood and affect.  Laboratory Data: Lab Results  Component Value Date   WBC 10.5 10/30/2016   HGB 13.9 10/30/2016   HCT 40.4 10/30/2016   MCV 89.9 10/30/2016   PLT 280 10/30/2016    Lab Results  Component Value Date   CREATININE 0.52 10/30/2016    No results found for: PSA  No results found  for: TESTOSTERONE  Lab Results  Component Value Date   HGBA1C 6.8 (H) 10/29/2016    Urinalysis    Component Value Date/Time   COLORURINE YELLOW (A) 10/31/2016 0150   APPEARANCEUR Cloudy (A) 11/24/2016 1301   LABSPEC 1.013 10/31/2016 0150   LABSPEC 1.046 04/23/2014 2102   PHURINE 5.0 10/31/2016 0150   GLUCOSEU 3+ (A) 11/24/2016 1301   GLUCOSEU Negative 04/23/2014 2102   HGBUR LARGE (A) 10/31/2016 0150   BILIRUBINUR Negative 11/24/2016 1301   BILIRUBINUR Negative 04/23/2014 2102   KETONESUR 20 (A) 10/31/2016 0150   PROTEINUR 3+ (A) 11/24/2016 1301   PROTEINUR 100 (A) 10/31/2016 0150   UROBILINOGEN 1.0 10/28/2016 1342   NITRITE Negative 11/24/2016 1301   NITRITE NEGATIVE 10/31/2016 0150   LEUKOCYTESUR 1+ (A) 11/24/2016 1301   LEUKOCYTESUR Negative 04/23/2014 2102    Pertinent Imaging: Renal  ultrasound reviewed as above with no hydronephrosis.  Assessment & Plan:    1. History of nephrolithiasis I discussed the patient that she has evidence of struvite stone as part of her stone analysis which is likely tinnitus that started her for stone the first place. This is consistent with what her urine culture grew at the time of sepsis which was Proteus. I have advised her to drink more water and consume at least a gallon of water per day. As this is her first stone, I do not think that she needs any further workup. I also advised her to keep her diabetes under control and to avoid tea products. At this point, she is stone free and will follow-up as needed.  Return if symptoms worsen or fail to improve.  [name], MD  [facility] [address] [place], [place] [address] [phone]

## 2017-02-03 ENCOUNTER — Other Ambulatory Visit: Payer: Self-pay | Admitting: Family Medicine

## 2017-02-03 DIAGNOSIS — J42 Unspecified chronic bronchitis: Secondary | ICD-10-CM

## 2017-02-04 ENCOUNTER — Other Ambulatory Visit: Payer: Self-pay | Admitting: Gastroenterology

## 2017-02-04 NOTE — Telephone Encounter (Signed)
*  STAT* If patient is at the pharmacy, call can be transferred to refill team.   1. Which medications need to be refilled? (please list name of each medication and dose if known) Viberzi 75 mg  2. Which pharmacy/location (including street and city if local pharmacy) is medication to be sent to? [facility]  3. Do they need a 30 day or 90 day supply? 30 day

## 2017-02-07 ENCOUNTER — Other Ambulatory Visit: Payer: Self-pay

## 2017-02-07 DIAGNOSIS — K580 Irritable bowel syndrome with diarrhea: Secondary | ICD-10-CM

## 2017-02-07 MED ORDER — ELUXADOLINE 75 MG PO TABS: 75.0000 mg | ORAL_TABLET | Freq: Two times a day (BID) | ORAL | 5 refills | Status: DC

## 2017-02-08 NOTE — Telephone Encounter (Signed)
Rx for Viberzi 75mg  was sent to [facility] per pt request.

## 2017-02-09 ENCOUNTER — Ambulatory Visit: Payer: Medicare HMO | Admitting: Family Medicine

## 2017-02-22 ENCOUNTER — Ambulatory Visit (INDEPENDENT_AMBULATORY_CARE_PROVIDER_SITE_OTHER): Payer: Medicare HMO | Admitting: Family Medicine

## 2017-02-22 ENCOUNTER — Encounter: Payer: Self-pay | Admitting: Family Medicine

## 2017-02-22 VITALS — BP 112/78 | HR 99 | Temp 98.1°F | Resp 17 | Ht <= 58 in | Wt 162.7 lb

## 2017-02-22 DIAGNOSIS — E7800 Pure hypercholesterolemia, unspecified: Secondary | ICD-10-CM | POA: Diagnosis not present

## 2017-02-22 DIAGNOSIS — Z794 Long term (current) use of insulin: Secondary | ICD-10-CM | POA: Diagnosis not present

## 2017-02-22 DIAGNOSIS — J42 Unspecified chronic bronchitis: Secondary | ICD-10-CM

## 2017-02-22 DIAGNOSIS — F3341 Major depressive disorder, recurrent, in partial remission: Secondary | ICD-10-CM | POA: Diagnosis not present

## 2017-02-22 DIAGNOSIS — E119 Type 2 diabetes mellitus without complications: Secondary | ICD-10-CM | POA: Diagnosis not present

## 2017-02-22 LAB — GLUCOSE, POCT (MANUAL RESULT ENTRY): POC GLUCOSE: 77 mg/dL (ref 70–99)

## 2017-02-22 LAB — POCT GLYCOSYLATED HEMOGLOBIN (HGB A1C): HEMOGLOBIN A1C: 6.3

## 2017-02-22 MED ORDER — BUDESONIDE-FORMOTEROL FUMARATE 160-4.5 MCG/ACT IN AERO: INHALATION_SPRAY | RESPIRATORY_TRACT | 2 refills | Status: DC

## 2017-02-22 MED ORDER — ALBUTEROL SULFATE HFA 108 (90 BASE) MCG/ACT IN AERS: 2.0000 | INHALATION_SPRAY | Freq: Four times a day (QID) | RESPIRATORY_TRACT | 2 refills | Status: DC | PRN

## 2017-02-22 MED ORDER — PAROXETINE HCL ER 25 MG PO TB24: 25.0000 mg | ORAL_TABLET | Freq: Every day | ORAL | 0 refills | Status: DC

## 2017-02-22 MED ORDER — PAROXETINE HCL ER 37.5 MG PO TB24: 37.5000 mg | ORAL_TABLET | Freq: Every day | ORAL | 2 refills | Status: DC

## 2017-02-22 MED ORDER — BUPROPION HCL ER (XL) 300 MG PO TB24: 300.0000 mg | ORAL_TABLET | Freq: Every day | ORAL | 0 refills | Status: DC

## 2017-02-22 NOTE — Progress Notes (Signed)
Name: Tara Cochran   MRN: [id number]    DOB: 30-Aug-1960   Date:02/22/2017       Progress Note  Subjective  Chief Complaint  Chief Complaint  Patient presents with  . Follow-up    3 mo / DM  . Diabetes  . Hyperlipidemia    Diabetes  She presents for her follow-up diabetic visit. She has type 2 diabetes mellitus. Her disease course has been stable. Pertinent negatives for hypoglycemia include no headaches. Associated symptoms include fatigue. Pertinent negatives for diabetes include no blurred vision, no chest pain, no foot paresthesias, no polydipsia and no polyuria. Pertinent negatives for diabetic complications include no CVA or heart disease. Current diabetic treatment includes oral agent (dual therapy). Her weight is decreasing steadily. She is following a diabetic and generally healthy diet. She monitors blood glucose at home 1-2 x per day. Her breakfast blood glucose range is generally 110-130 mg/dl. An ACE inhibitor/angiotensin II receptor blocker is being taken.  Hyperlipidemia  This is a chronic problem. The problem is uncontrolled. Recent lipid tests were reviewed and are normal. Pertinent negatives include no chest pain, leg pain, myalgias or shortness of breath. Current antihyperlipidemic treatment includes statins.  Hypertension  This is a chronic problem. The problem is unchanged. The problem is controlled. Pertinent negatives include no blurred vision, chest pain, headaches, palpitations or shortness of breath. Past treatments include angiotensin blockers. There is no history of kidney disease, CAD/MI or CVA.  Depression       The patient presents with depression.  This is a recurrent problem.  The onset quality is gradual.   The problem has been gradually worsening since onset.  Associated symptoms include fatigue, irritable, decreased interest, body aches, sad and suicidal ideas.  Associated symptoms include no helplessness, no hopelessness, no myalgias and no headaches.   Past treatments include SSRIs - Selective serotonin reuptake inhibitors (Wellbutrin XL 300 mg daily).  Compliance with treatment is good.  Previous treatment provided no relief relief.  Past medical history includes depression.     Past Medical History:  Diagnosis Date  . Anemia    polycythemia  . Arthritis    hips/ knees/spine  . COPD (chronic obstructive pulmonary disease) ([place])   . Diabetes mellitus without complication ([place])   . GERD (gastroesophageal reflux disease)    food comes up when bends over  . Hepatitis    h/o tx x 3  . History of hiatal hernia   . History of kidney stones   . Hypertension   . Irritable behavior    gets mad easily  . Polycythemia   . Renal disorder     Past Surgical History:  Procedure Laterality Date  . CESAREAN SECTION    . CHOLECYSTECTOMY    . COLONOSCOPY N/A 12/12/2014   Procedure: COLONOSCOPY;  Surgeon: Lucilla Lame, MD;  Location: [facility];  Service: Gastroenterology;  Laterality: N/A;  . CYSTOSCOPY W/ URETERAL STENT PLACEMENT Left 11/10/2016   Procedure: CYSTOSCOPY WITH STENT REPLACEMENT;  Surgeon: [name], MD;  Location: [facility];  Service: Urology;  Laterality: Left;  . CYSTOSCOPY WITH RETROGRADE PYELOGRAM, URETEROSCOPY AND STENT PLACEMENT Left 10/28/2016   Procedure: CYSTOSCOPY WITH attempted RETROGRADE PYELOGRAM, URETEROSCOPY AND STENT PLACEMENT left;  Surgeon: [name], MD;  Location: [facility];  Service: Urology;  Laterality: Left;  . ESOPHAGOGASTRODUODENOSCOPY N/A 12/12/2014   Procedure: ESOPHAGOGASTRODUODENOSCOPY (EGD);  Surgeon: Lucilla Lame, MD;  Location: [facility];  Service: Gastroenterology;  Laterality: N/A;  . GALLBLADDER SURGERY    .  POLYPECTOMY  12/12/2014   Procedure: POLYPECTOMY;  Surgeon: Lucilla Lame, MD;  Location: [facility];  Service: Gastroenterology;;  . STONE EXTRACTION WITH BASKET Left 11/10/2016   Procedure: STONE EXTRACTION WITH BASKET;  Surgeon: [name], MD;   Location: [facility];  Service: Urology;  Laterality: Left;  . URETEROSCOPY Left 11/10/2016   Procedure: URETEROSCOPY;  Surgeon: [name], MD;  Location: [facility];  Service: Urology;  Laterality: Left;    Family History  Problem Relation Age of Onset  . Stroke Mother   . Hypertension Mother   . Hypertension Father   . Stroke Father   . Heart disease Sister     Social History   Social History  . Marital status: Divorced    Spouse name: N/A  . Number of children: N/A  . Years of education: N/A   Occupational History  . Not on file.   Social History Main Topics  . Smoking status: Current Every Day Smoker    Packs/day: 0.50    Types: Cigarettes  . Smokeless tobacco: Never Used  . Alcohol use No  . Drug use: Yes    Frequency: 2.0 times per week    Types: Marijuana  . Sexual activity: Not on file   Other Topics Concern  . Not on file   Social History Narrative  . No narrative on file     Current Outpatient Prescriptions:  .  ACCU-CHEK SMARTVIEW test strip, as directed., Disp: , Rfl:  .  albuterol (PROVENTIL HFA;VENTOLIN HFA) 108 (90 Base) MCG/ACT inhaler, Inhale 2 puffs into the lungs every 6 (six) hours as needed for wheezing or shortness of breath., Disp: 1 Inhaler, Rfl: 2 .  ASSURE COMFORT LANCETS 30G MISC, , Disp: , Rfl:  .  Blood Glucose Calibration (ACCU-CHEK AVIVA) SOLN, , Disp: , Rfl:  .  Blood Glucose Monitoring Suppl (ACCU-CHEK AVIVA PLUS) w/Device KIT, , Disp: , Rfl:  .  buPROPion (WELLBUTRIN XL) 300 MG 24 hr tablet, Take 1 tablet (300 mg total) by mouth daily., Disp: 90 tablet, Rfl: 0 .  Eluxadoline (VIBERZI) 75 MG TABS, Take 75 mg by mouth 2 (two) times daily., Disp: 60 tablet, Rfl: 5 .  empagliflozin (JARDIANCE) 10 MG TABS tablet, Take 10 mg by mouth daily., Disp: 90 tablet, Rfl: 0 .  glipiZIDE (GLUCOTROL) 5 MG tablet, Take 1 tablet (5 mg total) by mouth daily before breakfast., Disp: 90 tablet, Rfl: 0 .  Incontinence Supply Disposable (BLADDER  CONTROL PADS EX ABSORB) MISC, daily., Disp: , Rfl:  .  Lancet Devices (ADJUSTABLE LANCING DEVICE) MISC, , Disp: , Rfl:  .  losartan (COZAAR) 100 MG tablet, TAKE 1 TABLET BY MOUTH DAILY, Disp: 90 tablet, Rfl: 0 .  Multiple Vitamins-Minerals (WOMENS MULTIVITAMIN PLUS PO), Take 1 tablet by mouth daily. , Disp: , Rfl:  .  rosuvastatin (CRESTOR) 10 MG tablet, Take 1 tablet (10 mg total) by mouth daily., Disp: 90 tablet, Rfl: 0 .  SYMBICORT 160-4.5 MCG/ACT inhaler, INHALE 2 PUFFS BY MOUTH INTO THE LUNGS 2TIMES DAILY, Disp: 10.2 g, Rfl: 0 .  buPROPion (WELLBUTRIN XL) 150 MG 24 hr tablet, Take 1 tablet (150 mg total) by mouth daily., Disp: 90 tablet, Rfl: 0  Allergies  Allergen Reactions  . Morphine Sulfate Itching     Review of Systems  Constitutional: Positive for fatigue.  Eyes: Negative for blurred vision.  Respiratory: Negative for shortness of breath.   Cardiovascular: Negative for chest pain and palpitations.  Musculoskeletal: Negative for myalgias.  Neurological: Negative for headaches.  Endo/Heme/Allergies: Negative for polydipsia.  Psychiatric/Behavioral: Positive for depression and suicidal ideas.    Objective  Vitals:   02/22/17 1519  BP: 112/78  Pulse: 99  Resp: 17  Temp: 98.1 F (36.7 C)  TempSrc: Oral  SpO2: 98%  Weight: 162 lb 11.2 oz (73.8 kg)  Height: 4' 10"  (1.473 m)    Physical Exam  Constitutional: She is oriented to person, place, and time and well-developed, well-nourished, and in no distress. She is irritable.  HENT:  Head: Normocephalic and atraumatic.  Cardiovascular: Normal rate, regular rhythm and normal heart sounds.   No murmur heard. Pulmonary/Chest: Effort normal and breath sounds normal. She has no wheezes.  Abdominal: Soft. Bowel sounds are normal. There is no tenderness.  Musculoskeletal: She exhibits no edema.  Neurological: She is alert and oriented to person, place, and time.  Psychiatric: Mood, memory, affect and judgment normal.   Nursing note and vitals reviewed.      Recent Results (from the past 2160 hour(s))  POCT Glucose (CBG)     Status: Normal   Collection Time: 02/22/17  3:24 PM  Result Value Ref Range   POC Glucose 77 70 - 99 mg/dl  POCT HgB A1C     Status: Normal   Collection Time: 02/22/17  3:25 PM  Result Value Ref Range   Hemoglobin A1C 6.3      Assessment & Plan  1. Controlled type 2 diabetes mellitus without complication, with long-term current use of insulin (HCC)   A1c 6.3%, well-controlled diabetes, no change in topical therapy - POCT HgB A1C - POCT Glucose (CBG)  2. Recurrent major depressive disorder, in partial remission (HCC) DC Wellbutrin by tapering off over 9 days, then start on Paxil CR with 25 mg for first week, followed by advancing to 37.5 mg until her follow-up in 6 weeks - PARoxetine (PAXIL-CR) 25 MG 24 hr tablet; Take 1 tablet (25 mg total) by mouth daily.  Dispense: 7 tablet; Refill: 0 - PARoxetine (PAXIL-CR) 37.5 MG 24 hr tablet; Take 1 tablet (37.5 mg total) by mouth daily.  Dispense: 21 tablet; Refill: 2 - buPROPion (WELLBUTRIN XL) 300 MG 24 hr tablet; Take 1 tablet (300 mg total) by mouth daily. Take 450 mg daily for 3 days, then 300 mg daily for 3 days, then 150 mg daily for 3 days, then STOP  Dispense: 20 tablet; Refill: 0  3. Chronic bronchitis, unspecified chronic bronchitis type (HCC)  - albuterol (PROVENTIL HFA;VENTOLIN HFA) 108 (90 Base) MCG/ACT inhaler; Inhale 2 puffs into the lungs every 6 (six) hours as needed for wheezing or shortness of breath.  Dispense: 3 Inhaler; Refill: 2 - budesonide-formoterol (SYMBICORT) 160-4.5 MCG/ACT inhaler; INHALE 2 PUFFS BY MOUTH INTO THE LUNGS 2TIMES DAILY  Dispense: 10.2 g; Refill: 2  4. Pure hypercholesterolemia On statin, obtain FLP and liver enzymes - Lipid panel - COMPLETE METABOLIC PANEL WITH GFR    Tara Cochran [facility] 02/22/2017 3:31 PM

## 2017-02-23 ENCOUNTER — Telehealth: Payer: Self-pay | Admitting: Family Medicine

## 2017-02-23 LAB — LIPID PANEL
Cholesterol: 155 mg/dL (ref <200)
HDL: 64 mg/dL (ref >50)
LDL CALC: 66 mg/dL (ref <100)
Total CHOL/HDL Ratio: 2.4 Ratio (ref <5.0)
Triglycerides: 125 mg/dL (ref <150)
VLDL: 25 mg/dL (ref <30)

## 2017-02-23 LAB — COMPLETE METABOLIC PANEL WITH GFR
ALT: 22 U/L (ref 6–29)
AST: 19 U/L (ref 10–35)
Albumin: 4.3 g/dL (ref 3.6–5.1)
Alkaline Phosphatase: 137 U/L — ABNORMAL HIGH (ref 33–130)
BUN: 13 mg/dL (ref 7–25)
CHLORIDE: 106 mmol/L (ref 98–110)
CO2: 24 mmol/L (ref 20–32)
CREATININE: 0.76 mg/dL (ref 0.50–1.05)
Calcium: 10.2 mg/dL (ref 8.6–10.4)
GFR, Est Non African American: 89 mL/min (ref >=60)
Glucose, Bld: 80 mg/dL (ref 65–99)
Potassium: 4.5 mmol/L (ref 3.5–5.3)
Sodium: 142 mmol/L (ref 135–146)
TOTAL PROTEIN: 7.2 g/dL (ref 6.1–8.1)
Total Bilirubin: 0.6 mg/dL (ref 0.2–1.2)

## 2017-02-23 NOTE — Telephone Encounter (Signed)
Requesting return call. Would like to discuss the medication that was prescribed on yesterday.  [phone]

## 2017-02-23 NOTE — Telephone Encounter (Signed)
Dr. Manuella Ghazi, Requesting return call. Would like to discuss the medication that was prescribed on yesterday.  [phone] please call patient

## 2017-02-24 NOTE — Telephone Encounter (Signed)
Tara Cochran had questions about the dosage of Paxil CR which was prescribed yesterday. She should start on Paxil 25 mg daily for 7 days then she should switch to 37.5 mg continue taking that until her next appointment with me. Patient verbalized agreement

## 2017-03-28 ENCOUNTER — Telehealth: Payer: Self-pay | Admitting: Family Medicine

## 2017-03-28 NOTE — Telephone Encounter (Signed)
We received paperwork from [employer] requesting a back brace. Spoke with patient because Dr Manuella Ghazi would like for patient to schedule an appointment to discuss back issues. Pt refused appointment, she informed me that she keep telling that company to stop sending the request over that she did not wish to have it. It was faxed back to the company stating just that and no appointment was made per patient request.

## 2017-05-10 ENCOUNTER — Other Ambulatory Visit: Payer: Self-pay | Admitting: Family Medicine

## 2017-05-10 DIAGNOSIS — F3341 Major depressive disorder, recurrent, in partial remission: Secondary | ICD-10-CM

## 2017-05-25 ENCOUNTER — Encounter: Payer: Self-pay | Admitting: Family Medicine

## 2017-05-25 ENCOUNTER — Ambulatory Visit: Payer: Medicare HMO | Admitting: Family Medicine

## 2017-05-25 VITALS — BP 118/76 | HR 68 | Temp 97.4°F | Resp 16 | Ht <= 58 in | Wt 164.2 lb

## 2017-05-25 DIAGNOSIS — Z23 Encounter for immunization: Secondary | ICD-10-CM | POA: Diagnosis not present

## 2017-05-25 DIAGNOSIS — E7800 Pure hypercholesterolemia, unspecified: Secondary | ICD-10-CM

## 2017-05-25 DIAGNOSIS — N3001 Acute cystitis with hematuria: Secondary | ICD-10-CM | POA: Diagnosis not present

## 2017-05-25 DIAGNOSIS — E119 Type 2 diabetes mellitus without complications: Secondary | ICD-10-CM

## 2017-05-25 LAB — GLUCOSE, POCT (MANUAL RESULT ENTRY): POC Glucose: 64 mg/dl — AB (ref 70–99)

## 2017-05-25 LAB — POCT URINALYSIS DIPSTICK
Bilirubin, UA: NEGATIVE
Glucose, UA: 2000
Ketones, UA: NEGATIVE
Nitrite, UA: NEGATIVE
PROTEIN UA: NEGATIVE
Spec Grav, UA: 1.02 (ref 1.010–1.025)
UROBILINOGEN UA: 0.2 U/dL
pH, UA: 5 (ref 5.0–8.0)

## 2017-05-25 LAB — POCT GLYCOSYLATED HEMOGLOBIN (HGB A1C): Hemoglobin A1C: 6

## 2017-05-25 MED ORDER — NITROFURANTOIN MONOHYD MACRO 100 MG PO CAPS: 100.0000 mg | ORAL_CAPSULE | Freq: Two times a day (BID) | ORAL | 0 refills | Status: AC

## 2017-05-25 MED ORDER — GLIPIZIDE 5 MG PO TABS: 2.5000 mg | ORAL_TABLET | Freq: Every day | ORAL | 0 refills | Status: DC

## 2017-05-25 NOTE — Progress Notes (Signed)
Name: Tara Cochran   MRN: [id number]    DOB: May 03, 1961   Date:05/25/2017       Progress Note  Subjective  Chief Complaint  Chief Complaint  Patient presents with  . Follow-up    3 months   . Urinary Tract Infection    frequent urination, foul order and urgency   . Diabetes    f/u  . Hyperlipidemia    f/u  . Medication Refill    Urinary Tract Infection   This is a new problem. The current episode started 1 to 4 weeks ago (2 weeks ago, started with onset of diarrhea.). The problem occurs every urination. The pain is at a severity of 0/10. The patient is experiencing no pain. There has been no fever. She is sexually active. There is a history of pyelonephritis. Associated symptoms include frequency and urgency. Pertinent negatives include no flank pain or sweats. She has tried nothing for the symptoms. Her past medical history is significant for kidney stones and recurrent UTIs.  Diabetes  She presents for her follow-up diabetic visit. She has type 2 diabetes mellitus. Her disease course has been stable. Pertinent negatives for hypoglycemia include no dizziness, headaches, speech difficulty or sweats. Pertinent negatives for diabetes include no chest pain, no foot paresthesias, no polydipsia and no polyuria. Symptoms are stable. Risk factors for coronary artery disease include diabetes mellitus and dyslipidemia. Current diabetic treatment includes oral agent (dual therapy). She is following a generally healthy diet. She rarely participates in exercise. She monitors blood glucose at home 1-2 x per day. Her breakfast blood glucose range is generally >200 mg/dl. An ACE inhibitor/angiotensin II receptor blocker is being taken. Eye exam is not current.  Hyperlipidemia  This is a chronic problem. The problem is controlled. Recent lipid tests were reviewed and are normal. Pertinent negatives include no chest pain, leg pain, myalgias or shortness of breath. Current antihyperlipidemic treatment  includes statins.      Past Medical History:  Diagnosis Date  . Anemia    polycythemia  . Arthritis    hips/ knees/spine  . COPD (chronic obstructive pulmonary disease) ([place])   . Diabetes mellitus without complication ([place])   . GERD (gastroesophageal reflux disease)    food comes up when bends over  . Hepatitis    h/o tx x 3  . History of hiatal hernia   . History of kidney stones   . Hypertension   . Irritable behavior    gets mad easily  . Polycythemia   . Renal disorder     Past Surgical History:  Procedure Laterality Date  . CESAREAN SECTION    . CHOLECYSTECTOMY    . COLONOSCOPY N/A 12/12/2014   Procedure: COLONOSCOPY;  Surgeon: Lucilla Lame, MD;  Location: [facility];  Service: Gastroenterology;  Laterality: N/A;  . CYSTOSCOPY W/ URETERAL STENT PLACEMENT Left 11/10/2016   Procedure: CYSTOSCOPY WITH STENT REPLACEMENT;  Surgeon: [name], MD;  Location: [facility];  Service: Urology;  Laterality: Left;  . CYSTOSCOPY WITH RETROGRADE PYELOGRAM, URETEROSCOPY AND STENT PLACEMENT Left 10/28/2016   Procedure: CYSTOSCOPY WITH attempted RETROGRADE PYELOGRAM, URETEROSCOPY AND STENT PLACEMENT left;  Surgeon: [name], MD;  Location: [facility];  Service: Urology;  Laterality: Left;  . ESOPHAGOGASTRODUODENOSCOPY N/A 12/12/2014   Procedure: ESOPHAGOGASTRODUODENOSCOPY (EGD);  Surgeon: Lucilla Lame, MD;  Location: [facility];  Service: Gastroenterology;  Laterality: N/A;  . GALLBLADDER SURGERY    . POLYPECTOMY  12/12/2014   Procedure: POLYPECTOMY;  Surgeon: Lucilla Lame,  MD;  Location: [facility];  Service: Gastroenterology;;  . STONE EXTRACTION WITH BASKET Left 11/10/2016   Procedure: STONE EXTRACTION WITH BASKET;  Surgeon: [name], MD;  Location: [facility];  Service: Urology;  Laterality: Left;  . URETEROSCOPY Left 11/10/2016   Procedure: URETEROSCOPY;  Surgeon: [name], MD;  Location: [facility];  Service: Urology;  Laterality:  Left;    Family History  Problem Relation Age of Onset  . Stroke Mother   . Hypertension Mother   . Hypertension Father   . Stroke Father   . Heart disease Sister     Social History   Socioeconomic History  . Marital status: Divorced    Spouse name: Not on file  . Number of children: Not on file  . Years of education: Not on file  . Highest education level: Not on file  Social Needs  . Financial resource strain: Not on file  . Food insecurity - worry: Not on file  . Food insecurity - inability: Not on file  . Transportation needs - medical: Not on file  . Transportation needs - non-medical: Not on file  Occupational History  . Not on file  Tobacco Use  . Smoking status: Current Every Day Smoker    Packs/day: 0.50    Types: Cigarettes  . Smokeless tobacco: Never Used  Substance and Sexual Activity  . Alcohol use: No    Alcohol/week: 0.0 - 0.6 oz  . Drug use: Yes    Frequency: 2.0 times per week    Types: Marijuana  . Sexual activity: Not on file  Other Topics Concern  . Not on file  Social History Narrative  . Not on file     Current Outpatient Medications:  .  ACCU-CHEK SMARTVIEW test strip, as directed., Disp: , Rfl:  .  albuterol (PROVENTIL HFA;VENTOLIN HFA) 108 (90 Base) MCG/ACT inhaler, Inhale 2 puffs into the lungs every 6 (six) hours as needed for wheezing or shortness of breath., Disp: 3 Inhaler, Rfl: 2 .  ASSURE COMFORT LANCETS 30G MISC, , Disp: , Rfl:  .  Blood Glucose Calibration (ACCU-CHEK AVIVA) SOLN, , Disp: , Rfl:  .  Blood Glucose Monitoring Suppl (ACCU-CHEK AVIVA PLUS) w/Device KIT, , Disp: , Rfl:  .  budesonide-formoterol (SYMBICORT) 160-4.5 MCG/ACT inhaler, INHALE 2 PUFFS BY MOUTH INTO THE LUNGS 2TIMES DAILY, Disp: 10.2 g, Rfl: 2 .  Eluxadoline (VIBERZI) 75 MG TABS, Take 75 mg by mouth 2 (two) times daily., Disp: 60 tablet, Rfl: 5 .  empagliflozin (JARDIANCE) 10 MG TABS tablet, Take 10 mg by mouth daily., Disp: 90 tablet, Rfl: 0 .  glipiZIDE  (GLUCOTROL) 5 MG tablet, Take 1 tablet (5 mg total) by mouth daily before breakfast., Disp: 90 tablet, Rfl: 0 .  Incontinence Supply Disposable (BLADDER CONTROL PADS EX ABSORB) MISC, daily., Disp: , Rfl:  .  Lancet Devices (ADJUSTABLE LANCING DEVICE) MISC, , Disp: , Rfl:  .  losartan (COZAAR) 100 MG tablet, TAKE 1 TABLET BY MOUTH DAILY, Disp: 90 tablet, Rfl: 0 .  Multiple Vitamins-Minerals (WOMENS MULTIVITAMIN PLUS PO), Take 1 tablet by mouth daily. , Disp: , Rfl:  .  PARoxetine (PAXIL-CR) 37.5 MG 24 hr tablet, TAKE 1 TABLET BY MOUTH ONCE A DAY, Disp: 30 tablet, Rfl: 0 .  rosuvastatin (CRESTOR) 10 MG tablet, Take 1 tablet (10 mg total) by mouth daily., Disp: 90 tablet, Rfl: 0 .  buPROPion (WELLBUTRIN XL) 300 MG 24 hr tablet, Take 1 tablet (300 mg total) by mouth  daily. Take 450 mg daily for 3 days, then 300 mg daily for 3 days, then 150 mg daily for 3 days, then STOP (Patient not taking: Reported on 05/25/2017), Disp: 20 tablet, Rfl: 0 .  PARoxetine (PAXIL-CR) 25 MG 24 hr tablet, Take 1 tablet (25 mg total) by mouth daily. (Patient not taking: Reported on 05/25/2017), Disp: 7 tablet, Rfl: 0  Allergies  Allergen Reactions  . Morphine Sulfate Itching     Review of Systems  Respiratory: Negative for shortness of breath.   Cardiovascular: Negative for chest pain.  Genitourinary: Positive for frequency and urgency. Negative for flank pain.  Musculoskeletal: Negative for myalgias.  Neurological: Negative for dizziness, speech difficulty and headaches.  Endo/Heme/Allergies: Negative for polydipsia.      Objective  Vitals:   05/25/17 1339  BP: 118/76  Pulse: 68  Resp: 16  Temp: (!) 97.4 F (36.3 C)  TempSrc: Oral  SpO2: 98%  Weight: 164 lb 3.2 oz (74.5 kg)  Height: 4' 10"  (1.473 m)    Physical Exam  Constitutional: She is oriented to person, place, and time and well-developed, well-nourished, and in no distress.  Cardiovascular: Normal rate, regular rhythm and normal heart sounds.   No murmur heard. Pulmonary/Chest: Effort normal and breath sounds normal. She has no wheezes.  Abdominal: Soft. Bowel sounds are normal. There is no tenderness. There is no CVA tenderness.  Neurological: She is alert and oriented to person, place, and time.  Psychiatric: Mood, memory, affect and judgment normal.  Nursing note and vitals reviewed.     Recent Results (from the past 2160 hour(s))  POCT Glucose (CBG)     Status: Abnormal   Collection Time: 05/25/17  1:43 PM  Result Value Ref Range   POC Glucose 64 (A) 70 - 99 mg/dl  POCT urinalysis dipstick     Status: Abnormal   Collection Time: 05/25/17  1:51 PM  Result Value Ref Range   Color, UA yellow    Clarity, UA clear    Glucose, UA 2,000     Comment: 2,000 or more    Bilirubin, UA neg    Ketones, UA neg    Spec Grav, UA 1.020 1.010 - 1.025   Blood, UA small    pH, UA 5.0 5.0 - 8.0   Protein, UA neg    Urobilinogen, UA 0.2 0.2 or 1.0 E.U./dL   Nitrite, UA neg    Leukocytes, UA Large (3+) (A) Negative  POCT HgB A1C     Status: Normal   Collection Time: 05/25/17  1:52 PM  Result Value Ref Range   Hemoglobin A1C 6.0      Assessment & Plan  1. Controlled type 2 diabetes mellitus without complication, without long-term current use of insulin (HCC) A1c 6.0%, well-controlled diabetes, will decrease glipizide to 2.5 mg to avoid hypoglycemic episodes - POCT HgB A1C - POCT Glucose (CBG) - glipiZIDE (GLUCOTROL) 5 MG tablet; Take 0.5 tablets (2.5 mg total) by mouth daily before breakfast.  Dispense: 45 tablet; Refill: 0  2. Needs flu shot  - Flu Vaccine QUAD 6+ mos PF IM (Fluarix Quad PF)  3. Acute cystitis with hematuria  - POCT urinalysis dipstick - Urinalysis, Routine w reflex microscopic - Urine Culture - nitrofurantoin, macrocrystal-monohydrate, (MACROBID) 100 MG capsule; Take 1 capsule (100 mg total) by mouth 2 (two) times daily for 7 days.  Dispense: 14 capsule; Refill: 0  4. Pure hypercholesterolemia  -  Lipid panel - COMPLETE METABOLIC PANEL WITH GFR  Juvenal Umar [facility] 05/25/2017 2:05 PM

## 2017-05-26 ENCOUNTER — Other Ambulatory Visit: Payer: Self-pay | Admitting: Family Medicine

## 2017-05-26 DIAGNOSIS — I10 Essential (primary) hypertension: Secondary | ICD-10-CM

## 2017-05-26 LAB — COMPLETE METABOLIC PANEL WITHOUT GFR
AG Ratio: 1.3 (calc) (ref 1.0–2.5)
ALT: 16 U/L (ref 6–29)
AST: 14 U/L (ref 10–35)
Albumin: 3.9 g/dL (ref 3.6–5.1)
Alkaline phosphatase (APISO): 125 U/L (ref 33–130)
BUN: 21 mg/dL (ref 7–25)
CO2: 28 mmol/L (ref 20–32)
Calcium: 9.7 mg/dL (ref 8.6–10.4)
Chloride: 106 mmol/L (ref 98–110)
Creat: 0.63 mg/dL (ref 0.50–1.05)
GFR, Est African American: 117 mL/min/1.73m2 (ref >=60)
GFR, Est Non African American: 101 mL/min/1.73m2 (ref >=60)
Globulin: 3 g/dL (ref 1.9–3.7)
Glucose, Bld: 90 mg/dL (ref 65–99)
Potassium: 4.1 mmol/L (ref 3.5–5.3)
Sodium: 143 mmol/L (ref 135–146)
Total Bilirubin: 0.5 mg/dL (ref 0.2–1.2)
Total Protein: 6.9 g/dL (ref 6.1–8.1)

## 2017-05-26 LAB — URINALYSIS, ROUTINE W REFLEX MICROSCOPIC
BACTERIA UA: NONE SEEN /HPF
BILIRUBIN URINE: NEGATIVE
Hyaline Cast: NONE SEEN /LPF
KETONES UR: NEGATIVE
NITRITE: NEGATIVE
PROTEIN: NEGATIVE
Specific Gravity, Urine: 1.015 (ref 1.001–1.03)
pH: 6 (ref 5.0–8.0)

## 2017-05-26 LAB — LIPID PANEL
Cholesterol: 202 mg/dL — ABNORMAL HIGH (ref <200)
HDL: 71 mg/dL (ref >50)
LDL Cholesterol (Calc): 107 mg/dL — ABNORMAL HIGH
Non-HDL Cholesterol (Calc): 131 mg/dL — ABNORMAL HIGH (ref <130)
Total CHOL/HDL Ratio: 2.8 (calc) (ref <5.0)
Triglycerides: 126 mg/dL (ref <150)

## 2017-05-28 LAB — URINE CULTURE
MICRO NUMBER: 81336887
SPECIMEN QUALITY: ADEQUATE

## 2017-05-31 ENCOUNTER — Other Ambulatory Visit: Payer: Self-pay

## 2017-05-31 DIAGNOSIS — E785 Hyperlipidemia, unspecified: Principal | ICD-10-CM

## 2017-05-31 DIAGNOSIS — E1169 Type 2 diabetes mellitus with other specified complication: Secondary | ICD-10-CM

## 2017-05-31 MED ORDER — ROSUVASTATIN CALCIUM 20 MG PO TABS: ORAL_TABLET | ORAL | 0 refills | Status: DC

## 2017-06-02 ENCOUNTER — Telehealth: Payer: Self-pay

## 2017-06-02 NOTE — Telephone Encounter (Signed)
Pt stated she received my message and stopped the antibiotic and will get her new Rx today.

## 2017-06-02 NOTE — Telephone Encounter (Signed)
Copied from [place] [id number]. Topic: Quick Communication - Lab Results >> May 31, 2017 12:39 PM Mart Piggs, CMA wrote: Left a message regarding pt recent lab results. Also sent a new Rx request by Dr.shah of her cholesterol meds; the dosage is now 20 mg at bedtime daily. >> May 31, 2017 12:48 PM Conception Chancy, NT wrote: Patient is calling back to get her lab results, she missed call from [facility]

## 2017-06-07 ENCOUNTER — Ambulatory Visit: Payer: Medicare HMO | Admitting: Gastroenterology

## 2017-06-14 ENCOUNTER — Other Ambulatory Visit: Payer: Self-pay | Admitting: Family Medicine

## 2017-06-14 DIAGNOSIS — F3341 Major depressive disorder, recurrent, in partial remission: Secondary | ICD-10-CM

## 2017-06-14 DIAGNOSIS — J42 Unspecified chronic bronchitis: Secondary | ICD-10-CM

## 2017-07-12 ENCOUNTER — Encounter: Payer: Self-pay | Admitting: Gastroenterology

## 2017-07-12 ENCOUNTER — Ambulatory Visit: Payer: Medicare HMO | Admitting: Gastroenterology

## 2017-08-04 ENCOUNTER — Other Ambulatory Visit: Payer: Self-pay | Admitting: Family Medicine

## 2017-08-04 DIAGNOSIS — F3341 Major depressive disorder, recurrent, in partial remission: Secondary | ICD-10-CM

## 2017-08-25 ENCOUNTER — Encounter: Payer: Self-pay | Admitting: Family Medicine

## 2017-08-25 ENCOUNTER — Ambulatory Visit (INDEPENDENT_AMBULATORY_CARE_PROVIDER_SITE_OTHER): Payer: Medicare HMO | Admitting: Family Medicine

## 2017-08-25 VITALS — BP 142/82 | HR 93 | Temp 98.3°F | Resp 16 | Wt 162.6 lb

## 2017-08-25 DIAGNOSIS — Z124 Encounter for screening for malignant neoplasm of cervix: Secondary | ICD-10-CM

## 2017-08-25 DIAGNOSIS — G4700 Insomnia, unspecified: Secondary | ICD-10-CM

## 2017-08-25 DIAGNOSIS — R0989 Other specified symptoms and signs involving the circulatory and respiratory systems: Secondary | ICD-10-CM

## 2017-08-25 DIAGNOSIS — R3915 Urgency of urination: Secondary | ICD-10-CM

## 2017-08-25 DIAGNOSIS — E785 Hyperlipidemia, unspecified: Secondary | ICD-10-CM | POA: Diagnosis not present

## 2017-08-25 DIAGNOSIS — I10 Essential (primary) hypertension: Secondary | ICD-10-CM | POA: Diagnosis not present

## 2017-08-25 DIAGNOSIS — E119 Type 2 diabetes mellitus without complications: Secondary | ICD-10-CM | POA: Diagnosis not present

## 2017-08-25 DIAGNOSIS — Z711 Person with feared health complaint in whom no diagnosis is made: Secondary | ICD-10-CM | POA: Diagnosis not present

## 2017-08-25 DIAGNOSIS — E1169 Type 2 diabetes mellitus with other specified complication: Secondary | ICD-10-CM | POA: Diagnosis not present

## 2017-08-25 DIAGNOSIS — B182 Chronic viral hepatitis C: Secondary | ICD-10-CM | POA: Diagnosis not present

## 2017-08-25 MED ORDER — TRAZODONE HCL 50 MG PO TABS: 25.0000 mg | ORAL_TABLET | Freq: Every evening | ORAL | 0 refills | Status: DC | PRN

## 2017-08-25 NOTE — Assessment & Plan Note (Signed)
Check urine today, refer to urologist to see if emptying problem

## 2017-08-25 NOTE — Patient Instructions (Signed)
Please do see your eye doctor regularly, and have your eyes examined every year (or more often per his or her recommendation) Check your feet every night and let me know right away of any sores, infections, numbness, etc. Try to limit sweets, white bread, white rice, white potatoes It is okay with me for you to not check your fingerstick blood sugars (per [employer] of Endocrinology Best Practices), unless you are interested and feel it would be helpful for you Try to limit saturated fats in your diet (bologna, hot dogs, barbeque, cheeseburgers, hamburgers, steak, bacon, sausage, cheese, etc.) and get more fresh fruits, vegetables, and whole grains If you have not heard anything from my staff in a week about any orders/referrals/studies from today, please contact us here to follow-up [phone]

## 2017-08-25 NOTE — Assessment & Plan Note (Signed)
Improved now.   

## 2017-08-25 NOTE — Assessment & Plan Note (Signed)
Check lipids 

## 2017-08-25 NOTE — Progress Notes (Signed)
BP (!) 142/82   Pulse 93   Temp 98.3 F (36.8 C) (Oral)   Resp 16   Wt 162 lb 9.6 oz (73.8 kg)   SpO2 95%   BMI 33.98 kg/m    Subjective:    Patient ID: Tara Cochran, female    DOB: [birth year], 57 y.o.   MRN: [id number]  HPI: Tara Cochran is a 57 y.o. female  Chief Complaint  Patient presents with  . Follow-up  . Sleeping Problem    up for the past 48 hrs  . Sexual Problem    ex had multiple partners and wants to be tested for STDs/STIs     HPI Patient is new to me, previous provider left practice  Type 2 diabetes; checking in sugars; highest in the last 2 weeks controlled; NOT on insulin; has cut back on her jardiance lately since sick Lab Results  Component Value Date   HGBA1C 6.0 05/25/2017    Was sick for month of February; started with a cold, then going away, went into her chest; goes into bronchitis, has COPD; hx of pneumonia, had an abscess and then coughed up blood (this was back in [place])  Hx of hepatitis C; tried the interferon, but thought she was dying and had to stop and came down and was cured by Dr. Allen Norris  HTN; usually 120/70; higher today; thinks her medicine is all good  Patient would like to be tested for STDs; no protection; found out partner was with other women  Takes paxil for depression; not an anxious person; most laid back person; no HI/SI  Smoking; cut back over the last few weeks  She lost 100 pounds; eating so much better since moving here; more veggies  She thinks she might have an overactive bladder; gets up in the morning; comes back after feeding the cats and has to urinate again; occasional incontinence; going on for 2 months  Depression screen [facility] 2/9 08/25/2017 05/25/2017 02/22/2017 11/09/2016 10/28/2016  Decreased Interest 0 0 0 0 0  Down, Depressed, Hopeless 0 0 0 0 0  PHQ - 2 Score 0 0 0 0 0  Altered sleeping - - - - -  Tired, decreased energy - - - - -  Change in appetite - - - - -  Feeling bad or  failure about yourself  - - - - -  Trouble concentrating - - - - -  Moving slowly or fidgety/restless - - - - -  Suicidal thoughts - - - - -  PHQ-9 Score - - - - -  Difficult doing work/chores - - - - -    Relevant past medical, surgical, family and social history reviewed Past Medical History:  Diagnosis Date  . Anemia    polycythemia  . Arthritis    hips/ knees/spine  . COPD (chronic obstructive pulmonary disease) ([place])   . Diabetes mellitus without complication ([place])   . GERD (gastroesophageal reflux disease)    food comes up when bends over  . Hepatitis    h/o tx x 3  . History of hiatal hernia   . History of kidney stones   . Hypertension   . Irritable behavior    gets mad easily  . Polycythemia   . Renal disorder    Past Surgical History:  Procedure Laterality Date  . CESAREAN SECTION    . CHOLECYSTECTOMY    . COLONOSCOPY N/A 12/12/2014   Procedure: COLONOSCOPY;  Surgeon: Lucilla Lame, MD;  Location: [facility];  Service: Gastroenterology;  Laterality: N/A;  . CYSTOSCOPY W/ URETERAL STENT PLACEMENT Left 11/10/2016   Procedure: CYSTOSCOPY WITH STENT REPLACEMENT;  Surgeon: [name], MD;  Location: [facility];  Service: Urology;  Laterality: Left;  . CYSTOSCOPY WITH RETROGRADE PYELOGRAM, URETEROSCOPY AND STENT PLACEMENT Left 10/28/2016   Procedure: CYSTOSCOPY WITH attempted RETROGRADE PYELOGRAM, URETEROSCOPY AND STENT PLACEMENT left;  Surgeon: [name], MD;  Location: [facility];  Service: Urology;  Laterality: Left;  . ESOPHAGOGASTRODUODENOSCOPY N/A 12/12/2014   Procedure: ESOPHAGOGASTRODUODENOSCOPY (EGD);  Surgeon: Lucilla Lame, MD;  Location: [facility];  Service: Gastroenterology;  Laterality: N/A;  . GALLBLADDER SURGERY    . POLYPECTOMY  12/12/2014   Procedure: POLYPECTOMY;  Surgeon: Lucilla Lame, MD;  Location: [facility];  Service: Gastroenterology;;  . STONE EXTRACTION WITH BASKET Left 11/10/2016   Procedure: STONE EXTRACTION  WITH BASKET;  Surgeon: [name], MD;  Location: [facility];  Service: Urology;  Laterality: Left;  . URETEROSCOPY Left 11/10/2016   Procedure: URETEROSCOPY;  Surgeon: [name], MD;  Location: [facility];  Service: Urology;  Laterality: Left;   Family History  Problem Relation Age of Onset  . Stroke Mother   . Hypertension Mother   . Hypertension Father   . Stroke Father   . Heart disease Sister    Social History   Tobacco Use  . Smoking status: Current Every Day Smoker    Packs/day: 0.50    Types: Cigarettes  . Smokeless tobacco: Never Used  Substance Use Topics  . Alcohol use: No    Alcohol/week: 0.0 - 0.6 oz  . Drug use: Yes    Frequency: 2.0 times per week    Types: Marijuana    Interim medical history since last visit reviewed. Allergies and medications reviewed  Review of Systems Per HPI unless specifically indicated above     Objective:    BP (!) 142/82   Pulse 93   Temp 98.3 F (36.8 C) (Oral)   Resp 16   Wt 162 lb 9.6 oz (73.8 kg)   SpO2 95%   BMI 33.98 kg/m   Wt Readings from Last 3 Encounters:  08/25/17 162 lb 9.6 oz (73.8 kg)  05/25/17 164 lb 3.2 oz (74.5 kg)  02/22/17 162 lb 11.2 oz (73.8 kg)    Physical Exam  Constitutional: She appears well-developed and well-nourished. No distress.  HENT:  Head: Normocephalic and atraumatic.  Eyes: EOM are normal. No scleral icterus.  Neck: Carotid bruit is present (bilateral, right slightly louder than the left). No thyromegaly present.  Cardiovascular: Normal rate, regular rhythm and normal heart sounds.  No murmur heard. Pulmonary/Chest: Effort normal and breath sounds normal. No respiratory distress. She has no wheezes.  Abdominal: Soft. Bowel sounds are normal. She exhibits no distension.  Genitourinary: Cervix exhibits no discharge. Vaginal discharge found.  Musculoskeletal: Normal range of motion. She exhibits no edema.  Neurological: She is alert. She exhibits normal muscle tone.    Skin: Skin is warm and dry. She is not diaphoretic. No pallor.  Psychiatric: She has a normal mood and affect. Her behavior is normal. Judgment and thought content normal.   Diabetic Foot Form - Detailed   Diabetic Foot Exam - detailed Diabetic Foot exam was performed with the following findings:  Yes 08/25/2017  3:04 PM  Visual Foot Exam completed.:  Yes  Pulse Foot Exam completed.:  Yes  Right Dorsalis Pedis:  Present Left Dorsalis Pedis:  Present  Sensory Foot Exam  Completed.:  Yes Semmes-Weinstein Monofilament Test R Site 1-Great Toe:  Pos L Site 1-Great Toe:  Pos           Assessment & Plan:   Problem List Items Addressed This Visit      Cardiovascular and Mediastinum   Hypertension    Transient bump today; try the DASH guidelines        Endocrine   Dyslipidemia associated with type 2 diabetes mellitus ([place])    Check lipids      DM type 2 (diabetes mellitus, type 2) ([place])    Foot exam UTD; due for an eye exam; patient will call to schedule        Other   Urinary urgency    Check urine today, refer to urologist to see if emptying problem      Relevant Orders   Ambulatory referral to Urology    Other Visit Diagnoses    Bilateral carotid bruits    -  Primary   Relevant Orders   US Carotid Duplex Bilateral   Concern about STD in female without diagnosis       Relevant Orders   HIV antibody (Completed)   RPR (Completed)   Hepatitis, Acute   Pap IG, CT/NG NAA, and HPV (high risk) (Completed)   WET PREP BY MOLECULAR PROBE   Screening for cervical cancer       Relevant Orders   Pap IG, CT/NG NAA, and HPV (high risk) (Completed)      Follow up plan: Return in about 3 months (around 11/22/2017) for twenty minute follow-up with fasting labs.  An after-visit summary was printed and given to the patient at [place].  Please see the patient instructions which may contain other information and recommendations beyond what is mentioned above in the assessment and  plan.  Meds ordered this encounter  Medications  . traZODone (DESYREL) 50 MG tablet    Sig: Take 0.5-1 tablets (25-50 mg total) by mouth at bedtime as needed for sleep.    Dispense:  30 tablet    Refill:  0    Orders Placed This Encounter  Procedures  . WET PREP BY MOLECULAR PROBE  . WET PREP BY MOLECULAR PROBE  . US Carotid Duplex Bilateral  . HIV antibody  . RPR  . Hepatitis, Acute  . Hepatitis panel, acute  . HCV RNA, Quantitative Real Time PCR  . Ambulatory referral to Urology

## 2017-08-25 NOTE — Assessment & Plan Note (Signed)
Foot exam UTD; due for an eye exam; patient will call to schedule

## 2017-08-25 NOTE — Assessment & Plan Note (Signed)
Transient bump today; try the DASH guidelines

## 2017-08-26 ENCOUNTER — Other Ambulatory Visit: Payer: Self-pay | Admitting: Family Medicine

## 2017-08-26 MED ORDER — METRONIDAZOLE 500 MG PO TABS: 500.0000 mg | ORAL_TABLET | Freq: Two times a day (BID) | ORAL | 0 refills | Status: DC

## 2017-08-26 NOTE — Progress Notes (Signed)
[place] for [employer]

## 2017-08-29 LAB — HEPATITIS PANEL, ACUTE
HEP A IGM: NONREACTIVE
HEP B C IGM: NONREACTIVE
HEP B S AG: NONREACTIVE
Hepatitis C Ab: REACTIVE — AB
SIGNAL TO CUT-OFF: 27.7 — ABNORMAL HIGH (ref <1.00)

## 2017-08-29 LAB — HCV RNA,QUANTITATIVE REAL TIME PCR
HCV QUANT LOG: 1.59 {Log_IU}/mL — AB
HCV RNA, PCR, QN: 39 [IU]/mL — AB

## 2017-08-29 LAB — WET PREP BY MOLECULAR PROBE
Candida species: NOT DETECTED
GARDNERELLA VAGINALIS: NOT DETECTED
MICRO NUMBER:: 90264339
SPECIMEN QUALITY:: ADEQUATE
TRICHOMONAS VAG: DETECTED — AB

## 2017-08-29 LAB — HIV ANTIBODY (ROUTINE TESTING W REFLEX): HIV 1&2 Ab, 4th Generation: NONREACTIVE

## 2017-08-29 LAB — RPR: RPR: NONREACTIVE

## 2017-08-30 LAB — PAP IG, CT-NG NAA, HPV HIGH-RISK
C. TRACHOMATIS RNA, TMA: NOT DETECTED
HPV DNA HIGH RISK: NOT DETECTED
N. GONORRHOEAE RNA, TMA: NOT DETECTED

## 2017-09-02 ENCOUNTER — Other Ambulatory Visit: Payer: Self-pay | Admitting: Family Medicine

## 2017-09-02 ENCOUNTER — Ambulatory Visit
Admission: RE | Admit: 2017-09-02 | Discharge: 2017-09-02 | Disposition: A | Discharge status: Home / Self Care | Payer: Medicare HMO | Source: Ambulatory Visit | Attending: Family Medicine | Admitting: Family Medicine

## 2017-09-02 DIAGNOSIS — I6523 Occlusion and stenosis of bilateral carotid arteries: Secondary | ICD-10-CM | POA: Insufficient documentation

## 2017-09-02 DIAGNOSIS — R0989 Other specified symptoms and signs involving the circulatory and respiratory systems: Secondary | ICD-10-CM | POA: Diagnosis not present

## 2017-09-02 NOTE — Assessment & Plan Note (Signed)
Start aspirin, refer to vascular

## 2017-09-16 ENCOUNTER — Ambulatory Visit: Payer: Medicare HMO

## 2017-10-03 ENCOUNTER — Encounter (INDEPENDENT_AMBULATORY_CARE_PROVIDER_SITE_OTHER): Payer: Self-pay | Admitting: Vascular Surgery

## 2017-10-03 ENCOUNTER — Ambulatory Visit (INDEPENDENT_AMBULATORY_CARE_PROVIDER_SITE_OTHER): Payer: Medicare HMO | Admitting: Vascular Surgery

## 2017-10-03 VITALS — BP 178/97 | HR 92 | Resp 16 | Ht 58.5 in | Wt 161.0 lb

## 2017-10-03 DIAGNOSIS — I6523 Occlusion and stenosis of bilateral carotid arteries: Secondary | ICD-10-CM

## 2017-10-03 DIAGNOSIS — E7800 Pure hypercholesterolemia, unspecified: Secondary | ICD-10-CM

## 2017-10-03 DIAGNOSIS — I10 Essential (primary) hypertension: Secondary | ICD-10-CM | POA: Diagnosis not present

## 2017-10-03 DIAGNOSIS — E119 Type 2 diabetes mellitus without complications: Secondary | ICD-10-CM | POA: Diagnosis not present

## 2017-10-04 ENCOUNTER — Encounter (INDEPENDENT_AMBULATORY_CARE_PROVIDER_SITE_OTHER): Payer: Self-pay | Admitting: Vascular Surgery

## 2017-10-04 NOTE — Progress Notes (Signed)
MRN : [id number]  Tara Cochran is a 57 y.o. (March 12, 1961) female who presents with chief complaint of  Chief Complaint  Patient presents with  . New Patient (Initial Visit)    Carotid Stenosis  .  History of Present Illness: The patient is seen for evaluation of carotid stenosis.   The patient denies amaurosis fugax. There is no recent history of TIA symptoms or focal motor deficits. There is no prior documented CVA.  There is no history of migraine headaches. There is no history of seizures.  The patient is taking enteric-coated aspirin 81 mg daily.  The patient has a history of coronary artery disease, no recent episodes of angina or shortness of breath. The patient denies PAD or claudication symptoms. There is a history of hyperlipidemia which is being treated with a statin.    Current Meds  Medication Sig  . ACCU-CHEK SMARTVIEW test strip as directed.  Marland Kitchen albuterol (PROVENTIL HFA;VENTOLIN HFA) 108 (90 Base) MCG/ACT inhaler Inhale 2 puffs into the lungs every 6 (six) hours as needed for wheezing or shortness of breath.  Marland Kitchen aspirin EC 81 MG tablet Take 1 tablet (81 mg total) by mouth daily.  . ASSURE COMFORT LANCETS 30G MISC   . Blood Glucose Calibration (ACCU-CHEK AVIVA) SOLN   . Blood Glucose Monitoring Suppl (ACCU-CHEK AVIVA PLUS) w/Device KIT   . budesonide-formoterol (SYMBICORT) 160-4.5 MCG/ACT inhaler INHALE 2 PUFFS BY MOUTH INTO THE LUNGS 2TIMES DAILY  . Eluxadoline (VIBERZI) 75 MG TABS Take 75 mg by mouth 2 (two) times daily.  . empagliflozin (JARDIANCE) 10 MG TABS tablet Take 10 mg by mouth daily.  Marland Kitchen glipiZIDE (GLUCOTROL) 5 MG tablet Take 0.5 tablets (2.5 mg total) by mouth daily before breakfast.  . Incontinence Supply Disposable (BLADDER CONTROL PADS EX ABSORB) MISC daily.  Elmore Guise Devices (ADJUSTABLE LANCING DEVICE) MISC   . losartan (COZAAR) 100 MG tablet TAKE 1 TABLET BY MOUTH ONCE DAILY  . metroNIDAZOLE (FLAGYL) 500 MG tablet Take 1 tablet (500 mg  total) by mouth 2 (two) times daily. No alcohol or cold medicines that contain alcohol while on this med  . Multiple Vitamins-Minerals (WOMENS MULTIVITAMIN PLUS PO) Take 1 tablet by mouth daily.   Marland Kitchen PARoxetine (PAXIL-CR) 37.5 MG 24 hr tablet TAKE 1 TABLET BY MOUTH ONCE A DAY  . rosuvastatin (CRESTOR) 20 MG tablet Take one tablet ( 20 mg total ) by mouth daily at bedtime.  . traZODone (DESYREL) 50 MG tablet Take 0.5-1 tablets (25-50 mg total) by mouth at bedtime as needed for sleep.    Past Medical History:  Diagnosis Date  . Anemia    polycythemia  . Arthritis    hips/ knees/spine  . COPD (chronic obstructive pulmonary disease) ([place])   . Diabetes mellitus without complication ([place])   . GERD (gastroesophageal reflux disease)    food comes up when bends over  . Hepatitis    h/o tx x 3  . History of hiatal hernia   . History of kidney stones   . Hypertension   . Irritable behavior    gets mad easily  . Polycythemia   . Renal disorder     Past Surgical History:  Procedure Laterality Date  . CESAREAN SECTION    . CHOLECYSTECTOMY    . COLONOSCOPY N/A 12/12/2014   Procedure: COLONOSCOPY;  Surgeon: Lucilla Lame, MD;  Location: [facility];  Service: Gastroenterology;  Laterality: N/A;  . CYSTOSCOPY W/ URETERAL STENT PLACEMENT Left 11/10/2016   Procedure: CYSTOSCOPY  WITH STENT REPLACEMENT;  Surgeon: [name], MD;  Location: [facility];  Service: Urology;  Laterality: Left;  . CYSTOSCOPY WITH RETROGRADE PYELOGRAM, URETEROSCOPY AND STENT PLACEMENT Left 10/28/2016   Procedure: CYSTOSCOPY WITH attempted RETROGRADE PYELOGRAM, URETEROSCOPY AND STENT PLACEMENT left;  Surgeon: [name], MD;  Location: [facility];  Service: Urology;  Laterality: Left;  . ESOPHAGOGASTRODUODENOSCOPY N/A 12/12/2014   Procedure: ESOPHAGOGASTRODUODENOSCOPY (EGD);  Surgeon: Lucilla Lame, MD;  Location: [facility];  Service: Gastroenterology;  Laterality: N/A;  . GALLBLADDER SURGERY    .  POLYPECTOMY  12/12/2014   Procedure: POLYPECTOMY;  Surgeon: Lucilla Lame, MD;  Location: [facility];  Service: Gastroenterology;;  . STONE EXTRACTION WITH BASKET Left 11/10/2016   Procedure: STONE EXTRACTION WITH BASKET;  Surgeon: [name], MD;  Location: [facility];  Service: Urology;  Laterality: Left;  . URETEROSCOPY Left 11/10/2016   Procedure: URETEROSCOPY;  Surgeon: [name], MD;  Location: [facility];  Service: Urology;  Laterality: Left;    Social History Social History   Tobacco Use  . Smoking status: Current Every Day Smoker    Packs/day: 0.50    Types: Cigarettes  . Smokeless tobacco: Never Used  Substance Use Topics  . Alcohol use: No    Alcohol/week: 0.0 - 0.6 oz  . Drug use: Yes    Frequency: 2.0 times per week    Types: Marijuana    Family History Family History  Problem Relation Age of Onset  . Stroke Mother   . Hypertension Mother   . Hypertension Father   . Stroke Father   . Heart disease Sister     Allergies  Allergen Reactions  . Morphine Sulfate Itching     REVIEW OF SYSTEMS (Negative unless checked)  Constitutional: _0 Weight loss  _1 Fever  _2 Chills Cardiac: _3 Chest pain   _4 Chest pressure   _5 Palpitations   _6 Shortness of breath when laying flat   _7 Shortness of breath with exertion. Vascular:  _8 Pain in legs with walking   _9 Pain in legs at rest  _10 History of DVT   _11 Phlebitis   _12 Swelling in legs   _13 Varicose veins   _14 Non-healing ulcers Pulmonary:   _15 Uses home oxygen   _16 Productive cough   _17 Hemoptysis   _18 Wheeze  _19 COPD   _20 Asthma Neurologic:  _21 Dizziness   _22 Seizures   _23 History of stroke   _24 History of TIA  _25 Aphasia   _26 Vissual changes   _27 Weakness or numbness in arm   _28 Weakness or numbness in leg Musculoskeletal:   _29 Joint swelling   _30 Joint pain   _31 Low back pain Hematologic:  _32 Easy bruising  _33 Easy bleeding   _34 Hypercoagulable state   _35 Anemic Gastrointestinal:  _36 Diarrhea   _37 Vomiting  _38 Gastroesophageal  reflux/heartburn   _39 Difficulty swallowing. Genitourinary:  _40 Chronic kidney disease   _41 Difficult urination  _42 Frequent urination   _43 Blood in urine Skin:  _44 Rashes   _45 Ulcers  Psychological:  _46 History of anxiety   _47  History of major depression.  Physical Examination  Vitals:   10/03/17 1041  BP: (!) 178/97  Pulse: 92  Resp: 16  Weight: 73 kg (161 lb)  Height: 4' 10.5" (1.486 m)   Body mass index is 33.08 kg/m. Gen: WD/WN, NAD Head: [place]/AT, No temporalis wasting.  Ear/Nose/Throat: Hearing grossly intact, nares w/o erythema or drainage Eyes: PER, EOMI, sclera nonicteric.  Neck: Supple, no large masses.   Pulmonary:  Good air movement, no audible wheezing bilaterally, no use of accessory muscles.  Cardiac: RRR, no JVD Vascular:  No carotid bruits Vessel Right  Left  Radial Palpable Palpable  Ulnar Palpable Palpable  Brachial Palpable Palpable  Carotid Palpable Palpable  Gastrointestinal: Non-distended. No guarding/no peritoneal signs.  Musculoskeletal: M/S 5/5 throughout.  No deformity or atrophy.  Neurologic: CN 2-12 intact. Symmetrical.  Speech is fluent. Motor exam as listed above. Psychiatric: Judgment intact, Mood & affect appropriate for pt's clinical situation. Dermatologic: No rashes or ulcers noted.  No changes consistent with cellulitis. Lymph : No lichenification or skin changes of chronic lymphedema.  CBC Lab Results  Component Value Date   WBC 10.5 10/30/2016   HGB 13.9 10/30/2016   HCT 40.4 10/30/2016   MCV 89.9 10/30/2016   PLT 280 10/30/2016    BMET    Component Value Date/Time   NA 143 05/25/2017 1443   NA 143 08/11/2015 1219   NA 140 04/23/2014 1756   K 4.1 05/25/2017 1443   K 4.0 04/23/2014 1756   CL 106 05/25/2017 1443   CL 102 04/23/2014 1756   CO2 28 05/25/2017 1443   CO2 29 04/23/2014 1756   GLUCOSE 90 05/25/2017 1443   GLUCOSE 176 (H) 04/23/2014 1756   BUN 21 05/25/2017 1443   BUN 10 08/11/2015 1219   BUN 11 04/23/2014 1756    CREATININE 0.63 05/25/2017 1443   CALCIUM 9.7 05/25/2017 1443   CALCIUM 9.3 04/23/2014 1756   GFRNONAA 101 05/25/2017 1443   GFRAA 117 05/25/2017 1443   CrCl cannot be calculated (Patient's most recent lab result is older than the maximum 21 days allowed.).  COAG Lab Results  Component Value Date   INR 1.20 10/29/2016    Radiology No results found.  Assessment/Plan 1. Carotid atherosclerosis, bilateral Recommend:  Given the patient's asymptomatic subcritical stenosis no further invasive testing or surgery at this time.  Continue antiplatelet therapy as prescribed Continue management of CAD, HTN and Hyperlipidemia Healthy heart diet,  encouraged exercise at least 4 times per week Follow up in 12 months with duplex ultrasound and physical exam   2. Essential hypertension Continue antihypertensive medications as already ordered, these medications have been reviewed and there are no changes at this time.   3. Type 2 diabetes mellitus without complication, without long-term current use of insulin (HCC) Continue hypoglycemic medications as already ordered, these medications have been reviewed and there are no changes at this time.  Hgb A1C to be monitored as already arranged by primary service   4. Pure hypercholesterolemia Continue statin as ordered and reviewed, no changes at this time     Hortencia Pilar, MD  10/04/2017 9:16 PM

## 2017-10-13 ENCOUNTER — Ambulatory Visit: Payer: Medicare HMO

## 2017-10-20 ENCOUNTER — Other Ambulatory Visit: Payer: Self-pay

## 2017-10-20 DIAGNOSIS — F3341 Major depressive disorder, recurrent, in partial remission: Secondary | ICD-10-CM

## 2017-10-20 DIAGNOSIS — I10 Essential (primary) hypertension: Secondary | ICD-10-CM

## 2017-10-21 ENCOUNTER — Other Ambulatory Visit: Payer: Self-pay

## 2017-10-21 DIAGNOSIS — F3341 Major depressive disorder, recurrent, in partial remission: Secondary | ICD-10-CM

## 2017-10-21 DIAGNOSIS — I10 Essential (primary) hypertension: Secondary | ICD-10-CM

## 2017-10-21 MED ORDER — LOSARTAN POTASSIUM 100 MG PO TABS: 100.0000 mg | ORAL_TABLET | Freq: Every day | ORAL | 0 refills | Status: DC

## 2017-10-21 MED ORDER — PAROXETINE HCL ER 37.5 MG PO TB24: 37.5000 mg | ORAL_TABLET | Freq: Every day | ORAL | 0 refills | Status: DC

## 2017-11-03 ENCOUNTER — Ambulatory Visit: Payer: Medicare HMO | Admitting: Urology

## 2017-11-03 ENCOUNTER — Encounter: Payer: Self-pay | Admitting: Urology

## 2017-11-03 VITALS — BP 134/78 | HR 83 | Ht 58.5 in | Wt 171.0 lb

## 2017-11-03 DIAGNOSIS — N3281 Overactive bladder: Secondary | ICD-10-CM

## 2017-11-03 DIAGNOSIS — R3915 Urgency of urination: Secondary | ICD-10-CM | POA: Diagnosis not present

## 2017-11-03 LAB — BLADDER SCAN AMB NON-IMAGING: SCAN RESULT: 78

## 2017-11-03 MED ORDER — MIRABEGRON ER 25 MG PO TB24: 25.0000 mg | ORAL_TABLET | Freq: Every day | ORAL | 11 refills | Status: AC

## 2017-11-03 NOTE — Progress Notes (Signed)
11/03/2017 3:33 PM   Tara Cochran [birth year] [id number]  Referring provider: Arnetha Courser, MD [address] [place], [place] [address]  Chief Complaint  Patient presents with  . Urinary Urgency    HPI: The patient is a 57 year old female with past medical history significant for nephrolithiasis who presents today with a chief complaint of urinary frequency.  1.  Urinary frequency The patient presents today with significant worsening of her urinary symptoms over the last year.  She notes that she has urinary frequency.  She often has to go to the bathroom a few minutes after voiding.  However, she only voids a small amount and repeat trips.  She feels that she empties her bladder.  She has nocturia x2.  She also has significant urgency with urge incontinence.  She has incontinence episodes approximately once per day.  She denies significant leakage with coughing, sneezing, and laughing.  She has never tried medication for this prior.  PVR: 78 cc  2.  History of nephrolithiasis She has had one episode of  nephrolithiasis requiring ureteroscopy in June 2018.  No history of prior to this of nephrolithiasis.  No current obstructive uropathy symptoms.  Her previous stone was 50% calcium phosphate, 30% magnesium ammonium phosphate, 15% ammonium acid urate, 2% calcium oxalate dihydrate, and 3% calcium oxalate monohydrate.   PMH: Past Medical History:  Diagnosis Date  . Anemia    polycythemia  . Arthritis    hips/ knees/spine  . COPD (chronic obstructive pulmonary disease) ([place])   . Diabetes mellitus without complication ([place])   . GERD (gastroesophageal reflux disease)    food comes up when bends over  . Hepatitis    h/o tx x 3  . History of hiatal hernia   . History of kidney stones   . Hypertension   . Irritable behavior    gets mad easily  . Polycythemia   . Renal disorder     Surgical History: Past Surgical History:  Procedure Laterality Date  .  CESAREAN SECTION    . CHOLECYSTECTOMY    . COLONOSCOPY N/A 12/12/2014   Procedure: COLONOSCOPY;  Surgeon: Lucilla Lame, MD;  Location: [facility];  Service: Gastroenterology;  Laterality: N/A;  . CYSTOSCOPY W/ URETERAL STENT PLACEMENT Left 11/10/2016   Procedure: CYSTOSCOPY WITH STENT REPLACEMENT;  Surgeon: [name], MD;  Location: [facility];  Service: Urology;  Laterality: Left;  . CYSTOSCOPY WITH RETROGRADE PYELOGRAM, URETEROSCOPY AND STENT PLACEMENT Left 10/28/2016   Procedure: CYSTOSCOPY WITH attempted RETROGRADE PYELOGRAM, URETEROSCOPY AND STENT PLACEMENT left;  Surgeon: [name], MD;  Location: [facility];  Service: Urology;  Laterality: Left;  . ESOPHAGOGASTRODUODENOSCOPY N/A 12/12/2014   Procedure: ESOPHAGOGASTRODUODENOSCOPY (EGD);  Surgeon: Lucilla Lame, MD;  Location: [facility];  Service: Gastroenterology;  Laterality: N/A;  . GALLBLADDER SURGERY    . POLYPECTOMY  12/12/2014   Procedure: POLYPECTOMY;  Surgeon: Lucilla Lame, MD;  Location: [facility];  Service: Gastroenterology;;  . STONE EXTRACTION WITH BASKET Left 11/10/2016   Procedure: STONE EXTRACTION WITH BASKET;  Surgeon: [name], MD;  Location: [facility];  Service: Urology;  Laterality: Left;  . URETEROSCOPY Left 11/10/2016   Procedure: URETEROSCOPY;  Surgeon: [name], MD;  Location: [facility];  Service: Urology;  Laterality: Left;    Home Medications:  Allergies as of 11/03/2017      Reactions   Morphine Sulfate Itching      Medication List  Accurate as of 11/03/17  3:33 PM. Always use your most recent med list.          ACCU-CHEK AVIVA PLUS w/Device Kit   ACCU-CHEK AVIVA Soln   ACCU-CHEK SMARTVIEW test strip Generic drug:  glucose blood as directed.   Adjustable Lancing Device Misc   albuterol 108 (90 Base) MCG/ACT inhaler Commonly known as:  PROVENTIL HFA;VENTOLIN HFA Inhale 2 puffs into the lungs every 6 (six) hours as needed for wheezing or  shortness of breath.   aspirin EC 81 MG tablet Take 1 tablet (81 mg total) by mouth daily.   ASSURE COMFORT LANCETS 30G Misc   Bladder Control Pads Ex Absorb Misc daily.   budesonide-formoterol 160-4.5 MCG/ACT inhaler Commonly known as:  SYMBICORT INHALE 2 PUFFS BY MOUTH INTO THE LUNGS 2TIMES DAILY   empagliflozin 10 MG Tabs tablet Commonly known as:  JARDIANCE Take 10 mg by mouth daily.   glipiZIDE 5 MG tablet Commonly known as:  GLUCOTROL Take 0.5 tablets (2.5 mg total) by mouth daily before breakfast.   losartan 100 MG tablet Commonly known as:  COZAAR Take 1 tablet (100 mg total) by mouth daily.   PARoxetine 37.5 MG 24 hr tablet Commonly known as:  PAXIL-CR Take 1 tablet (37.5 mg total) by mouth daily.   rosuvastatin 20 MG tablet Commonly known as:  CRESTOR Take one tablet ( 20 mg total ) by mouth daily at bedtime.   WOMENS MULTIVITAMIN PLUS PO Take 1 tablet by mouth daily.       Allergies:  Allergies  Allergen Reactions  . Morphine Sulfate Itching    Family History: Family History  Problem Relation Age of Onset  . Stroke Mother   . Hypertension Mother   . Hypertension Father   . Stroke Father   . Heart disease Sister     Social History:  reports that she has been smoking cigarettes.  She has been smoking about 0.50 packs per day. She has never used smokeless tobacco. She reports that she has current or past drug history. Drug: Marijuana. Frequency: 2.00 times per week. She reports that she does not drink alcohol.  ROS: UROLOGY Frequent Urination?: Yes Hard to postpone urination?: Yes Burning/pain with urination?: No Get up at night to urinate?: Yes Leakage of urine?: Yes Urine stream starts and stops?: No Trouble starting stream?: No Do you have to strain to urinate?: No Blood in urine?: No Urinary tract infection?: No Sexually transmitted disease?: No Injury to kidneys or bladder?: No Painful intercourse?: No Weak stream?: No Currently  pregnant?: No Vaginal bleeding?: No Last menstrual period?: n  Gastrointestinal Nausea?: No Vomiting?: No Indigestion/heartburn?: Yes Diarrhea?: Yes Constipation?: No  Constitutional Fever: No Night sweats?: No Weight loss?: No Fatigue?: No  Skin Skin rash/lesions?: No Itching?: No  Eyes Blurred vision?: No Double vision?: No  Ears/Nose/Throat Sore throat?: No Sinus problems?: Yes  Hematologic/Lymphatic Swollen glands?: No Easy bruising?: No  Cardiovascular Leg swelling?: No Chest pain?: No  Respiratory Cough?: Yes Shortness of breath?: No  Endocrine Excessive thirst?: No  Musculoskeletal Back pain?: No Joint pain?: No  Neurological Headaches?: No Dizziness?: No  Psychologic Depression?: Yes Anxiety?: No  Physical Exam: BP 134/78 (BP Location: Right Arm, Patient Position: Sitting, Cuff Size: Normal)   Pulse 83   Ht 4' 10.5" (1.486 m)   Wt 171 lb (77.6 kg)   BMI 35.13 kg/m   Constitutional:  Alert and oriented, No acute distress. HEENT: [place] AT, moist mucus membranes.  Trachea midline, no  masses. Cardiovascular: No clubbing, cyanosis, or edema. Respiratory: Normal respiratory effort, no increased work of breathing. GI: Abdomen is soft, nontender, nondistended, no abdominal masses GU: No CVA tenderness.  Skin: No rashes, bruises or suspicious lesions. Lymph: No cervical or inguinal adenopathy. Neurologic: Grossly intact, no focal deficits, moving all 4 extremities. Psychiatric: Normal mood and affect.  Laboratory Data: Lab Results  Component Value Date   WBC 10.5 10/30/2016   HGB 13.9 10/30/2016   HCT 40.4 10/30/2016   MCV 89.9 10/30/2016   PLT 280 10/30/2016    Lab Results  Component Value Date   CREATININE 0.63 05/25/2017    No results found for: PSA  No results found for: TESTOSTERONE  Lab Results  Component Value Date   HGBA1C 6.0 05/25/2017    Urinalysis    Component Value Date/Time   COLORURINE YELLOW 05/25/2017  1443   APPEARANCEUR CLEAR 05/25/2017 1443   APPEARANCEUR Cloudy (A) 11/24/2016 1301   LABSPEC 1.015 05/25/2017 1443   LABSPEC 1.046 04/23/2014 2102   PHURINE 6.0 05/25/2017 1443   GLUCOSEU 2+ (A) 05/25/2017 1443   GLUCOSEU Negative 04/23/2014 2102   HGBUR TRACE (A) 05/25/2017 1443   BILIRUBINUR neg 05/25/2017 1351   BILIRUBINUR Negative 11/24/2016 1301   BILIRUBINUR Negative 04/23/2014 2102   KETONESUR NEGATIVE 05/25/2017 1443   PROTEINUR NEGATIVE 05/25/2017 1443   UROBILINOGEN 0.2 05/25/2017 1351   NITRITE NEGATIVE 05/25/2017 1443   LEUKOCYTESUR 3+ (A) 05/25/2017 1443   LEUKOCYTESUR 1+ (A) 11/24/2016 1301   LEUKOCYTESUR Negative 04/23/2014 2102    Assessment & Plan:    1.  Overactive bladder The patient's urinary symptoms are consistent with an overactive bladder.  She does sufficiently empty her bladder  which is confirmed with the PVR today in our office.  We will start her on Myrbetriq 25 mg daily.  If this is cost prohibitive, we can try an anticholinergic.  She will follow-up in 3 months to assess her progress.  Return in about 3 months (around 02/03/2018).  [name], MD  [facility] [address] [place], [place] [address] [phone]

## 2017-11-08 ENCOUNTER — Ambulatory Visit (INDEPENDENT_AMBULATORY_CARE_PROVIDER_SITE_OTHER): Payer: Medicare HMO

## 2017-11-08 VITALS — BP 138/70 | HR 90 | Temp 99.0°F | Resp 14 | Ht 59.0 in | Wt 168.2 lb

## 2017-11-08 DIAGNOSIS — Z0000 Encounter for general adult medical examination without abnormal findings: Secondary | ICD-10-CM

## 2017-11-08 DIAGNOSIS — Z598 Other problems related to housing and economic circumstances: Secondary | ICD-10-CM

## 2017-11-08 DIAGNOSIS — Z599 Problem related to housing and economic circumstances, unspecified: Secondary | ICD-10-CM

## 2017-11-08 DIAGNOSIS — Z748 Other problems related to care provider dependency: Secondary | ICD-10-CM

## 2017-11-08 DIAGNOSIS — E119 Type 2 diabetes mellitus without complications: Secondary | ICD-10-CM | POA: Diagnosis not present

## 2017-11-08 DIAGNOSIS — Z1231 Encounter for screening mammogram for malignant neoplasm of breast: Secondary | ICD-10-CM | POA: Diagnosis not present

## 2017-11-08 DIAGNOSIS — H539 Unspecified visual disturbance: Secondary | ICD-10-CM

## 2017-11-08 DIAGNOSIS — Z1239 Encounter for other screening for malignant neoplasm of breast: Secondary | ICD-10-CM

## 2017-11-08 NOTE — Progress Notes (Signed)
Subjective:   Tara Cochran is a 57 y.o. female who presents for Medicare Annual (Subsequent) preventive examination.  Review of Systems:  N/A Cardiac Risk Factors include: diabetes mellitus;dyslipidemia;hypertension;obesity (BMI >30kg/m2);sedentary lifestyle;smoking/ tobacco exposure     Objective:     Vitals: BP 138/70 (BP Location: Left Arm, Patient Position: Sitting, Cuff Size: Normal)   Pulse 90   Temp 99 F (37.2 C) (Oral)   Resp 14   Ht _0  (1.499 m)   Wt 168 lb 3.2 oz (76.3 kg)   SpO2 96%   BMI 33.97 kg/m   Body mass index is 33.97 kg/m.  Advanced Directives 11/08/2017 02/22/2017 11/10/2016 11/09/2016 11/04/2016 10/29/2016 10/28/2016  Does Patient Have a Medical Advance Directive? _1  - No  Does patient want to make changes to medical advance directive? - - - - - - -  Would patient like information on creating a medical advance directive? Yes (MAU/Ambulatory/Procedural Areas - Information given) - No - Patient declined - - No - Patient declined No - Patient declined    Tobacco Social History   Tobacco Use  Smoking Status Current Every Day Smoker  . Packs/day: 0.50  . Years: 40.00  . Pack years: 20.00  . Types: Cigarettes  Smokeless Tobacco Never Used     Ready to quit: No Counseling given: Yes  Clinical Intake:  Pre-visit preparation completed: Yes  Pain : No/denies pain   BMI - recorded: 33.97 Nutritional Status: BMI > 30  Obese Nutrition Risk Assessment: Has the patient had any N/V/D within the last 2 months?  No Does the patient have any non-healing wounds?  No Has the patient had any unintentional weight loss or weight gain?  No  Is the patient diabetic?  Yes If diabetic, was a CBG obtained today?  No Did the patient bring in their glucometer from home?  No Comments: Pt monitors CBG's once per week. Denies any financial strains with the device or supplies.   Diabetic Exams: Diabetic Eye Exam: Overdue for diabetic eye exam. Pt  states she is not established with a physician to complete this exam. A referral to establish with an ophthalmologist has been placed today. A message has been sent to our referral coordinator for scheduling purposes. Pt is aware that she will receive a call from our office re: her appt. Verbalized acceptance and understanding. Diabetic Foot Exam: 08/25/17  How often do you need to have someone help you when you read instructions, pamphlets, or other written materials from your doctor or pharmacy?: 1 - Never  Interpreter Needed?: No  Information entered by :: AEversole, LPN  Hospitalizations/ED visits and surgeries occurring within the previous 12 months:  Within the previous 12 months, pt has not been hospitalized for any conditions and has not been treated by an emergency room clinician. However, within the previous 12 months, pt was seen by Dr. Pilar Jarvis on 11/10/16 for L ureteral calculus and underwent L ureteroscopy, L cystoscopy with stent placement and L ureter stone extraction with basket at [facility].  Past Medical History:  Diagnosis Date  . Anemia    polycythemia  . Arthritis    hips/ knees/spine  . COPD (chronic obstructive pulmonary disease) ([place])   . Diabetes mellitus without complication ([place])   . GERD (gastroesophageal reflux disease)    food comes up when bends over  . Hepatitis    h/o tx x 3  . History of hiatal hernia   . History of kidney stones   .  Hypertension   . Irritable behavior    gets mad easily  . Polycythemia   . Renal disorder    Past Surgical History:  Procedure Laterality Date  . CESAREAN SECTION    . CHOLECYSTECTOMY    . COLONOSCOPY N/A 12/12/2014   Procedure: COLONOSCOPY;  Surgeon: Lucilla Lame, MD;  Location: [place];  Service: Gastroenterology;  Laterality: N/A;  . CYSTOSCOPY W/ URETERAL STENT PLACEMENT Left 11/10/2016   Procedure: CYSTOSCOPY WITH STENT REPLACEMENT;  Surgeon: [name], MD;  Location: [facility];  Service: Urology;   Laterality: Left;  . CYSTOSCOPY WITH RETROGRADE PYELOGRAM, URETEROSCOPY AND STENT PLACEMENT Left 10/28/2016   Procedure: CYSTOSCOPY WITH attempted RETROGRADE PYELOGRAM, URETEROSCOPY AND STENT PLACEMENT left;  Surgeon: [name], MD;  Location: [facility];  Service: Urology;  Laterality: Left;  . ESOPHAGOGASTRODUODENOSCOPY N/A 12/12/2014   Procedure: ESOPHAGOGASTRODUODENOSCOPY (EGD);  Surgeon: Lucilla Lame, MD;  Location: [place];  Service: Gastroenterology;  Laterality: N/A;  . GALLBLADDER SURGERY    . POLYPECTOMY  12/12/2014   Procedure: POLYPECTOMY;  Surgeon: Lucilla Lame, MD;  Location: [place];  Service: Gastroenterology;;  . STONE EXTRACTION WITH BASKET Left 11/10/2016   Procedure: STONE EXTRACTION WITH BASKET;  Surgeon: [name], MD;  Location: [facility];  Service: Urology;  Laterality: Left;  . URETEROSCOPY Left 11/10/2016   Procedure: URETEROSCOPY;  Surgeon: [name], MD;  Location: [facility];  Service: Urology;  Laterality: Left;   Family History  Problem Relation Age of Onset  . Stroke Mother   . Hypertension Mother   . Hypertension Father   . Stroke Father   . Heart disease Sister    Social History   Socioeconomic History  . Marital status: Divorced    Spouse name: Not on file  . Number of children: 3  . Years of education: some college  . Highest education level: 12th grade  Occupational History  . Occupation: Disabled  Social Needs  . Financial resource strain: Not hard at all  . Food insecurity:    Worry: Never true    Inability: Never true  . Transportation needs:    Medical: No    Non-medical: No  Tobacco Use  . Smoking status: Current Every Day Smoker    Packs/day: 0.50    Years: 40.00    Pack years: 20.00    Types: Cigarettes  . Smokeless tobacco: Never Used  Substance and Sexual Activity  . Alcohol use: Not Currently  . Drug use: Yes    Frequency: 2.0 times per week    Types: Marijuana  . Sexual activity:  Not Currently  Lifestyle  . Physical activity:    Days per week: 0 days    Minutes per session: 0 min  . Stress: Not at all  Relationships  . Social connections:    Talks on phone: Patient refused    Gets together: Patient refused    Attends religious service: Patient refused    Active member of club or organization: Patient refused    Attends meetings of clubs or organizations: Patient refused    Relationship status: Divorced  Other Topics Concern  . Not on file  Social History Narrative  . Not on file    Outpatient Encounter Medications as of 11/08/2017  Medication Sig  . ACCU-CHEK SMARTVIEW test strip as directed.  Marland Kitchen albuterol (PROVENTIL HFA;VENTOLIN HFA) 108 (90 Base) MCG/ACT inhaler Inhale 2 puffs into the lungs every 6 (six) hours as needed for wheezing or shortness  of breath.  Marland Kitchen aspirin EC 81 MG tablet Take 1 tablet (81 mg total) by mouth daily.  . ASSURE COMFORT LANCETS 30G MISC   . Blood Glucose Calibration (ACCU-CHEK AVIVA) SOLN   . Blood Glucose Monitoring Suppl (ACCU-CHEK AVIVA PLUS) w/Device KIT   . budesonide-formoterol (SYMBICORT) 160-4.5 MCG/ACT inhaler INHALE 2 PUFFS BY MOUTH INTO THE LUNGS 2TIMES DAILY  . empagliflozin (JARDIANCE) 10 MG TABS tablet Take 10 mg by mouth daily.  Marland Kitchen glipiZIDE (GLUCOTROL) 5 MG tablet Take 0.5 tablets (2.5 mg total) by mouth daily before breakfast.  . Incontinence Supply Disposable (BLADDER CONTROL PADS EX ABSORB) MISC daily.  Elmore Guise Devices (ADJUSTABLE LANCING DEVICE) MISC   . losartan (COZAAR) 100 MG tablet Take 1 tablet (100 mg total) by mouth daily.  . mirabegron ER (MYRBETRIQ) 25 MG TB24 tablet Take 1 tablet (25 mg total) by mouth daily.  . Multiple Vitamins-Minerals (WOMENS MULTIVITAMIN PLUS PO) Take 1 tablet by mouth daily.   Marland Kitchen PARoxetine (PAXIL-CR) 37.5 MG 24 hr tablet Take 1 tablet (37.5 mg total) by mouth daily.  . rosuvastatin (CRESTOR) 20 MG tablet Take one tablet ( 20 mg total ) by mouth daily at bedtime.   No  facility-administered encounter medications on file as of 11/08/2017.     Activities of Daily Living In your present state of health, do you have any difficulty performing the following activities: 11/08/2017 08/25/2017  Hearing? N N  Comment denies hearing aids -  Vision? N N  Comment wears eyeglasses, "floaters" -  Difficulty concentrating or making decisions? Y N  Comment short term memory loss -  Walking or climbing stairs? Y Y  Comment arthritic pain -  Dressing or bathing? N N  Doing errands, shopping? N N  Preparing Food and eating ? N -  Comment upper partial dentures -  Using the Toilet? N -  In the past six months, have you accidently leaked urine? N -  Do you have problems with loss of bowel control? N -  Managing your Medications? N -  Managing your Finances? N -  Housekeeping or managing your Housekeeping? N -  Some recent data might be hidden    Patient Care Team: Lada, Satira Anis, MD as PCP - General (Family Medicine) Lucilla Lame, MD as Consulting Physician (Gastroenterology) Delana Meyer, Dolores Lory, MD as Consulting Physician (Vascular Surgery) [name], MD as Consulting Physician (Urology)    Assessment:   This is a routine wellness examination for [place].  Exercise Activities and Dietary recommendations Current Exercise Habits: The patient does not participate in regular exercise at present, Exercise limited by: orthopedic condition(s)(arthritic pain)  Goals    . Exercise 150 min/wk Moderate Activity     Recommend to exercise for at least 150 minutes per week.       Fall Risk Fall Risk  11/08/2017 08/25/2017 05/25/2017 02/22/2017 11/09/2016  Falls in the past year? Yes Yes No No No  Comment syncope - - - -  Number falls in past yr: 1 2 or more - - -  Injury with Fall? Yes No - - -  Comment struck head - - - -  Risk Factor Category  High Fall Risk - - - -  Comment drug use, arthritic pain - - - -  Risk for fall due to : History of  fall(s);Impaired balance/gait;Medication side effect;Other (Comment) - - - -  Follow up Falls evaluation completed;Education provided;Falls prevention discussed - - - -   FALL RISK PREVENTION PERTAINING  TO HOME: Is your home free of loose throw rugs in walkways, pet beds, electrical cords, etc? Yes Is there adequate lighting in your home to reduce risk of falls?  Yes Are there stairs in or around your home WITH handrails? No, has a ramp  ASSISTIVE DEVICES UTILIZED TO PREVENT FALLS: Use of a cane, walker or w/c? No Grab bars in the bathroom? Yes  Shower chair or a place to sit while bathing? No An elevated toilet seat or a handicapped toilet? No  Timed Get Up and Go Performed: Yes. Pt ambulated 10 feet within 8 sec. Gait stead-fast and without the use of an assistive device. No intervention required at this time. Fall risk prevention has been discussed.  Community Resource Referral:  Pt declined my offer to send [employer] Referral to [facility] for a shower chair or an elevated toilet seat. However, pt is having struggles with her transportation. States her car is no longer registered with [employer] because it cannot pass inspection. Currently having her friend transport her to an from appts. Community resource referral placed with [employer] in hopes of repairing her car and assisting with [place] registration if possible.  Depression Screen PHQ 2/9 Scores 11/08/2017 08/25/2017 05/25/2017 02/22/2017  PHQ - 2 Score 0 0 0 0  PHQ- 9 Score 3 - - -     Cognitive Function     6CIT Screen 11/08/2017 10/11/2016  What Year? 0 points 0 points  What month? 0 points 0 points  What time? 0 points 0 points  Count back from 20 0 points 0 points  Months in reverse 0 points 0 points  Repeat phrase 0 points 0 points  Total Score 0 0    Immunization History  Administered Date(s) Administered  . Influenza,inj,Quad PF,6+ Mos 07/11/2015, 05/05/2016, 05/25/2017  . Pneumococcal Conjugate-13 05/05/2016     Qualifies for Shingles Vaccine? Yes. Due for Shingrix. Education has been provided regarding the importance of this vaccine. Pt has been advised to call her insurance company to determine her out of pocket expense. Advised she may also receive this vaccine at her local pharmacy or Health Dept. Verbalized acceptance and understanding.  Due for Tdap vaccine. Education has been provided regarding the importance of this vaccine. Pt has been advised she may receive this vaccine at her local pharmacy or Health Dept. Also advised to provide a copy of her vaccination record if she chooses to receive this vaccine at her local pharmacy. Verbalized acceptance and understanding.  Screening Tests Health Maintenance  Topic Date Due  . OPHTHALMOLOGY EXAM  01/12/1972  . MAMMOGRAM  06/11/2016  . TETANUS/TDAP  06/28/2026 (Originally 01/11/1981)  . HEMOGLOBIN A1C  11/22/2017  . INFLUENZA VACCINE  01/26/2018  . FOOT EXAM  08/25/2018  . COLONOSCOPY  12/12/2019  . PAP SMEAR  08/25/2020  . PNEUMOCOCCAL POLYSACCHARIDE VACCINE (2) 05/05/2021  . Hepatitis C Screening  Completed  . HIV Screening  Completed    Cancer Screenings: Lung: Low Dose CT Chest recommended if Age 48-80 years, 30 pack-year currently smoking OR have quit w/in 15years. Patient does not qualify. Breast:  Up to date on Mammogram? No. Completed 06/12/15. Repeat every year. Ordered mammogram today. Pt provided with contact information and advised to schedule her appt. Verbalized acceptance and understanding.   Up to date of Bone Density/Dexa? No, does not yet qualify for this screening. Colorectal: Completed 12/12/14. Repeat every 5 years.  Additional Screenings: Hepatitis C Screening: Completed 08/25/17    Plan:  I have personally  reviewed and addressed the Medicare Annual Wellness questionnaire and have noted the following in the patient's chart:  A. Medical and social history B. Use of alcohol, tobacco or illicit drugs  C. Current  medications and supplements D. Functional ability and status E.  Nutritional status F.  Physical activity G. Advance directives H. List of other physicians I.  Hospitalizations, surgeries, and ER visits in previous 12 months J.  [place] such as hearing and vision if needed, cognitive and depression L. Referrals and appointments  In addition, I have reviewed and discussed with patient certain preventive protocols, quality metrics, and best practice recommendations. A written personalized care plan for preventive services as well as general preventive health recommendations were provided to patient.  See attached scanned questionnaire for additional information.   Signed,  Aleatha Borer, LPN Nurse Health Advisor

## 2017-11-08 NOTE — Patient Instructions (Addendum)
Tara Cochran , Thank you for taking time to come for your Medicare Wellness Visit. I appreciate your ongoing commitment to your health goals. Please review the following plan we discussed and let me know if I can assist you in the future.   Screening recommendations/referrals: Colorectal Screening: Completed 12/12/14. Repeat every 5 years. Mammogram: Completed 06/12/15. Repeat every year. Ordered today. Please call to schedule your appointment. Bone Density: Not yet required Lung Cancer Screening: You will receive a call from Burgess Estelle, RN Hepatitis C Screening: Completed 08/25/17  Vision and Dental Exams: Recommended annual ophthalmology exams for early detection of glaucoma and other disorders of the eye Recommended annual dental exams for proper oral hygiene  Diabetic Exams: Recommended annual diabetic eye exams for early detection of retinopathy Recommended annual diabetic foot exams for early detection of peripheral neuropathy.  Diabetic Eye Exam: Please call your ophthalmologist to schedule this appointment. Diabetic Foot Exam: Completed 08/25/17  Vaccinations: Influenza vaccine: Up to date Pneumococcal vaccine: Not yet required Tdap vaccine: Declined. Please call your insurance company to determine your out of pocket expense. You may also receive this vaccine at your local pharmacy or Health Dept. Shingles vaccine: Please call your insurance company to determine your out of pocket expense for the Shingrix vaccine. You may also receive this vaccine at your local pharmacy or Health Dept.  Advanced directives: Advance directive discussed with you today. I have provided a copy for you to complete at home and have notarized. Once this is complete please bring a copy in to our office so we can scan it into your chart.  Conditions/risks identified: Recommend to drink at least 6-8 8oz glasses of water per day.  Next appointment: Please schedule your Annual Wellness Visit with your  Nurse Health Advisor in one year.  Preventive Care 40-64 Years, Female Preventive care refers to lifestyle choices and visits with your health care provider that can promote health and wellness. What does preventive care include?  A yearly physical exam. This is also called an annual well check.  Dental exams once or twice a year.  Routine eye exams. Ask your health care provider how often you should have your eyes checked.  Personal lifestyle choices, including:  Daily care of your teeth and gums.  Regular physical activity.  Eating a healthy diet.  Avoiding tobacco and drug use.  Limiting alcohol use.  Practicing safe sex.  Taking low-dose aspirin daily starting at age 80.  Taking vitamin and mineral supplements as recommended by your health care provider. What happens during an annual well check? The services and screenings done by your health care provider during your annual well check will depend on your age, overall health, lifestyle risk factors, and family history of disease. Counseling  Your health care provider may ask you questions about your:  Alcohol use.  Tobacco use.  Drug use.  Emotional well-being.  Home and relationship well-being.  Sexual activity.  Eating habits.  Work and work Statistician.  Method of birth control.  Menstrual cycle.  Pregnancy history. Screening  You may have the following tests or measurements:  Height, weight, and BMI.  Blood pressure.  Lipid and cholesterol levels. These may be checked every 5 years, or more frequently if you are over 30 years old.  Skin check.  Lung cancer screening. You may have this screening every year starting at age 59 if you have a 30-pack-year history of smoking and currently smoke or have quit within the past 15 years.  Fecal  occult blood test (FOBT) of the stool. You may have this test every year starting at age 19.  Flexible sigmoidoscopy or colonoscopy. You may have a  sigmoidoscopy every 5 years or a colonoscopy every 10 years starting at age 58.  Hepatitis C blood test.  Hepatitis B blood test.  Sexually transmitted disease (STD) testing.  Diabetes screening. This is done by checking your blood sugar (glucose) after you have not eaten for a while (fasting). You may have this done every 1-3 years.  Mammogram. This may be done every 1-2 years. Talk to your health care provider about when you should start having regular mammograms. This may depend on whether you have a family history of breast cancer.  BRCA-related cancer screening. This may be done if you have a family history of breast, ovarian, tubal, or peritoneal cancers.  Pelvic exam and Pap test. This may be done every 3 years starting at age 89. Starting at age 7, this may be done every 5 years if you have a Pap test in combination with an HPV test.  Bone density scan. This is done to screen for osteoporosis. You may have this scan if you are at high risk for osteoporosis. Discuss your test results, treatment options, and if necessary, the need for more tests with your health care provider. Vaccines  Your health care provider may recommend certain vaccines, such as:  Influenza vaccine. This is recommended every year.  Tetanus, diphtheria, and acellular pertussis (Tdap, Td) vaccine. You may need a Td booster every 10 years.  Zoster vaccine. You may need this after age 2.  Pneumococcal 13-valent conjugate (PCV13) vaccine. You may need this if you have certain conditions and were not previously vaccinated.  Pneumococcal polysaccharide (PPSV23) vaccine. You may need one or two doses if you smoke cigarettes or if you have certain conditions. Talk to your health care provider about which screenings and vaccines you need and how often you need them. This information is not intended to replace advice given to you by your health care provider. Make sure you discuss any questions you have with your  health care provider. Document Released: 07/11/2015 Document Revised: 03/03/2016 Document Reviewed: 04/15/2015 Elsevier Interactive Patient Education  2017 [place] Prevention in the Home Falls can cause injuries. They can happen to people of all ages. There are many things you can do to make your home safe and to help prevent falls. What can I do on the outside of my home?  Regularly fix the edges of walkways and driveways and fix any cracks.  Remove anything that might make you trip as you walk through a door, such as a raised step or threshold.  Trim any bushes or trees on the path to your home.  Use bright outdoor lighting.  Clear any walking paths of anything that might make someone trip, such as rocks or tools.  Regularly check to see if handrails are loose or broken. Make sure that both sides of any steps have handrails.  Any raised decks and porches should have guardrails on the edges.  Have any leaves, snow, or ice cleared regularly.  Use sand or salt on walking paths during winter.  Clean up any spills in your garage right away. This includes oil or grease spills. What can I do in the bathroom?  Use night lights.  Install grab bars by the toilet and in the tub and shower. Do not use towel bars as grab bars.  Use  non-skid mats or decals in the tub or shower.  If you need to sit down in the shower, use a plastic, non-slip stool.  Keep the floor dry. Clean up any water that spills on the floor as soon as it happens.  Remove soap buildup in the tub or shower regularly.  Attach bath mats securely with double-sided non-slip rug tape.  Do not have throw rugs and other things on the floor that can make you trip. What can I do in the bedroom?  Use night lights.  Make sure that you have a light by your bed that is easy to reach.  Do not use any sheets or blankets that are too big for your bed. They should not hang down onto the floor.  Have a firm  chair that has side arms. You can use this for support while you get dressed.  Do not have throw rugs and other things on the floor that can make you trip. What can I do in the kitchen?  Clean up any spills right away.  Avoid walking on wet floors.  Keep items that you use a lot in easy-to-reach places.  If you need to reach something above you, use a strong step stool that has a grab bar.  Keep electrical cords out of the way.  Do not use floor polish or wax that makes floors slippery. If you must use wax, use non-skid floor wax.  Do not have throw rugs and other things on the floor that can make you trip. What can I do with my stairs?  Do not leave any items on the stairs.  Make sure that there are handrails on both sides of the stairs and use them. Fix handrails that are broken or loose. Make sure that handrails are as long as the stairways.  Check any carpeting to make sure that it is firmly attached to the stairs. Fix any carpet that is loose or worn.  Avoid having throw rugs at the top or bottom of the stairs. If you do have throw rugs, attach them to the floor with carpet tape.  Make sure that you have a light switch at the top of the stairs and the bottom of the stairs. If you do not have them, ask someone to add them for you. What else can I do to help prevent falls?  Wear shoes that:  Do not have high heels.  Have rubber bottoms.  Are comfortable and fit you well.  Are closed at the toe. Do not wear sandals.  If you use a stepladder:  Make sure that it is fully opened. Do not climb a closed stepladder.  Make sure that both sides of the stepladder are locked into place.  Ask someone to hold it for you, if possible.  Clearly mark and make sure that you can see:  Any grab bars or handrails.  First and last steps.  Where the edge of each step is.  Use tools that help you move around (mobility aids) if they are needed. These  include:  Canes.  Walkers.  Scooters.  Crutches.  Turn on the lights when you go into a dark area. Replace any light bulbs as soon as they burn out.  Set up your furniture so you have a clear path. Avoid moving your furniture around.  If any of your floors are uneven, fix them.  If there are any pets around you, be aware of where they are.  Review your medicines with  your doctor. Some medicines can make you feel dizzy. This can increase your chance of falling. Ask your doctor what other things that you can do to help prevent falls. This information is not intended to replace advice given to you by your health care provider. Make sure you discuss any questions you have with your health care provider. Document Released: 04/10/2009 Document Revised: 11/20/2015 Document Reviewed: 07/19/2014 Elsevier Interactive Patient Education  2017 [place] with Quitting Smoking Quitting smoking is a physical and mental challenge. You will face cravings, withdrawal symptoms, and temptation. Before quitting, work with your health care provider to make a plan that can help you cope. Preparation can help you quit and keep you from giving in. How can I cope with cravings? Cravings usually last for 5-10 minutes. If you get through it, the craving will pass. Consider taking the following actions to help you cope with cravings:  Keep your mouth busy: ? Chew sugar-free gum. ? Suck on hard candies or a straw. ? Brush your teeth.  Keep your hands and body busy: ? Immediately change to a different activity when you feel a craving. ? Squeeze or play with a ball. ? Do an activity or a hobby, like making bead jewelry, practicing needlepoint, or working with wood. ? Mix up your normal routine. ? Take a short exercise break. Go for a quick walk or run up and down stairs. ? Spend time in public places where smoking is not allowed.  Focus on doing something kind or helpful for someone  else.  Call a friend or family member to talk during a craving.  Join a support group.  Call a quit line, such as 1-800-QUIT-NOW.  Talk with your health care provider about medicines that might help you cope with cravings and make quitting easier for you.  How can I deal with withdrawal symptoms? Your body may experience negative effects as it tries to get used to not having nicotine in the system. These effects are called withdrawal symptoms. They may include:  Feeling hungrier than normal.  Trouble concentrating.  Irritability.  Trouble sleeping.  Feeling depressed.  Restlessness and agitation.  Craving a cigarette.  To manage withdrawal symptoms:  Avoid places, people, and activities that trigger your cravings.  Remember why you want to quit.  Get plenty of sleep.  Avoid coffee and other caffeinated drinks. These may worsen some of your symptoms.  How can I handle social situations? Social situations can be difficult when you are quitting smoking, especially in the first few weeks. To manage this, you can:  Avoid parties, bars, and other social situations where people might be smoking.  Avoid alcohol.  Leave right away if you have the urge to smoke.  Explain to your family and friends that you are quitting smoking. Ask for understanding and support.  Plan activities with friends or family where smoking is not an option.  What are some ways I can cope with stress? Wanting to smoke may cause stress, and stress can make you want to smoke. Find ways to manage your stress. Relaxation techniques can help. For example:  Breathe slowly and deeply, in through your nose and out through your mouth.  Listen to soothing, relaxing music.  Talk with a family member or friend about your stress.  Light a candle.  Soak in a bath or take a shower.  Think about a peaceful place.  What are some ways I can prevent weight gain? Be aware that  many people gain weight after  they quit smoking. However, not everyone does. To keep from gaining weight, have a plan in place before you quit and stick to the plan after you quit. Your plan should include:  Having healthy snacks. When you have a craving, it may help to: ? Eat plain popcorn, crunchy carrots, celery, or other cut vegetables. ? Chew sugar-free gum.  Changing how you eat: ? Eat small portion sizes at meals. ? Eat 4-6 small meals throughout the day instead of 1-2 large meals a day. ? Be mindful when you eat. Do not watch television or do other things that might distract you as you eat.  Exercising regularly: ? Make time to exercise each day. If you do not have time for a long workout, do short bouts of exercise for 5-10 minutes several times a day. ? Do some form of strengthening exercise, like weight lifting, and some form of aerobic exercise, like running or swimming.  Drinking plenty of water or other low-calorie or no-calorie drinks. Drink 6-8 glasses of water daily, or as much as instructed by your health care provider.  Summary  Quitting smoking is a physical and mental challenge. You will face cravings, withdrawal symptoms, and temptation to smoke again. Preparation can help you as you go through these challenges.  You can cope with cravings by keeping your mouth busy (such as by chewing gum), keeping your body and hands busy, and making calls to family, friends, or a helpline for people who want to quit smoking.  You can cope with withdrawal symptoms by avoiding places where people smoke, avoiding drinks with caffeine, and getting plenty of rest.  Ask your health care provider about the different ways to prevent weight gain, avoid stress, and handle social situations. This information is not intended to replace advice given to you by your health care provider. Make sure you discuss any questions you have with your health care provider. Document Released: 06/11/2016 Document Revised: 06/11/2016  Document Reviewed: 06/11/2016 Elsevier Interactive Patient Education  [employer].

## 2017-11-09 ENCOUNTER — Telehealth: Payer: Self-pay | Admitting: Family Medicine

## 2017-11-09 ENCOUNTER — Other Ambulatory Visit: Payer: Self-pay | Admitting: Family Medicine

## 2017-11-09 NOTE — Telephone Encounter (Signed)
Pt.notified

## 2017-11-09 NOTE — Telephone Encounter (Signed)
Please let pt know that she does not need to be checking her fingerstick blood sugars [facility] Best Practices are my guide Unless first six months of diabetes or on insulin, not necessary; she can check once in a blue moon if desired with her own strips, but I can't justify to Medicare that she has to be checking these daily Thank you

## 2017-11-09 NOTE — Progress Notes (Signed)
No need for checking FSBS per [facility] Best Practices

## 2017-11-22 ENCOUNTER — Encounter: Payer: Self-pay | Admitting: Family Medicine

## 2017-11-22 ENCOUNTER — Ambulatory Visit (INDEPENDENT_AMBULATORY_CARE_PROVIDER_SITE_OTHER): Payer: Medicare HMO | Admitting: Family Medicine

## 2017-11-22 VITALS — BP 122/70 | HR 100 | Temp 98.4°F | Resp 14 | Ht 59.0 in | Wt 164.8 lb

## 2017-11-22 DIAGNOSIS — E1169 Type 2 diabetes mellitus with other specified complication: Secondary | ICD-10-CM

## 2017-11-22 DIAGNOSIS — Z794 Long term (current) use of insulin: Secondary | ICD-10-CM

## 2017-11-22 DIAGNOSIS — J42 Unspecified chronic bronchitis: Secondary | ICD-10-CM

## 2017-11-22 DIAGNOSIS — Z5181 Encounter for therapeutic drug level monitoring: Secondary | ICD-10-CM

## 2017-11-22 DIAGNOSIS — R933 Abnormal findings on diagnostic imaging of other parts of digestive tract: Secondary | ICD-10-CM | POA: Diagnosis not present

## 2017-11-22 DIAGNOSIS — E785 Hyperlipidemia, unspecified: Secondary | ICD-10-CM | POA: Diagnosis not present

## 2017-11-22 DIAGNOSIS — E119 Type 2 diabetes mellitus without complications: Secondary | ICD-10-CM

## 2017-11-22 DIAGNOSIS — E782 Mixed hyperlipidemia: Secondary | ICD-10-CM

## 2017-11-22 DIAGNOSIS — R278 Other lack of coordination: Secondary | ICD-10-CM

## 2017-11-22 DIAGNOSIS — Z720 Tobacco use: Secondary | ICD-10-CM | POA: Insufficient documentation

## 2017-11-22 DIAGNOSIS — R9389 Abnormal findings on diagnostic imaging of other specified body structures: Secondary | ICD-10-CM

## 2017-11-22 DIAGNOSIS — D751 Secondary polycythemia: Secondary | ICD-10-CM

## 2017-11-22 DIAGNOSIS — D582 Other hemoglobinopathies: Secondary | ICD-10-CM

## 2017-11-22 DIAGNOSIS — J449 Chronic obstructive pulmonary disease, unspecified: Secondary | ICD-10-CM

## 2017-11-22 MED ORDER — ALBUTEROL SULFATE HFA 108 (90 BASE) MCG/ACT IN AERS
2.0000 | INHALATION_SPRAY | Freq: Four times a day (QID) | RESPIRATORY_TRACT | 2 refills | Status: AC | PRN
Start: 2017-11-22 — End: ?

## 2017-11-22 MED ORDER — GLIPIZIDE 5 MG PO TABS
2.5000 mg | ORAL_TABLET | Freq: Every day | ORAL | 0 refills | Status: AC
Start: 2017-11-22 — End: ?

## 2017-11-22 MED ORDER — EMPAGLIFLOZIN 10 MG PO TABS: 10.0000 mg | ORAL_TABLET | Freq: Every day | ORAL | 0 refills | Status: AC

## 2017-11-22 MED ORDER — BUDESONIDE-FORMOTEROL FUMARATE 160-4.5 MCG/ACT IN AERO: INHALATION_SPRAY | RESPIRATORY_TRACT | 5 refills | Status: AC

## 2017-11-22 MED ORDER — ROSUVASTATIN CALCIUM 20 MG PO TABS
ORAL_TABLET | ORAL | 0 refills | Status: AC
Start: 2017-11-22 — End: ?

## 2017-11-22 NOTE — Progress Notes (Signed)
BP 122/70   Pulse 100   Temp 98.4 F (36.9 C) (Oral)   Resp 14   Ht 4\' 11"  (1.499 m)   Wt 164 lb 12.8 oz (74.8 kg)   SpO2 94%   BMI 33.29 kg/m    Subjective:    Patient ID: Tara Cochran, female    DOB: Apr 06, 1961, 57 y.o.   MRN: [id number]  HPI: Tara Cochran is a 57 y.o. female  Chief Complaint  Patient presents with  . Follow-up  . Hand Pain    fingers    HPI Patient is here for f/u  Type 2 diabetes; taking glipizide and empagliflozin; no lows recently; eye exam coming up in July; vision is getting worse Lab Results  Component Value Date   HGBA1C 6.0 05/25/2017    High cholesterol; taking rosuvastatin; no problems with medicine; avoiding fatty meats; does like cheese Lab Results  Component Value Date   CHOL 202 (H) 05/25/2017   HDL 71 05/25/2017   LDLCALC 107 (H) 05/25/2017   TRIG 126 05/25/2017   CHOLHDL 2.8 05/25/2017   Hypertension; on losartan; controlled today  For the last two months, started to notice difficulty writing; cursive mostly; getting progressively worse; left-handed; no fam hx of parkinsons; only lefty in the family; has not noticed any new trouble walking with people; shorter legs, nothing new; no shaking; no trouble with memory  Hepatic cirrhosis; seeing Dr. Allen Norris  Reviewed previous chest CT; coughs because of being a smoker she says; she cannot tolerate nicotine patch; does not want Chantix  COPD; needs refills  Depression screen [facility] 2/9 11/22/2017 11/08/2017 08/25/2017 05/25/2017 02/22/2017  Decreased Interest 0 0 0 0 0  Down, Depressed, Hopeless 0 0 0 0 0  PHQ - 2 Score 0 0 0 0 0  Altered sleeping - 1 - - -  Tired, decreased energy - 1 - - -  Change in appetite - 1 - - -  Feeling bad or failure about yourself  - 0 - - -  Trouble concentrating - 0 - - -  Moving slowly or fidgety/restless - 0 - - -  Suicidal thoughts - 0 - - -  PHQ-9 Score - 3 - - -  Difficult doing work/chores - Somewhat difficult - - -     Relevant past medical, surgical, family and social history reviewed Past Medical History:  Diagnosis Date  . Anemia    polycythemia  . Arthritis    hips/ knees/spine  . COPD (chronic obstructive pulmonary disease) ([place])   . Diabetes mellitus without complication ([place])   . GERD (gastroesophageal reflux disease)    food comes up when bends over  . Hepatitis    h/o tx x 3  . History of hiatal hernia   . History of kidney stones   . Hypertension   . Irritable behavior    gets mad easily  . OAB (overactive bladder)   . Polycythemia   . Renal disorder    Past Surgical History:  Procedure Laterality Date  . CESAREAN SECTION    . CHOLECYSTECTOMY    . COLONOSCOPY N/A 12/12/2014   Procedure: COLONOSCOPY;  Surgeon: Lucilla Lame, MD;  Location: [place];  Service: Gastroenterology;  Laterality: N/A;  . CYSTOSCOPY W/ URETERAL STENT PLACEMENT Left 11/10/2016   Procedure: CYSTOSCOPY WITH STENT REPLACEMENT;  Surgeon: [name], MD;  Location: [facility];  Service: Urology;  Laterality: Left;  . CYSTOSCOPY WITH RETROGRADE PYELOGRAM, URETEROSCOPY AND STENT PLACEMENT Left 10/28/2016  Procedure: CYSTOSCOPY WITH attempted RETROGRADE PYELOGRAM, URETEROSCOPY AND STENT PLACEMENT left;  Surgeon: [name], MD;  Location: [facility];  Service: Urology;  Laterality: Left;  . ESOPHAGOGASTRODUODENOSCOPY N/A 12/12/2014   Procedure: ESOPHAGOGASTRODUODENOSCOPY (EGD);  Surgeon: Lucilla Lame, MD;  Location: [place];  Service: Gastroenterology;  Laterality: N/A;  . GALLBLADDER SURGERY    . POLYPECTOMY  12/12/2014   Procedure: POLYPECTOMY;  Surgeon: Lucilla Lame, MD;  Location: [place];  Service: Gastroenterology;;  . STONE EXTRACTION WITH BASKET Left 11/10/2016   Procedure: STONE EXTRACTION WITH BASKET;  Surgeon: [name], MD;  Location: [facility];  Service: Urology;  Laterality: Left;  . URETEROSCOPY Left 11/10/2016   Procedure: URETEROSCOPY;  Surgeon:  [name], MD;  Location: [facility];  Service: Urology;  Laterality: Left;   Family History  Problem Relation Age of Onset  . Stroke Mother   . Hypertension Mother   . Hypertension Father   . Stroke Father   . Heart disease Sister    Social History   Tobacco Use  . Smoking status: Current Every Day Smoker    Packs/day: 0.50    Years: 40.00    Pack years: 20.00    Types: Cigarettes  . Smokeless tobacco: Never Used  Substance Use Topics  . Alcohol use: Not Currently  . Drug use: Yes    Frequency: 2.0 times per week    Types: Marijuana    Interim medical history since last visit reviewed. Allergies and medications reviewed  Review of Systems Per HPI unless specifically indicated above     Objective:    BP 122/70   Pulse 100   Temp 98.4 F (36.9 C) (Oral)   Resp 14   Ht 4\' 11"  (1.499 m)   Wt 164 lb 12.8 oz (74.8 kg)   SpO2 94%   BMI 33.29 kg/m   Wt Readings from Last 3 Encounters:  11/22/17 164 lb 12.8 oz (74.8 kg)  11/08/17 168 lb 3.2 oz (76.3 kg)  11/03/17 171 lb (77.6 kg)    Physical Exam  Constitutional: She appears well-developed and well-nourished. No distress.  HENT:  Head: Normocephalic and atraumatic.  Eyes: EOM are normal. No scleral icterus.  Neck: No thyromegaly present.  Cardiovascular: Normal rate, regular rhythm and normal heart sounds.  No murmur heard. Pulmonary/Chest: Effort normal and breath sounds normal. No respiratory distress. She has no wheezes.  Abdominal: Soft. Bowel sounds are normal. She exhibits no distension.  Musculoskeletal: Normal range of motion. She exhibits no edema.  Neurological: She is alert. She exhibits normal muscle tone.  Skin: Skin is warm and dry. She is not diaphoretic. No pallor.  Psychiatric: She has a normal mood and affect. Her behavior is normal. Judgment and thought content normal.   Diabetic Foot Form - Detailed   Diabetic Foot Exam - detailed Diabetic Foot exam was performed with the  following findings:  Yes 11/22/2017  2:02 PM  Visual Foot Exam completed.:  Yes  Pulse Foot Exam completed.:  Yes  Sensory Foot Exam Completed.:  Yes Semmes-Weinstein Monofilament Test R Site 1-Great Toe:  Pos L Site 1-Great Toe:  Pos        Results for orders placed or performed in visit on 11/03/17  Bladder Scan (Post Void Residual) in office  Result Value Ref Range   Scan Result 78       Assessment & Plan:   Problem List Items Addressed This Visit      Respiratory  Chronic bronchitis (HCC)   Relevant Medications   budesonide-formoterol (SYMBICORT) 160-4.5 MCG/ACT inhaler   albuterol (PROVENTIL HFA;VENTOLIN HFA) 108 (90 Base) MCG/ACT inhaler   CAFL (chronic airflow limitation) (HCC)    Refilled medicine        Endocrine   Dyslipidemia associated with type 2 diabetes mellitus (HCC) (Chronic)    Check A1c and lipids and urine microalb:Cr today; foot exam by MD today      Relevant Medications   empagliflozin (JARDIANCE) 10 MG TABS tablet   glipiZIDE (GLUCOTROL) 5 MG tablet   rosuvastatin (CRESTOR) 20 MG tablet   Other Relevant Orders   Lipid panel   DM type 2 (diabetes mellitus, type 2) (HCC) - Primary (Chronic)    Check labs      Relevant Medications   empagliflozin (JARDIANCE) 10 MG TABS tablet   glipiZIDE (GLUCOTROL) 5 MG tablet   rosuvastatin (CRESTOR) 20 MG tablet   Other Relevant Orders   Hemoglobin A1C   Urine Microalbumin w/creat. ratio     Other   Erythrocytosis   Relevant Orders   CBC with Differential/Platelet   Tobacco abuse    Encouraged smoking cessation; I am here to help if/when the day comes      Hyperlipidemia    Check lipids today; continue statin      Relevant Medications   rosuvastatin (CRESTOR) 20 MG tablet   Other Relevant Orders   Lipid panel   Elevated hemoglobin (HCC)    Check CBC today; likely due to smoking      Abnormal chest CT    Will be thorough and get follow-up chest CT as recommended in report      Relevant  Orders   CT Chest W Contrast    Other Visit Diagnoses    Worsened handwriting       check labs today; no cogwheeling, but will refer to neurologist if labs are normal   Relevant Orders   TSH   Medication monitoring encounter       Relevant Orders   COMPLETE METABOLIC PANEL WITH GFR   Abnormal findings on diagnostic imaging of other parts of digestive tract       Relevant Orders   CT Chest W Contrast   Controlled type 2 diabetes mellitus without complication, without long-term current use of insulin (HCC)       Relevant Medications   empagliflozin (JARDIANCE) 10 MG TABS tablet   glipiZIDE (GLUCOTROL) 5 MG tablet   rosuvastatin (CRESTOR) 20 MG tablet   Controlled type 2 diabetes mellitus without complication, without long-term current use of insulin (HCC)       Relevant Medications   empagliflozin (JARDIANCE) 10 MG TABS tablet   glipiZIDE (GLUCOTROL) 5 MG tablet   rosuvastatin (CRESTOR) 20 MG tablet       Follow up plan: Return in about 3 months (around 02/22/2018) for follow-up visit with Dr. Sanda Klein.  An after-visit summary was printed and given to the patient at [place].  Please see the patient instructions which may contain other information and recommendations beyond what is mentioned above in the assessment and plan.  Meds ordered this encounter  Medications  . budesonide-formoterol (SYMBICORT) 160-4.5 MCG/ACT inhaler    Sig: INHALE 2 PUFFS BY MOUTH INTO THE LUNGS 2TIMES DAILY    Dispense:  10.2 g    Refill:  5  . albuterol (PROVENTIL HFA;VENTOLIN HFA) 108 (90 Base) MCG/ACT inhaler    Sig: Inhale 2 puffs into the lungs every 6 (six) hours as needed  for wheezing or shortness of breath.    Dispense:  3 Inhaler    Refill:  2  . empagliflozin (JARDIANCE) 10 MG TABS tablet    Sig: Take 10 mg by mouth daily.    Dispense:  90 tablet    Refill:  0  . glipiZIDE (GLUCOTROL) 5 MG tablet    Sig: Take 0.5 tablets (2.5 mg total) by mouth daily before breakfast.    Dispense:  45  tablet    Refill:  0  . rosuvastatin (CRESTOR) 20 MG tablet    Sig: Take one tablet ( 20 mg total ) by mouth daily at bedtime.    Dispense:  90 tablet    Refill:  0    New dosage change. Discontinue 10 mg Rx.    Orders Placed This Encounter  Procedures  . CT Chest W Contrast  . Hemoglobin A1C  . Urine Microalbumin w/creat. ratio  . TSH  . Lipid panel  . COMPLETE METABOLIC PANEL WITH GFR  . CBC with Differential/Platelet

## 2017-11-22 NOTE — Assessment & Plan Note (Addendum)
Check A1c and lipids and urine microalb:Cr today; foot exam by MD today

## 2017-11-22 NOTE — Assessment & Plan Note (Signed)
Refilled medicine.

## 2017-11-22 NOTE — Assessment & Plan Note (Signed)
Will be thorough and get follow-up chest CT as recommended in report

## 2017-11-22 NOTE — Patient Instructions (Addendum)
  I do encourage you to quit smoking Call [phone] to sign up for smoking cessation classes You can call 1-800-QUIT-NOW to talk with a smoking cessation coach  Check out the information at familydoctor.org entitled "Nutrition for Weight Loss: What You Need to Know about Fad Diets" Try to lose between 1-2 pounds per week by taking in fewer calories and burning off more calories You can succeed by limiting portions, limiting foods dense in calories and fat, becoming more active, and drinking 8 glasses of water a day (64 ounces) Don't skip meals, especially breakfast, as skipping meals may alter your metabolism Do not use over-the-counter weight loss pills or gimmicks that claim rapid weight loss A healthy BMI (or body mass index) is between 18.5 and 24.9 You can calculate your ideal BMI at the [place] website [employer]fr

## 2017-11-22 NOTE — Assessment & Plan Note (Signed)
Check labs 

## 2017-11-22 NOTE — Assessment & Plan Note (Signed)
Check lipids today; continue statin 

## 2017-11-22 NOTE — Assessment & Plan Note (Signed)
Check CBC today; likely due to smoking

## 2017-11-22 NOTE — Assessment & Plan Note (Signed)
Encouraged smoking cessation; I am here to help if/when the day comes

## 2017-11-23 LAB — TSH: TSH: 0.9 mIU/L

## 2017-11-23 LAB — COMPLETE METABOLIC PANEL WITH GFR
AG Ratio: 1.5 (calc) (ref 1.0–2.5)
ALKALINE PHOSPHATASE (APISO): 134 U/L — AB (ref 33–130)
ALT: 25 U/L (ref 6–29)
AST: 22 U/L (ref 10–35)
Albumin: 4.3 g/dL (ref 3.6–5.1)
BUN: 14 mg/dL (ref 7–25)
CALCIUM: 9.9 mg/dL (ref 8.6–10.4)
CO2: 28 mmol/L (ref 20–32)
CREATININE: 0.73 mg/dL (ref 0.50–1.05)
Chloride: 105 mmol/L (ref 98–110)
GFR, EST NON AFRICAN AMERICAN: 93 mL/min/{1.73_m2} (ref >=60)
GFR, Est African American: 107 mL/min/{1.73_m2} (ref >=60)
Globulin: 2.8 g/dL (calc) (ref 1.9–3.7)
Glucose, Bld: 139 mg/dL — ABNORMAL HIGH (ref 65–99)
Potassium: 4 mmol/L (ref 3.5–5.3)
Sodium: 143 mmol/L (ref 135–146)
Total Bilirubin: 0.7 mg/dL (ref 0.2–1.2)
Total Protein: 7.1 g/dL (ref 6.1–8.1)

## 2017-11-23 LAB — CBC WITH DIFFERENTIAL/PLATELET
BASOS PCT: 0.4 %
Basophils Absolute: 32 cells/uL (ref 0–200)
EOS ABS: 126 {cells}/uL (ref 15–500)
Eosinophils Relative: 1.6 %
HCT: 46.9 % — ABNORMAL HIGH (ref 35.0–45.0)
HEMOGLOBIN: 16.8 g/dL — AB (ref 11.7–15.5)
LYMPHS ABS: 1904 {cells}/uL (ref 850–3900)
MCH: 32.7 pg (ref 27.0–33.0)
MCHC: 35.8 g/dL (ref 32.0–36.0)
MCV: 91.2 fL (ref 80.0–100.0)
MPV: 10.1 fL (ref 7.5–12.5)
Monocytes Relative: 4.4 %
NEUTROS ABS: 5491 {cells}/uL (ref 1500–7800)
Neutrophils Relative %: 69.5 %
PLATELETS: 289 10*3/uL (ref 140–400)
RBC: 5.14 10*6/uL — AB (ref 3.80–5.10)
RDW: 12.3 % (ref 11.0–15.0)
Total Lymphocyte: 24.1 %
WBC: 7.9 10*3/uL (ref 3.8–10.8)
WBCMIX: 348 {cells}/uL (ref 200–950)

## 2017-11-23 LAB — MICROALBUMIN / CREATININE URINE RATIO
Creatinine, Urine: 397 mg/dL — ABNORMAL HIGH (ref 20–275)
MICROALB UR: 72.5 mg/dL
MICROALB/CREAT RATIO: 183 ug/mg{creat} — AB (ref <30)

## 2017-11-23 LAB — LIPID PANEL
Cholesterol: 231 mg/dL — ABNORMAL HIGH (ref <200)
HDL: 57 mg/dL (ref >50)
LDL Cholesterol (Calc): 137 mg/dL (calc) — ABNORMAL HIGH
NON-HDL CHOLESTEROL (CALC): 174 mg/dL — AB (ref <130)
TRIGLYCERIDES: 228 mg/dL — AB (ref <150)
Total CHOL/HDL Ratio: 4.1 (calc) (ref <5.0)

## 2017-11-23 LAB — HEMOGLOBIN A1C
EAG (MMOL/L): 8.2 (calc)
HEMOGLOBIN A1C: 6.8 %{Hb} — AB (ref <5.7)
MEAN PLASMA GLUCOSE: 148 (calc)

## 2017-12-28 LAB — HM DIABETES EYE EXAM

## 2017-12-30 ENCOUNTER — Encounter: Payer: Self-pay | Admitting: Family Medicine

## 2018-02-02 ENCOUNTER — Encounter: Payer: Self-pay | Admitting: Emergency Medicine

## 2018-02-02 ENCOUNTER — Other Ambulatory Visit: Payer: Self-pay

## 2018-02-02 ENCOUNTER — Emergency Department
Admission: EM | Admit: 2018-02-02 | Discharge: 2018-02-02 | Disposition: A | Discharge status: Home / Self Care | Payer: Medicare HMO | Attending: Emergency Medicine | Admitting: Emergency Medicine

## 2018-02-02 ENCOUNTER — Encounter: Payer: Self-pay | Admitting: Urology

## 2018-02-02 ENCOUNTER — Encounter: Payer: Self-pay | Admitting: Family Medicine

## 2018-02-02 ENCOUNTER — Emergency Department: Payer: Medicare HMO

## 2018-02-02 ENCOUNTER — Ambulatory Visit: Payer: Medicare HMO | Admitting: Urology

## 2018-02-02 ENCOUNTER — Ambulatory Visit (INDEPENDENT_AMBULATORY_CARE_PROVIDER_SITE_OTHER): Payer: Medicare HMO | Admitting: Family Medicine

## 2018-02-02 VITALS — BP 120/80 | HR 90 | Temp 98.3°F | Resp 16 | Ht <= 58 in | Wt 167.5 lb

## 2018-02-02 DIAGNOSIS — E119 Type 2 diabetes mellitus without complications: Secondary | ICD-10-CM | POA: Diagnosis not present

## 2018-02-02 DIAGNOSIS — Z7984 Long term (current) use of oral hypoglycemic drugs: Secondary | ICD-10-CM | POA: Insufficient documentation

## 2018-02-02 DIAGNOSIS — Z79899 Other long term (current) drug therapy: Secondary | ICD-10-CM | POA: Diagnosis not present

## 2018-02-02 DIAGNOSIS — Z7982 Long term (current) use of aspirin: Secondary | ICD-10-CM | POA: Insufficient documentation

## 2018-02-02 DIAGNOSIS — M79641 Pain in right hand: Secondary | ICD-10-CM | POA: Insufficient documentation

## 2018-02-02 DIAGNOSIS — Y999 Unspecified external cause status: Secondary | ICD-10-CM | POA: Diagnosis not present

## 2018-02-02 DIAGNOSIS — Y929 Unspecified place or not applicable: Secondary | ICD-10-CM | POA: Insufficient documentation

## 2018-02-02 DIAGNOSIS — W5501XA Bitten by cat, initial encounter: Secondary | ICD-10-CM

## 2018-02-02 DIAGNOSIS — J449 Chronic obstructive pulmonary disease, unspecified: Secondary | ICD-10-CM | POA: Diagnosis not present

## 2018-02-02 DIAGNOSIS — Y939 Activity, unspecified: Secondary | ICD-10-CM | POA: Insufficient documentation

## 2018-02-02 DIAGNOSIS — L03113 Cellulitis of right upper limb: Secondary | ICD-10-CM

## 2018-02-02 DIAGNOSIS — S61451A Open bite of right hand, initial encounter: Secondary | ICD-10-CM

## 2018-02-02 DIAGNOSIS — F17210 Nicotine dependence, cigarettes, uncomplicated: Secondary | ICD-10-CM | POA: Diagnosis not present

## 2018-02-02 DIAGNOSIS — I10 Essential (primary) hypertension: Secondary | ICD-10-CM | POA: Diagnosis not present

## 2018-02-02 DIAGNOSIS — Z23 Encounter for immunization: Secondary | ICD-10-CM | POA: Diagnosis not present

## 2018-02-02 LAB — CBC WITH DIFFERENTIAL/PLATELET
BASOS PCT: 0 %
Basophils Absolute: 0.1 10*3/uL (ref 0–0.1)
EOS ABS: 0.1 10*3/uL (ref 0–0.7)
EOS PCT: 1 %
HCT: 45.2 % (ref 35.0–47.0)
HEMOGLOBIN: 15.8 g/dL (ref 12.0–16.0)
Lymphocytes Relative: 17 %
Lymphs Abs: 2.5 10*3/uL (ref 1.0–3.6)
MCH: 32.5 pg (ref 26.0–34.0)
MCHC: 34.9 g/dL (ref 32.0–36.0)
MCV: 93.1 fL (ref 80.0–100.0)
MONO ABS: 1.1 10*3/uL — AB (ref 0.2–0.9)
MONOS PCT: 8 %
NEUTROS PCT: 74 %
Neutro Abs: 11.2 10*3/uL — ABNORMAL HIGH (ref 1.4–6.5)
Platelets: 268 10*3/uL (ref 150–440)
RBC: 4.86 MIL/uL (ref 3.80–5.20)
RDW: 13.1 % (ref 11.5–14.5)
WBC: 15 10*3/uL — ABNORMAL HIGH (ref 3.6–11.0)

## 2018-02-02 LAB — BASIC METABOLIC PANEL
Anion gap: 7 (ref 5–15)
BUN: 24 mg/dL — ABNORMAL HIGH (ref 6–20)
CALCIUM: 9.1 mg/dL (ref 8.9–10.3)
CO2: 27 mmol/L (ref 22–32)
CREATININE: 0.56 mg/dL (ref 0.44–1.00)
Chloride: 107 mmol/L (ref 98–111)
GFR calc non Af Amer: >60 mL/min (ref >60)
Glucose, Bld: 100 mg/dL — ABNORMAL HIGH (ref 70–99)
Potassium: 4.6 mmol/L (ref 3.5–5.1)
Sodium: 141 mmol/L (ref 135–145)

## 2018-02-02 MED ORDER — AMOXICILLIN-POT CLAVULANATE 875-125 MG PO TABS: 1.0000 | ORAL_TABLET | Freq: Two times a day (BID) | ORAL | 0 refills | Status: AC

## 2018-02-02 MED ORDER — KETOROLAC TROMETHAMINE 30 MG/ML IJ SOLN
30.0000 mg | Freq: Once | INTRAMUSCULAR | Status: AC
  Administered 2018-02-02: 30 mg via INTRAVENOUS
  Filled 2018-02-02: qty 1

## 2018-02-02 MED ORDER — TRAMADOL HCL 50 MG PO TABS
50.0000 mg | ORAL_TABLET | Freq: Once | ORAL | Status: AC
  Administered 2018-02-02: 50 mg via ORAL
  Filled 2018-02-02: qty 1

## 2018-02-02 MED ORDER — OXYCODONE-ACETAMINOPHEN 7.5-325 MG PO TABS: 1.0000 | ORAL_TABLET | Freq: Four times a day (QID) | ORAL | 0 refills | Status: DC | PRN

## 2018-02-02 MED ORDER — NAPROXEN 500 MG PO TABS: 500.0000 mg | ORAL_TABLET | Freq: Two times a day (BID) | ORAL | Status: DC

## 2018-02-02 MED ORDER — SODIUM CHLORIDE 0.9 % IV SOLN
1.0000 g | Freq: Once | INTRAVENOUS | Status: AC
  Administered 2018-02-02: 1 g via INTRAVENOUS
  Filled 2018-02-02: qty 10

## 2018-02-02 MED ORDER — TRAMADOL HCL 50 MG PO TABS: 50.0000 mg | ORAL_TABLET | Freq: Two times a day (BID) | ORAL | 0 refills | Status: DC | PRN

## 2018-02-02 NOTE — Assessment & Plan Note (Signed)
Pointed out to patient that infection may cause her sugars to go up; be aware; foot exam by MD today

## 2018-02-02 NOTE — ED Triage Notes (Signed)
States she was bitten by her cat to right thumb area  Redness and swelling noted to right thumb and states pain is moving into hand

## 2018-02-02 NOTE — ED Provider Notes (Signed)
[facility] Emergency Department Provider Note   ____________________________________________   First MD Initiated Contact with Patient 02/02/18 1354     (approximate)  I have reviewed the triage vital signs and the nursing notes.   HISTORY  Chief Complaint No chief complaint on file.    HPI Tara Cochran is a 57 y.o. female patient presents with edema, erythema and pain to the left thumb after being bitten by a cat last night.  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  Patient state animal shots are up-to-date.  Patient had tetanus shot is not up-to-date.  Patient states she was seen by her PCP this morning and given tetanus shot prior to arrival to the ED.  PCP is concerned because of the edema and concern for tenosynovitis.  Patient state pain increased with movement of the thumb.  Patient rates pain as a 9/10.  Patient described the pain is "achy".  Patient denies loss of sensation.  No other palliative measures for complaint. Past Medical History:  Diagnosis Date  . Anemia    polycythemia  . Arthritis    hips/ knees/spine  . COPD (chronic obstructive pulmonary disease) ([place])   . Diabetes mellitus without complication ([place])   . GERD (gastroesophageal reflux disease)    food comes up when bends over  . Hepatitis    h/o tx x 3  . History of hiatal hernia   . History of kidney stones   . Hypertension   . Irritable behavior    gets mad easily  . OAB (overactive bladder)   . Polycythemia   . Renal disorder     Patient Active Problem List   Diagnosis Date Noted  . Tobacco abuse 11/22/2017  . Carotid atherosclerosis, bilateral 09/02/2017  . DM type 2 (diabetes mellitus, type 2) ([place]) 08/25/2017  . Hyperlipidemia 05/05/2016  . Elevated alkaline phosphatase level 11/04/2015  . Urinary urgency 08/07/2015  . Hypertension 08/07/2015  . Abnormal chest CT 05/26/2015  . Chronic radicular low back pain 02/18/2015  . Chronic bronchitis ([place]) 02/04/2015  .  Dyslipidemia associated with type 2 diabetes mellitus ([place]) 02/04/2015  . Elevated hemoglobin ([place]) 12/19/2014  . Duodenitis   . Gastritis with hemorrhage   . Reflux esophagitis   . Rectal polyp   . H/O arthritis 12/02/2014  . Airway hyperreactivity 12/02/2014  . Backache 12/02/2014  . Barrett esophagus 12/02/2014  . CAFL (chronic airflow limitation) ([place]) 12/02/2014  . Depression 12/02/2014  . Chronic hepatitis C ([place]) 12/02/2014  . H/O: HTN (hypertension) 12/02/2014  . Diarrhea 12/02/2014  . Erythrocytosis 01/15/2013    Past Surgical History:  Procedure Laterality Date  . CESAREAN SECTION    . CHOLECYSTECTOMY    . COLONOSCOPY N/A 12/12/2014   Procedure: COLONOSCOPY;  Surgeon: Lucilla Lame, MD;  Location: [place];  Service: Gastroenterology;  Laterality: N/A;  . CYSTOSCOPY W/ URETERAL STENT PLACEMENT Left 11/10/2016   Procedure: CYSTOSCOPY WITH STENT REPLACEMENT;  Surgeon: [name], MD;  Location: [facility];  Service: Urology;  Laterality: Left;  . CYSTOSCOPY WITH RETROGRADE PYELOGRAM, URETEROSCOPY AND STENT PLACEMENT Left 10/28/2016   Procedure: CYSTOSCOPY WITH attempted RETROGRADE PYELOGRAM, URETEROSCOPY AND STENT PLACEMENT left;  Surgeon: [name], MD;  Location: [facility];  Service: Urology;  Laterality: Left;  . ESOPHAGOGASTRODUODENOSCOPY N/A 12/12/2014   Procedure: ESOPHAGOGASTRODUODENOSCOPY (EGD);  Surgeon: Lucilla Lame, MD;  Location: [place];  Service: Gastroenterology;  Laterality: N/A;  . GALLBLADDER SURGERY    . POLYPECTOMY  12/12/2014  Procedure: POLYPECTOMY;  Surgeon: Lucilla Lame, MD;  Location: [place];  Service: Gastroenterology;;  . STONE EXTRACTION WITH BASKET Left 11/10/2016   Procedure: STONE EXTRACTION WITH BASKET;  Surgeon: [name], MD;  Location: [facility];  Service: Urology;  Laterality: Left;  . URETEROSCOPY Left 11/10/2016   Procedure: URETEROSCOPY;  Surgeon: [name], MD;  Location:  [facility];  Service: Urology;  Laterality: Left;    Prior to Admission medications   Medication Sig Start Date End Date Taking? Authorizing Provider  albuterol (PROVENTIL HFA;VENTOLIN HFA) 108 (90 Base) MCG/ACT inhaler Inhale 2 puffs into the lungs every 6 (six) hours as needed for wheezing or shortness of breath. 11/22/17   Lada, Satira Anis, MD  amoxicillin-clavulanate (AUGMENTIN) 875-125 MG tablet Take 1 tablet by mouth 2 (two) times daily for 10 days. 02/02/18 02/12/18  Sable Feil, PA-C  aspirin EC 81 MG tablet Take 1 tablet (81 mg total) by mouth daily. 09/02/17   Lada, Satira Anis, MD  budesonide-formoterol (SYMBICORT) 160-4.5 MCG/ACT inhaler INHALE 2 PUFFS BY MOUTH INTO THE LUNGS 2TIMES DAILY 11/22/17   Lada, Satira Anis, MD  empagliflozin (JARDIANCE) 10 MG TABS tablet Take 10 mg by mouth daily. 11/22/17   Arnetha Courser, MD  glipiZIDE (GLUCOTROL) 5 MG tablet Take 0.5 tablets (2.5 mg total) by mouth daily before breakfast. 11/22/17   Lada, Satira Anis, MD  Incontinence Supply Disposable (BLADDER CONTROL PADS EX ABSORB) MISC daily. 08/27/14   [provider]  losartan (COZAAR) 100 MG tablet Take 1 tablet (100 mg total) by mouth daily. 10/21/17   Arnetha Courser, MD  mirabegron ER (MYRBETRIQ) 25 MG TB24 tablet Take 1 tablet (25 mg total) by mouth daily. 11/03/17   [name], MD  Multiple Vitamins-Minerals (WOMENS MULTIVITAMIN PLUS PO) Take 1 tablet by mouth daily.  08/27/14   [provider]  naproxen (NAPROSYN) 500 MG tablet Take 1 tablet (500 mg total) by mouth 2 (two) times daily with a meal. 02/02/18   Sable Feil, PA-C  PARoxetine (PAXIL-CR) 37.5 MG 24 hr tablet Take 1 tablet (37.5 mg total) by mouth daily. 10/21/17   Arnetha Courser, MD  rosuvastatin (CRESTOR) 20 MG tablet Take one tablet ( 20 mg total ) by mouth daily at bedtime. 11/22/17   Lada, Satira Anis, MD  traMADol (ULTRAM) 50 MG tablet Take 1 tablet (50 mg total) by mouth every 12 (twelve) hours as needed. 02/02/18   Sable Feil, PA-C    Allergies Morphine sulfate  Family History  Problem Relation Age of Onset  . Stroke Mother   . Hypertension Mother   . Hypertension Father   . Stroke Father   . Heart disease Sister     Social History Social History   Tobacco Use  . Smoking status: Current Every Day Smoker    Packs/day: 0.50    Years: 40.00    Pack years: 20.00    Types: Cigarettes  . Smokeless tobacco: Never Used  Substance Use Topics  . Alcohol use: Not Currently  . Drug use: Yes    Frequency: 2.0 times per week    Types: Marijuana    Review of Systems Constitutional: No fever/chills Eyes: No visual changes. ENT: No sore throat. Cardiovascular: Denies chest pain. Respiratory: Denies shortness of breath. Gastrointestinal: No abdominal pain.  No nausea, no vomiting.  No diarrhea.  No constipation. Genitourinary: Negative for dysuria. Musculoskeletal: Negative for back pain. Skin: Negative for rash.  Bites right  hand Neurological: Negative for headaches, focal weakness or numbness. Endocrine: Diabetes and hypertension Hematological/Lymphatic: Allergic/Immunilogical: Tramadol  ____________________________________________   PHYSICAL EXAM:  VITAL SIGNS: ED Triage Vitals [02/02/18 1355]  Enc Vitals Group     BP      Pulse      Resp      Temp      Temp src      SpO2      Weight      Height      Head Circumference      Peak Flow      Pain Score 9     Pain Loc      Pain Edu?      Excl. in [place]?    Constitutional: Alert and oriented. Well appearing and in no acute distress. Cardiovascular: Normal rate, regular rhythm. Grossly normal heart sounds.  Good peripheral circulation. Respiratory: Normal respiratory effort.  No retractions. Lungs CTAB. Neurologic:  Normal speech and language. No gross focal neurologic deficits are appreciated. No gait instability. Skin: Skin is erythematous and edematous.  Multiple puncture wounds palmar aspect of the first metacarpal.   Psychiatric: Mood and affect are normal. Speech and behavior are normal.  ____________________________________________   LABS (all labs ordered are listed, but only abnormal results are displayed)  Labs Reviewed  BASIC METABOLIC PANEL - Abnormal; Notable for the following components:      Result Value   Glucose, Bld 100 (*)    BUN 24 (*)    All other components within normal limits  CBC WITH DIFFERENTIAL/PLATELET - Abnormal; Notable for the following components:   WBC 15.0 (*)    Neutro Abs 11.2 (*)    Monocytes Absolute 1.1 (*)    All other components within normal limits  CULTURE, BLOOD (ROUTINE X 2)  CULTURE, BLOOD (ROUTINE X 2)   ____________________________________________  EKG   ____________________________________________  RADIOLOGY  ED MD interpretation:    Official radiology report(s): Dg Hand Complete Right  Result Date: 02/02/2018 CLINICAL DATA:  Acute RIGHT hand pain and swelling following cat bite. EXAM: RIGHT HAND - COMPLETE 3+ VIEW COMPARISON:  None. FINDINGS: No acute fracture, subluxation or dislocation. No radiographic evidence of osteomyelitis noted. No soft tissue gas or radiopaque foreign body noted. IMPRESSION: No acute bony abnormality. Electronically Signed   By: Margarette Canada M.D.   On: 02/02/2018 15:25    ____________________________________________   PROCEDURES  Procedure(s) performed: None  Procedures  Critical Care performed: No  ____________________________________________   INITIAL IMPRESSION / ASSESSMENT AND PLAN / ED COURSE  As part of my medical decision making, I reviewed the following data within the electronic MEDICAL RECORD NUMBER   Cellulitis right hand secondary to cat bite.  Patient is continues to be able to move extremity with complaint of pain.  Discussed negative x-ray results with patient.  Discussed lab results with patient.  Patient given Rocephin 1 g IV and will be followed by Augmentin and naproxen twice daily.  Patient  return back tomorrow for reevaluation due to a concern of developing tenosynovitis.  Skin marking was placed surrounding area erythema and will be reevaluated tomorrow.  Patient voiced understanding to take antibiotics as directed follow-up visit tomorrow.   ____________________________________________   FINAL CLINICAL IMPRESSION(S) / ED DIAGNOSES  Final diagnoses:  Cat bite of right hand, initial encounter     ED Discharge Orders         Ordered    amoxicillin-clavulanate (AUGMENTIN) 875-125 MG tablet  2 times daily  02/02/18 1622    naproxen (NAPROSYN) 500 MG tablet  2 times daily with meals     02/02/18 1622    traMADol (ULTRAM) 50 MG tablet  Every 12 hours PRN     02/02/18 1625           Note:  This document was prepared using Dragon voice recognition software and may include unintentional dictation errors.    Sable Feil, PA-C 02/02/18 1634    Darel Hong, MD 02/02/18 2103

## 2018-02-02 NOTE — Progress Notes (Signed)
BP 120/80 (BP Location: Left Arm, Patient Position: Sitting, Cuff Size: Normal)   Pulse 90   Temp 98.3 F (36.8 C) (Oral)   Resp 16   Ht 4\' 10"  (1.473 m)   Wt 167 lb 8 oz (76 kg)   SpO2 97%   BMI 35.01 kg/m    Subjective:    Patient ID: Tara Cochran, female    DOB: 28-Oct-1960, 57 y.o.   MRN: [id number]  HPI: Tara Cochran is a 57 y.o. female  Chief Complaint  Patient presents with  . Animal Bite    HPI  Main complaint: Patient presents with right hand swelling from cat bite last night. Cat's vaccines are up to date; rabies vaccine was last summer. States she was holding cat and touched a sore spot on the cat and it bit her right palm. She washed it out with soap and water and pushed on the area to get it to bleed more.  She is not sure if the cat's tooth hit bone on the distal two puncture sites on the thumb No fevers Tetanus is NOT up-to-date She has diabetes, controlled in May Lab Results  Component Value Date   HGBA1C 6.8 (H) 11/22/2017     Different problem: She was stung on the left elbow by a hornet about a month ago; remains tender to the touch and with straightening the left elbow; she is left-handed  Depression screen [facility] 2/9 11/22/2017 11/08/2017 08/25/2017 05/25/2017 02/22/2017  Decreased Interest 0 0 0 0 0  Down, Depressed, Hopeless 0 0 0 0 0  PHQ - 2 Score 0 0 0 0 0  Altered sleeping - 1 - - -  Tired, decreased energy - 1 - - -  Change in appetite - 1 - - -  Feeling bad or failure about yourself  - 0 - - -  Trouble concentrating - 0 - - -  Moving slowly or fidgety/restless - 0 - - -  Suicidal thoughts - 0 - - -  PHQ-9 Score - 3 - - -  Difficult doing work/chores - Somewhat difficult - - -    Relevant past medical, surgical, family and social history reviewed Past Medical History:  Diagnosis Date  . Anemia    polycythemia  . Arthritis    hips/ knees/spine  . COPD (chronic obstructive pulmonary disease) ([place])   . Diabetes mellitus  without complication ([place])   . GERD (gastroesophageal reflux disease)    food comes up when bends over  . Hepatitis    h/o tx x 3  . History of hiatal hernia   . History of kidney stones   . Hypertension   . Irritable behavior    gets mad easily  . OAB (overactive bladder)   . Polycythemia   . Renal disorder    Past Surgical History:  Procedure Laterality Date  . CESAREAN SECTION    . CHOLECYSTECTOMY    . COLONOSCOPY N/A 12/12/2014   Procedure: COLONOSCOPY;  Surgeon: Lucilla Lame, MD;  Location: [place];  Service: Gastroenterology;  Laterality: N/A;  . CYSTOSCOPY W/ URETERAL STENT PLACEMENT Left 11/10/2016   Procedure: CYSTOSCOPY WITH STENT REPLACEMENT;  Surgeon: [name], MD;  Location: [facility];  Service: Urology;  Laterality: Left;  . CYSTOSCOPY WITH RETROGRADE PYELOGRAM, URETEROSCOPY AND STENT PLACEMENT Left 10/28/2016   Procedure: CYSTOSCOPY WITH attempted RETROGRADE PYELOGRAM, URETEROSCOPY AND STENT PLACEMENT left;  Surgeon: [name], MD;  Location: [facility];  Service: Urology;  Laterality:  Left;  . ESOPHAGOGASTRODUODENOSCOPY N/A 12/12/2014   Procedure: ESOPHAGOGASTRODUODENOSCOPY (EGD);  Surgeon: Lucilla Lame, MD;  Location: [place];  Service: Gastroenterology;  Laterality: N/A;  . GALLBLADDER SURGERY    . POLYPECTOMY  12/12/2014   Procedure: POLYPECTOMY;  Surgeon: Lucilla Lame, MD;  Location: [place];  Service: Gastroenterology;;  . STONE EXTRACTION WITH BASKET Left 11/10/2016   Procedure: STONE EXTRACTION WITH BASKET;  Surgeon: [name], MD;  Location: [facility];  Service: Urology;  Laterality: Left;  . URETEROSCOPY Left 11/10/2016   Procedure: URETEROSCOPY;  Surgeon: [name], MD;  Location: [facility];  Service: Urology;  Laterality: Left;   Family History  Problem Relation Age of Onset  . Stroke Mother   . Hypertension Mother   . Hypertension Father   . Stroke Father   . Heart disease Sister     Social History   Tobacco Use  . Smoking status: Current Every Day Smoker    Packs/day: 0.50    Years: 40.00    Pack years: 20.00    Types: Cigarettes  . Smokeless tobacco: Never Used  Substance Use Topics  . Alcohol use: Not Currently  . Drug use: Yes    Frequency: 2.0 times per week    Types: Marijuana    Interim medical history since last visit reviewed. Allergies and medications reviewed  Review of Systems Per HPI unless specifically indicated above     Objective:    BP 120/80 (BP Location: Left Arm, Patient Position: Sitting, Cuff Size: Normal)   Pulse 90   Temp 98.3 F (36.8 C) (Oral)   Resp 16   Ht 4\' 10"  (1.473 m)   Wt 167 lb 8 oz (76 kg)   SpO2 97%   BMI 35.01 kg/m   Wt Readings from Last 3 Encounters:  02/02/18 167 lb 8 oz (76 kg)  11/22/17 164 lb 12.8 oz (74.8 kg)  11/08/17 168 lb 3.2 oz (76.3 kg)    Physical Exam  Constitutional: She appears well-developed and well-nourished.  HENT:  Mouth/Throat: Mucous membranes are normal.  Eyes: EOM are normal. No scleral icterus.  Cardiovascular: Normal rate and regular rhythm.  Pulmonary/Chest: Effort normal and breath sounds normal.  Musculoskeletal:       Left forearm: She exhibits tenderness. She exhibits no bony tenderness, no swelling and no deformity.       Hands: Four discrete puncture / bite marks on the right thumb There is significant swelling and tenderness and heat over this entire area  Lymphadenopathy:  No palpable epitrochlear nodes on the right  Neurological:  Neurovascularly intact, sensation intact distal right thumb to light touch  Psychiatric: She has a normal mood and affect. Her behavior is normal.   Diabetic Foot Form - Detailed   Diabetic Foot Exam - detailed Diabetic Foot exam was performed with the following findings:  Yes 02/02/2018 12:49 PM  Visual Foot Exam completed.:  Yes  Pulse Foot Exam completed.:  Yes  Right Dorsalis Pedis:  Present Left Dorsalis Pedis:  Present   Sensory Foot Exam Completed.:  Yes Semmes-Weinstein Monofilament Test R Site 1-Great Toe:  Pos L Site 1-Great Toe:  Pos         Results for orders placed or performed in visit on 12/30/17  HM DIABETES EYE EXAM  Result Value Ref Range   HM Diabetic Eye Exam No Retinopathy No Retinopathy      Assessment & Plan:   Problem List Items Addressed This Visit  Endocrine   DM type 2 (diabetes mellitus, type 2) ([place]) (Chronic)    Pointed out to patient that infection may cause her sugars to go up; be aware; foot exam by MD today       Other Visit Diagnoses    Cat bite of right hand, initial encounter    -  Primary   very concerning; will refer now to the ER; may need irrigation, IV antibiotics   Relevant Orders   Tdap vaccine greater than or equal to 7yo IM (Completed)   Cellulitis of right hand       sending to ER for evaluation, possible irrigation, IV antibiotics; discussed Pasturella multocida, risk of serious infection   Need for Tdap vaccination       Relevant Orders   Tdap vaccine greater than or equal to 7yo IM (Completed)       Follow up plan: No follow-ups on file.  An after-visit summary was printed and given to the patient at [place].  Please see the patient instructions which may contain other information and recommendations beyond what is mentioned above in the assessment and plan.  No orders of the defined types were placed in this encounter.   Orders Placed This Encounter  Procedures  . Tdap vaccine greater than or equal to 7yo IM

## 2018-02-02 NOTE — Discharge Instructions (Addendum)
Return tomorrow morning between 10 and 12:00 for wound recheck with Randel Pigg PA-C  At that time you will be reassessed and if necessary orthopedics will be called.  Take medications as directed.

## 2018-02-02 NOTE — Patient Instructions (Addendum)
Please do go to the emergency room to have them look at your hand It may need to be irrigated and you may need IV antibiotics You did receive a tetanus booster today, and you will not need another for up to 10 years

## 2018-02-03 ENCOUNTER — Emergency Department
Admission: EM | Admit: 2018-02-03 | Discharge: 2018-02-03 | Disposition: A | Discharge status: Home / Self Care | Payer: Medicare HMO | Attending: Emergency Medicine | Admitting: Emergency Medicine

## 2018-02-03 ENCOUNTER — Other Ambulatory Visit: Payer: Self-pay

## 2018-02-03 DIAGNOSIS — Z7984 Long term (current) use of oral hypoglycemic drugs: Secondary | ICD-10-CM | POA: Diagnosis not present

## 2018-02-03 DIAGNOSIS — Z5189 Encounter for other specified aftercare: Secondary | ICD-10-CM | POA: Diagnosis present

## 2018-02-03 DIAGNOSIS — J449 Chronic obstructive pulmonary disease, unspecified: Secondary | ICD-10-CM | POA: Diagnosis not present

## 2018-02-03 DIAGNOSIS — Z79899 Other long term (current) drug therapy: Secondary | ICD-10-CM | POA: Insufficient documentation

## 2018-02-03 DIAGNOSIS — Z7982 Long term (current) use of aspirin: Secondary | ICD-10-CM | POA: Insufficient documentation

## 2018-02-03 DIAGNOSIS — F17210 Nicotine dependence, cigarettes, uncomplicated: Secondary | ICD-10-CM | POA: Insufficient documentation

## 2018-02-03 DIAGNOSIS — W5501XD Bitten by cat, subsequent encounter: Secondary | ICD-10-CM | POA: Diagnosis not present

## 2018-02-03 DIAGNOSIS — E119 Type 2 diabetes mellitus without complications: Secondary | ICD-10-CM | POA: Diagnosis not present

## 2018-02-03 DIAGNOSIS — I10 Essential (primary) hypertension: Secondary | ICD-10-CM | POA: Diagnosis not present

## 2018-02-03 LAB — CBC
HEMATOCRIT: 45 % (ref 35.0–47.0)
Hemoglobin: 15.5 g/dL (ref 12.0–16.0)
MCH: 32.3 pg (ref 26.0–34.0)
MCHC: 34.4 g/dL (ref 32.0–36.0)
MCV: 94 fL (ref 80.0–100.0)
PLATELETS: 231 10*3/uL (ref 150–440)
RBC: 4.78 MIL/uL (ref 3.80–5.20)
RDW: 13.1 % (ref 11.5–14.5)
WBC: 10.4 10*3/uL (ref 3.6–11.0)

## 2018-02-03 NOTE — ED Provider Notes (Signed)
[facility] Emergency Department Provider Note   ____________________________________________   First MD Initiated Contact with Patient 02/03/18 1103     (approximate)  I have reviewed the triage vital signs and the nursing notes.   HISTORY  Chief Complaint Wound Check    HPI Tara Cochran is a 56 y.o. female patient here for wound check secondary to a cat bite to the palmar aspect of the right hand 2 days ago.  Patient was seen in the ED yesterday.  Patient for continued pain but no increased redness past the skin marking placed yesterday.  Patient is taken 2 doses of Augmentin and naproxen since her departure.  Pain is controlled with Percocets.  Patient states she is able to move her thumb easier today than yesterday.  Patient still rates the pain as 8/10.  Patient is right-hand dominant.  Past Medical History:  Diagnosis Date  . Anemia    polycythemia  . Arthritis    hips/ knees/spine  . COPD (chronic obstructive pulmonary disease) ([place])   . Diabetes mellitus without complication ([place])   . GERD (gastroesophageal reflux disease)    food comes up when bends over  . Hepatitis    h/o tx x 3  . History of hiatal hernia   . History of kidney stones   . Hypertension   . Irritable behavior    gets mad easily  . OAB (overactive bladder)   . Polycythemia   . Renal disorder     Patient Active Problem List   Diagnosis Date Noted  . Tobacco abuse 11/22/2017  . Carotid atherosclerosis, bilateral 09/02/2017  . DM type 2 (diabetes mellitus, type 2) ([place]) 08/25/2017  . Hyperlipidemia 05/05/2016  . Elevated alkaline phosphatase level 11/04/2015  . Urinary urgency 08/07/2015  . Hypertension 08/07/2015  . Abnormal chest CT 05/26/2015  . Chronic radicular low back pain 02/18/2015  . Chronic bronchitis ([place]) 02/04/2015  . Dyslipidemia associated with type 2 diabetes mellitus ([place]) 02/04/2015  . Elevated hemoglobin ([place]) 12/19/2014  . Duodenitis    . Gastritis with hemorrhage   . Reflux esophagitis   . Rectal polyp   . H/O arthritis 12/02/2014  . Airway hyperreactivity 12/02/2014  . Backache 12/02/2014  . Barrett esophagus 12/02/2014  . CAFL (chronic airflow limitation) ([place]) 12/02/2014  . Depression 12/02/2014  . Chronic hepatitis C ([place]) 12/02/2014  . H/O: HTN (hypertension) 12/02/2014  . Diarrhea 12/02/2014  . Erythrocytosis 01/15/2013    Past Surgical History:  Procedure Laterality Date  . CESAREAN SECTION    . CHOLECYSTECTOMY    . COLONOSCOPY N/A 12/12/2014   Procedure: COLONOSCOPY;  Surgeon: Lucilla Lame, MD;  Location: [place];  Service: Gastroenterology;  Laterality: N/A;  . CYSTOSCOPY W/ URETERAL STENT PLACEMENT Left 11/10/2016   Procedure: CYSTOSCOPY WITH STENT REPLACEMENT;  Surgeon: [name], MD;  Location: [facility];  Service: Urology;  Laterality: Left;  . CYSTOSCOPY WITH RETROGRADE PYELOGRAM, URETEROSCOPY AND STENT PLACEMENT Left 10/28/2016   Procedure: CYSTOSCOPY WITH attempted RETROGRADE PYELOGRAM, URETEROSCOPY AND STENT PLACEMENT left;  Surgeon: [name], MD;  Location: [facility];  Service: Urology;  Laterality: Left;  . ESOPHAGOGASTRODUODENOSCOPY N/A 12/12/2014   Procedure: ESOPHAGOGASTRODUODENOSCOPY (EGD);  Surgeon: Lucilla Lame, MD;  Location: [place];  Service: Gastroenterology;  Laterality: N/A;  . GALLBLADDER SURGERY    . POLYPECTOMY  12/12/2014   Procedure: POLYPECTOMY;  Surgeon: Lucilla Lame, MD;  Location: [place];  Service: Gastroenterology;;  . STONE EXTRACTION WITH BASKET Left  11/10/2016   Procedure: STONE EXTRACTION WITH BASKET;  Surgeon: [name], MD;  Location: [facility];  Service: Urology;  Laterality: Left;  . URETEROSCOPY Left 11/10/2016   Procedure: URETEROSCOPY;  Surgeon: [name], MD;  Location: [facility];  Service: Urology;  Laterality: Left;    Prior to Admission medications   Medication Sig Start Date End Date Taking?  Authorizing Provider  albuterol (PROVENTIL HFA;VENTOLIN HFA) 108 (90 Base) MCG/ACT inhaler Inhale 2 puffs into the lungs every 6 (six) hours as needed for wheezing or shortness of breath. 11/22/17   Lada, Satira Anis, MD  amoxicillin-clavulanate (AUGMENTIN) 875-125 MG tablet Take 1 tablet by mouth 2 (two) times daily for 10 days. 02/02/18 02/12/18  Sable Feil, PA-C  aspirin EC 81 MG tablet Take 1 tablet (81 mg total) by mouth daily. 09/02/17   Lada, Satira Anis, MD  budesonide-formoterol (SYMBICORT) 160-4.5 MCG/ACT inhaler INHALE 2 PUFFS BY MOUTH INTO THE LUNGS 2TIMES DAILY 11/22/17   Lada, Satira Anis, MD  empagliflozin (JARDIANCE) 10 MG TABS tablet Take 10 mg by mouth daily. 11/22/17   Arnetha Courser, MD  glipiZIDE (GLUCOTROL) 5 MG tablet Take 0.5 tablets (2.5 mg total) by mouth daily before breakfast. 11/22/17   Lada, Satira Anis, MD  Incontinence Supply Disposable (BLADDER CONTROL PADS EX ABSORB) MISC daily. 08/27/14   [provider]  losartan (COZAAR) 100 MG tablet Take 1 tablet (100 mg total) by mouth daily. 10/21/17   Arnetha Courser, MD  mirabegron ER (MYRBETRIQ) 25 MG TB24 tablet Take 1 tablet (25 mg total) by mouth daily. 11/03/17   [name], MD  Multiple Vitamins-Minerals (WOMENS MULTIVITAMIN PLUS PO) Take 1 tablet by mouth daily.  08/27/14   [provider]  naproxen (NAPROSYN) 500 MG tablet Take 1 tablet (500 mg total) by mouth 2 (two) times daily with a meal. 02/02/18   Sable Feil, PA-C  oxyCODONE-acetaminophen (PERCOCET) 7.5-325 MG tablet Take 1 tablet by mouth every 6 (six) hours as needed for severe pain. 02/02/18   Sable Feil, PA-C  PARoxetine (PAXIL-CR) 37.5 MG 24 hr tablet Take 1 tablet (37.5 mg total) by mouth daily. 10/21/17   Arnetha Courser, MD  rosuvastatin (CRESTOR) 20 MG tablet Take one tablet ( 20 mg total ) by mouth daily at bedtime. 11/22/17   Arnetha Courser, MD    Allergies Morphine sulfate  Family History  Problem Relation Age of Onset  . Stroke  Mother   . Hypertension Mother   . Hypertension Father   . Stroke Father   . Heart disease Sister     Social History Social History   Tobacco Use  . Smoking status: Current Every Day Smoker    Packs/day: 0.50    Years: 40.00    Pack years: 20.00    Types: Cigarettes  . Smokeless tobacco: Never Used  Substance Use Topics  . Alcohol use: Not Currently  . Drug use: Yes    Frequency: 2.0 times per week    Types: Marijuana    Review of Systems Constitutional: No fever/chills Eyes: No visual changes. ENT: No sore throat. Cardiovascular: Denies chest pain. Respiratory: Denies shortness of breath. Gastrointestinal: No abdominal pain.  No nausea, no vomiting.  No diarrhea.  No constipation. Genitourinary: Negative for dysuria. Musculoskeletal: Negative for back pain. Skin: Negative for rash.  Edema erythema to the palmar aspect of the first metacarpal. Neurological: Negative for headaches, focal weakness or numbness. Endocrine:Diabetes and hypertension. Allergic/Immunilogical: Morphine. ____________________________________________  PHYSICAL EXAM:  VITAL SIGNS: ED Triage Vitals  Enc Vitals Group     BP 02/03/18 1052 130/76     Pulse Rate 02/03/18 1052 80     Resp 02/03/18 1052 16     Temp 02/03/18 1052 98.2 F (36.8 C)     Temp Source 02/03/18 1052 Oral     SpO2 02/03/18 1052 98 %     Weight 02/03/18 1053 167 lb 8.8 oz (76 kg)     Height 02/03/18 1053 4\' 10"  (1.473 m)     Head Circumference --      Peak Flow --      Pain Score 02/03/18 1053 8     Pain Loc --      Pain Edu? --      Excl. in [place]? --    Constitutional: Alert and oriented. Well appearing and in no acute distress. Cardiovascular: Normal rate, regular rhythm. Grossly normal heart sounds.  Good peripheral circulation. Respiratory: Normal respiratory effort.  No retractions. Lungs CTAB. Gastrointestinal: Soft and nontender. No distention. No abdominal bruits. No CVA tenderness. Musculoskeletal: No  lower extremity tenderness nor edema.  No joint effusions. Neurologic:  Normal speech and language. No gross focal neurologic deficits are appreciated. No gait instability. Skin:  Skin is warm, dry and intact. No rash noted.  Edema and erythema to the palmar aspect of the first metacarpal right hand. Psychiatric: Mood and affect are normal. Speech and behavior are normal.  ____________________________________________   LABS (all labs ordered are listed, but only abnormal results are displayed)  Labs Reviewed  CBC   ____________________________________________  EKG   ____________________________________________  RADIOLOGY  ED MD interpretation:    Official radiology report(s): Dg Hand Complete Right  Result Date: 02/02/2018 CLINICAL DATA:  Acute RIGHT hand pain and swelling following cat bite. EXAM: RIGHT HAND - COMPLETE 3+ VIEW COMPARISON:  None. FINDINGS: No acute fracture, subluxation or dislocation. No radiographic evidence of osteomyelitis noted. No soft tissue gas or radiopaque foreign body noted. IMPRESSION: No acute bony abnormality. Electronically Signed   By: Margarette Canada M.D.   On: 02/02/2018 15:25    ____________________________________________   PROCEDURES  Procedure(s) performed: None  Procedures  Critical Care performed: No  ____________________________________________   INITIAL IMPRESSION / ASSESSMENT AND PLAN / ED COURSE  As part of my medical decision making, I reviewed the following data within the [place]    Patient presents for recheck secondary to cat bite of the right hand.  Patient has decreased edema and erythema.  Patient also states that she has increased use with flexion extension of her right thumb.  Discussed repeat labs results shown to decrease her white blood cell phone yesterday.  Patient blood culture was negative x1.  Patient advised continue previous medications and may follow orthopedic if she develop any  complication with flexion extension or increased pain of her right thumb.      ____________________________________________   FINAL CLINICAL IMPRESSION(S) / ED DIAGNOSES  Final diagnoses:  Visit for wound check  Cat bite, subsequent encounter     ED Discharge Orders    None       Note:  This document was prepared using Dragon voice recognition software and may include unintentional dictation errors.    Sable Feil, PA-C 02/03/18 1216    Harvest Dark, MD 02/03/18 [phone]

## 2018-02-03 NOTE — ED Triage Notes (Signed)
First nurse note: pt states that she was told to come back today for a re-check, states that the swelling has went down tremendously, and was told to come back today

## 2018-02-03 NOTE — ED Notes (Signed)

## 2018-02-03 NOTE — Discharge Instructions (Signed)
Continue previous medication.  Follow up with orthopedics if you develop any complication with flexion /extension of your thumb.

## 2018-02-03 NOTE — ED Triage Notes (Signed)
Pt states she is here for a wound check of the right wrist from a cat bite.

## 2018-02-03 NOTE — ED Notes (Signed)
Blood draw left ac

## 2018-02-07 LAB — CULTURE, BLOOD (ROUTINE X 2)
CULTURE: NO GROWTH
Culture: NO GROWTH
Special Requests: ADEQUATE

## 2018-02-16 ENCOUNTER — Other Ambulatory Visit: Payer: Self-pay | Admitting: Family Medicine

## 2018-02-16 DIAGNOSIS — F3341 Major depressive disorder, recurrent, in partial remission: Secondary | ICD-10-CM

## 2018-03-07 ENCOUNTER — Ambulatory Visit (INDEPENDENT_AMBULATORY_CARE_PROVIDER_SITE_OTHER): Payer: Medicare HMO | Admitting: Family Medicine

## 2018-03-07 ENCOUNTER — Encounter: Payer: Self-pay | Admitting: Family Medicine

## 2018-03-07 VITALS — BP 116/76 | HR 62 | Temp 98.5°F | Ht <= 58 in | Wt 163.7 lb

## 2018-03-07 DIAGNOSIS — J42 Unspecified chronic bronchitis: Secondary | ICD-10-CM | POA: Diagnosis not present

## 2018-03-07 DIAGNOSIS — F3341 Major depressive disorder, recurrent, in partial remission: Secondary | ICD-10-CM

## 2018-03-07 DIAGNOSIS — Z720 Tobacco use: Secondary | ICD-10-CM

## 2018-03-07 DIAGNOSIS — K2100 Gastro-esophageal reflux disease with esophagitis, without bleeding: Secondary | ICD-10-CM

## 2018-03-07 DIAGNOSIS — E119 Type 2 diabetes mellitus without complications: Secondary | ICD-10-CM | POA: Diagnosis not present

## 2018-03-07 DIAGNOSIS — B182 Chronic viral hepatitis C: Secondary | ICD-10-CM

## 2018-03-07 DIAGNOSIS — E782 Mixed hyperlipidemia: Secondary | ICD-10-CM | POA: Diagnosis not present

## 2018-03-07 DIAGNOSIS — Z23 Encounter for immunization: Secondary | ICD-10-CM | POA: Diagnosis not present

## 2018-03-07 DIAGNOSIS — I10 Essential (primary) hypertension: Secondary | ICD-10-CM | POA: Diagnosis not present

## 2018-03-07 DIAGNOSIS — N3281 Overactive bladder: Secondary | ICD-10-CM | POA: Insufficient documentation

## 2018-03-07 DIAGNOSIS — Z5181 Encounter for therapeutic drug level monitoring: Secondary | ICD-10-CM

## 2018-03-07 DIAGNOSIS — F339 Major depressive disorder, recurrent, unspecified: Secondary | ICD-10-CM

## 2018-03-07 DIAGNOSIS — K22719 Barrett's esophagus with dysplasia, unspecified: Secondary | ICD-10-CM

## 2018-03-07 MED ORDER — VARENICLINE TARTRATE 0.5 MG X 11 & 1 MG X 42 PO MISC: ORAL | 0 refills | Status: AC

## 2018-03-07 NOTE — Progress Notes (Signed)
BP 116/76   Pulse 62   Temp 98.5 F (36.9 C) (Oral)   Ht 4\' 10"  (1.473 m)   Wt 163 lb 11.2 oz (74.3 kg)   SpO2 94%   BMI 34.21 kg/m    Subjective:    Patient ID: Tara Cochran, female    DOB: 07-Feb-1961, 57 y.o.   MRN: [id number]  HPI: AIRYANNA Cochran is a 57 y.o. female  Chief Complaint  Patient presents with  . Follow-up  . Medication Refill    naproxen given at hospital and helps back    HPI Hypertension: rx losartan 100mg  daily, rarely misses dose.  BP Readings from Last 3 Encounters:  03/07/18 116/76  02/03/18 130/76  02/02/18 122/78    Chronic bronchitis: Symbicort daily and albuterol as needed- states uses it maybe twice a day; Tobacco use- smokes less than 1/2 pack a day and has been cutting down and wants to work on quitting- wants to try chantix.   Hyperlipidemia: rx crestor 20mg  takes it during the day; no side effects  Lab Results  Component Value Date   CHOL 190 03/07/2018   HDL 62 03/07/2018   LDLCALC 92 03/07/2018   TRIG 274 (H) 03/07/2018   CHOLHDL 3.1 03/07/2018    Diabetes 2: Glipizide 5mg  daily, jardiance 10mg  ; took metformin in the past but had really bad headaches Lab Results  Component Value Date   HGBA1C 6.5 (H) 03/07/2018    Overactive bladder: has urinary urgency symptoms, rx myrbetriq- this helps sees urologist- budzyn missed last follow-up appointment but will call back to see about increasing dose.   Chronic Hep C, hx of gastritis and Barretts esophagus noted in problem list: Dr. Allen Norris- last seen years ago states just follows up with him as needed. States her insurance wont pay for the medicine he had prescribed, diarrhea has really slowed down. States she took medications for hepatitis C and thought she was cured from that. Last EGD was June 2016  HCV order from 2016 placed but listed as active.  Lab Results  Component Value Date   ALT 55 (H) 03/07/2018   AST 38 (H) 03/07/2018   ALKPHOS 137 (H) 02/22/2017   BILITOT  0.4 03/07/2018    Depresssion: paxil 37.5mg  daily; works well for her. Chronic, recurrent issue, major depression Depression screen [facility] 2/9 03/07/2018 11/22/2017 11/08/2017 08/25/2017 05/25/2017  Decreased Interest 0 0 0 0 0  Down, Depressed, Hopeless 0 0 0 0 0  PHQ - 2 Score 0 0 0 0 0  Altered sleeping 0 - 1 - -  Tired, decreased energy 0 - 1 - -  Change in appetite 0 - 1 - -  Feeling bad or failure about yourself  0 - 0 - -  Trouble concentrating 0 - 0 - -  Moving slowly or fidgety/restless 0 - 0 - -  Suicidal thoughts 0 - 0 - -  PHQ-9 Score 0 - 3 - -  Difficult doing work/chores - - Somewhat difficult - -   Ready to quit smoking; first cig of the day is 30-60 minutes after waking; last cig of the day is variable, 10-30 minutes; has never taken Chantix  Relevant past medical, surgical, family and social history reviewed Past Medical History:  Diagnosis Date  . Anemia    polycythemia  . Arthritis    hips/ knees/spine  . COPD (chronic obstructive pulmonary disease) ([place])   . Diabetes mellitus without complication ([place])   . GERD (gastroesophageal reflux disease)  food comes up when bends over  . Hepatitis    h/o tx x 3  . History of hiatal hernia   . History of kidney stones   . Hypertension   . Irritable behavior    gets mad easily  . OAB (overactive bladder)   . Polycythemia   . Renal disorder    Past Surgical History:  Procedure Laterality Date  . CESAREAN SECTION    . CHOLECYSTECTOMY    . COLONOSCOPY N/A 12/12/2014   Procedure: COLONOSCOPY;  Surgeon: Lucilla Lame, MD;  Location: [facility];  Service: Gastroenterology;  Laterality: N/A;  . CYSTOSCOPY W/ URETERAL STENT PLACEMENT Left 11/10/2016   Procedure: CYSTOSCOPY WITH STENT REPLACEMENT;  Surgeon: [name], MD;  Location: [facility];  Service: Urology;  Laterality: Left;  . CYSTOSCOPY WITH RETROGRADE PYELOGRAM, URETEROSCOPY AND STENT PLACEMENT Left 10/28/2016   Procedure: CYSTOSCOPY WITH attempted  RETROGRADE PYELOGRAM, URETEROSCOPY AND STENT PLACEMENT left;  Surgeon: [name], MD;  Location: [facility];  Service: Urology;  Laterality: Left;  . ESOPHAGOGASTRODUODENOSCOPY N/A 12/12/2014   Procedure: ESOPHAGOGASTRODUODENOSCOPY (EGD);  Surgeon: Lucilla Lame, MD;  Location: [facility];  Service: Gastroenterology;  Laterality: N/A;  . GALLBLADDER SURGERY    . POLYPECTOMY  12/12/2014   Procedure: POLYPECTOMY;  Surgeon: Lucilla Lame, MD;  Location: [place];  Service: Gastroenterology;;  . STONE EXTRACTION WITH BASKET Left 11/10/2016   Procedure: STONE EXTRACTION WITH BASKET;  Surgeon: [name], MD;  Location: [facility];  Service: Urology;  Laterality: Left;  . URETEROSCOPY Left 11/10/2016   Procedure: URETEROSCOPY;  Surgeon: [name], MD;  Location: [facility];  Service: Urology;  Laterality: Left;   Family History  Problem Relation Age of Onset  . Stroke Mother   . Hypertension Mother   . Hypertension Father   . Stroke Father   . Heart disease Sister    Social History   Tobacco Use  . Smoking status: Current Every Day Smoker    Packs/day: 0.50    Years: 40.00    Pack years: 20.00    Types: Cigarettes  . Smokeless tobacco: Never Used  Substance Use Topics  . Alcohol use: Not Currently  . Drug use: Yes    Frequency: 2.0 times per week    Types: Marijuana    Interim medical history since last visit reviewed. Allergies and medications reviewed  Review of Systems  Constitutional: Negative for chills, fatigue and fever.  Respiratory: Positive for cough. Negative for shortness of breath.   Cardiovascular: Negative for chest pain and palpitations.  Gastrointestinal: Negative for abdominal pain, blood in stool, constipation and diarrhea.  Genitourinary: Positive for frequency and urgency. Negative for dysuria and hematuria.  Skin: Negative for rash.  Neurological: Positive for headaches (occassional, resolved with food). Negative for  dizziness and numbness.  Psychiatric/Behavioral: Negative for dysphoric mood, sleep disturbance and suicidal ideas. The patient is not nervous/anxious.    Per HPI unless specifically indicated above     Objective:    BP 116/76   Pulse 62   Temp 98.5 F (36.9 C) (Oral)   Ht 4\' 10"  (1.473 m)   Wt 163 lb 11.2 oz (74.3 kg)   SpO2 94%   BMI 34.21 kg/m   Wt Readings from Last 3 Encounters:  03/07/18 163 lb 11.2 oz (74.3 kg)  02/03/18 167 lb 8.8 oz (76 kg)  02/02/18 167 lb 8.8 oz (76 kg)    Physical Exam  Constitutional: She appears well-developed and well-nourished.  No distress.  Obese, but weight down almost 4 pounds over the last month  HENT:  Head: Normocephalic and atraumatic.  Eyes: EOM are normal. No scleral icterus.  Neck: No thyromegaly present.  Cardiovascular: Normal rate, regular rhythm and normal heart sounds.  No murmur heard. Pulmonary/Chest: Effort normal and breath sounds normal. No respiratory distress. She has no wheezes.  Abdominal: Soft. Bowel sounds are normal. She exhibits no distension.  Musculoskeletal: She exhibits no edema.  Neurological: She is alert.  Skin: Skin is warm and dry. She is not diaphoretic. No pallor.  Psychiatric: She has a normal mood and affect. Her behavior is normal. Judgment and thought content normal.   Diabetic Foot Form - Detailed   Diabetic Foot Exam - detailed Diabetic Foot exam was performed with the following findings:  Yes 03/07/2018  5:42 AM  Visual Foot Exam completed.:  Yes  Pulse Foot Exam completed.:  Yes  Right Dorsalis Pedis:  Present Left Dorsalis Pedis:  Present  Sensory Foot Exam Completed.:  Yes Semmes-Weinstein Monofilament Test R Site 1-Great Toe:  Pos L Site 1-Great Toe:  Pos           Assessment & Plan:   Problem List Items Addressed This Visit      Cardiovascular and Mediastinum   Hypertension - Primary (Chronic)    Controlled today; continue meds, work on weight loss, DASH guidelines       Relevant Orders   COMPLETE METABOLIC PANEL WITH GFR (Completed)     Respiratory   Chronic bronchitis (HCC)     Digestive   Reflux esophagitis    Refer back to GI      Relevant Orders   Ambulatory referral to Gastroenterology   Chronic hepatitis C ([place])    Working with GI      Relevant Orders   Ambulatory referral to Gastroenterology   Barrett esophagus (Chronic)    Refer back to GI      Relevant Orders   Ambulatory referral to Gastroenterology     Endocrine   DM type 2 (diabetes mellitus, type 2) (HCC) (Chronic)    Foot exam by MD; weigh tloss, health eating, labs, eye exams discussed      Relevant Orders   HgB A1c (Completed)     Genitourinary   Overactive bladder     Other   Depression   Hyperlipidemia   Relevant Orders   Lipid Profile (Completed)   Tobacco abuse    Encouraged smoking cessation      Relevant Medications   varenicline (CHANTIX STARTING MONTH PAK) 0.5 MG X 11 & 1 MG X 42 tablet   Recurrent depressive disorder (HCC) (Chronic)    Chronic, recurrent; controlled; continue SSRI       Other Visit Diagnoses    Need for influenza vaccination       Relevant Orders   Flu Vaccine QUAD 6+ mos PF IM (Fluarix Quad PF) (Completed)   Medication monitoring encounter       Relevant Orders   COMPLETE METABOLIC PANEL WITH GFR (Completed)       Follow up plan: Return in about 3 months (around 06/06/2018) for follow-up visit with Dr. Sanda Klein.  An after-visit summary was printed and given to the patient at [facility].  Please see the patient instructions which may contain other information and recommendations beyond what is mentioned above in the assessment and plan.  Meds ordered this encounter  Medications  . varenicline (CHANTIX STARTING MONTH PAK) 0.5 MG X 11 &  1 MG X 42 tablet    Sig: Take one 0.5 mg tablet by mouth once daily for 3 days, then increase to one 0.5 mg tablet twice daily for 4 days, then increase to one 1 mg tablet twice daily.     Dispense:  53 tablet    Refill:  0    Order Specific Question:   Supervising Provider    AnswerArnetha Courser [[id number]]    Orders Placed This Encounter  Procedures  . Flu Vaccine QUAD 6+ mos PF IM (Fluarix Quad PF)  . Pneumococcal polysaccharide vaccine 23-valent greater than or equal to 2yo subcutaneous/IM  . COMPLETE METABOLIC PANEL WITH GFR  . Lipid Profile  . HgB A1c  . Ambulatory referral to Gastroenterology

## 2018-03-07 NOTE — Patient Instructions (Addendum)
- Please call to make an appointment with urology for your myrbetriq - Please call GI for routine follow-up  -Please do call to schedule your mammogram; the number to schedule one at either [facility] or [facility] is [phone]  You have received the Pneumovax vaccine (PPSV-23) and you will not need another booster of this until after you turn 56 years of age per current ACIP guidelines  I do encourage you to quit smoking Call [phone] to sign up for smoking cessation classes You can call 1-800-QUIT-NOW to talk with a smoking cessation coach   Steps to Quit Smoking Smoking tobacco can be bad for your health. It can also affect almost every organ in your body. Smoking puts you and people around you at risk for many serious long-lasting (chronic) diseases. Quitting smoking is hard, but it is one of the best things that you can do for your health. It is never too late to quit. What are the benefits of quitting smoking? When you quit smoking, you lower your risk for getting serious diseases and conditions. They can include:  Lung cancer or lung disease.  Heart disease.  Stroke.  Heart attack.  Not being able to have children (infertility).  Weak bones (osteoporosis) and broken bones (fractures).  If you have coughing, wheezing, and shortness of breath, those symptoms may get better when you quit. You may also get sick less often. If you are pregnant, quitting smoking can help to lower your chances of having a baby of low birth weight. What can I do to help me quit smoking? Talk with your doctor about what can help you quit smoking. Some things you can do (strategies) include:  Quitting smoking totally, instead of slowly cutting back how much you smoke over a period of time.  Going to in-person counseling. You are more likely to quit if you go to many counseling sessions.  Using resources and support systems, such as: ? Database administrator with a  Social worker. ? Phone quitlines. ? Careers information officer. ? Support groups or group counseling. ? Text messaging programs. ? Mobile phone apps or applications.  Taking medicines. Some of these medicines may have nicotine in them. If you are pregnant or breastfeeding, do not take any medicines to quit smoking unless your doctor says it is okay. Talk with your doctor about counseling or other things that can help you.  Talk with your doctor about using more than one strategy at the same time, such as taking medicines while you are also going to in-person counseling. This can help make quitting easier. What things can I do to make it easier to quit? Quitting smoking might feel very hard at first, but there is a lot that you can do to make it easier. Take these steps:  Talk to your family and friends. Ask them to support and encourage you.  Call phone quitlines, reach out to support groups, or work with a Social worker.  Ask people who smoke to not smoke around you.  Avoid places that make you want (trigger) to smoke, such as: ? Bars. ? Parties. ? Smoke-break areas at work.  Spend time with people who do not smoke.  Lower the stress in your life. Stress can make you want to smoke. Try these things to help your stress: ? Getting regular exercise. ? Deep-breathing exercises. ? Yoga. ? Meditating. ? Doing a body scan. To do this, close your eyes, focus on one area of your body at a  time from head to toe, and notice which parts of your body are tense. Try to relax the muscles in those areas.  Download or buy apps on your mobile phone or tablet that can help you stick to your quit plan. There are many free apps, such as QuitGuide from the [employer] Office manager for Disease Control and Prevention). You can find more support from smokefree.gov and other websites.  This information is not intended to replace advice given to you by your health care provider. Make sure you discuss any questions you have  with your health care provider. Document Released: 04/10/2009 Document Revised: 02/10/2016 Document Reviewed: 10/29/2014 Elsevier Interactive Patient Education  2018 [employer].

## 2018-03-08 LAB — COMPLETE METABOLIC PANEL WITH GFR
AG Ratio: 1.3 (calc) (ref 1.0–2.5)
ALBUMIN MSPROF: 3.8 g/dL (ref 3.6–5.1)
ALT: 55 U/L — ABNORMAL HIGH (ref 6–29)
AST: 38 U/L — AB (ref 10–35)
Alkaline phosphatase (APISO): 135 U/L — ABNORMAL HIGH (ref 33–130)
BUN: 17 mg/dL (ref 7–25)
CO2: 28 mmol/L (ref 20–32)
Calcium: 9.5 mg/dL (ref 8.6–10.4)
Chloride: 104 mmol/L (ref 98–110)
Creat: 0.71 mg/dL (ref 0.50–1.05)
GFR, EST NON AFRICAN AMERICAN: 95 mL/min/{1.73_m2} (ref >=60)
GFR, Est African American: 110 mL/min/{1.73_m2} (ref >=60)
GLOBULIN: 2.9 g/dL (ref 1.9–3.7)
GLUCOSE: 164 mg/dL — AB (ref 65–99)
Potassium: 4.2 mmol/L (ref 3.5–5.3)
SODIUM: 142 mmol/L (ref 135–146)
Total Bilirubin: 0.4 mg/dL (ref 0.2–1.2)
Total Protein: 6.7 g/dL (ref 6.1–8.1)

## 2018-03-08 LAB — LIPID PANEL
CHOL/HDL RATIO: 3.1 (calc) (ref <5.0)
Cholesterol: 190 mg/dL (ref <200)
HDL: 62 mg/dL (ref >50)
LDL Cholesterol (Calc): 92 mg/dL (calc)
Non-HDL Cholesterol (Calc): 128 mg/dL (calc) (ref <130)
Triglycerides: 274 mg/dL — ABNORMAL HIGH (ref <150)

## 2018-03-08 LAB — HEMOGLOBIN A1C
Hgb A1c MFr Bld: 6.5 % of total Hgb — ABNORMAL HIGH (ref <5.7)
MEAN PLASMA GLUCOSE: 140 (calc)
eAG (mmol/L): 7.7 (calc)

## 2018-03-10 ENCOUNTER — Telehealth: Payer: Self-pay

## 2018-03-10 MED ORDER — NAPROXEN 500 MG PO TABS
500.0000 mg | ORAL_TABLET | Freq: Two times a day (BID) | ORAL | 0 refills | Status: DC
Start: 2018-03-10 — End: 2018-03-27

## 2018-03-10 NOTE — Telephone Encounter (Signed)
Very limited amount sent I called patient; left message on identified voicemail Be aware of GI bleed risk; if any stomach pain or blood in stool, seek help right away

## 2018-03-10 NOTE — Telephone Encounter (Signed)
Copied from [place] [id number]. Topic: General - Other >> Mar 09, 2018  4:53 PM Mcneil, Ja-Kwan wrote: Reason for CRM: Pt states she was told that a Rx for naproxen (NAPROSYN) 500 MG tablet would be sent to her pharmacy but they never received the request. Pt requests a call back.

## 2018-03-15 ENCOUNTER — Encounter: Payer: Self-pay | Admitting: *Deleted

## 2018-03-20 ENCOUNTER — Encounter: Payer: Self-pay | Admitting: Family Medicine

## 2018-03-20 DIAGNOSIS — F339 Major depressive disorder, recurrent, unspecified: Secondary | ICD-10-CM | POA: Insufficient documentation

## 2018-03-20 DIAGNOSIS — Z23 Encounter for immunization: Secondary | ICD-10-CM | POA: Diagnosis not present

## 2018-03-20 NOTE — Assessment & Plan Note (Signed)
Refer back to GI. 

## 2018-03-20 NOTE — Assessment & Plan Note (Signed)
Working with GI 

## 2018-03-20 NOTE — Assessment & Plan Note (Signed)
Foot exam by MD; weigh tloss, health eating, labs, eye exams discussed

## 2018-03-20 NOTE — Addendum Note (Signed)
Addended by: Cathrine Muster C on: 03/20/2018 11:40 AM   Modules accepted: Orders

## 2018-03-20 NOTE — Assessment & Plan Note (Signed)
Controlled today; continue meds, work on weight loss, DASH guidelines

## 2018-03-20 NOTE — Assessment & Plan Note (Signed)
Chronic, recurrent; controlled; continue SSRI

## 2018-03-20 NOTE — Assessment & Plan Note (Signed)
Encouraged smoking cessation 

## 2018-03-27 ENCOUNTER — Other Ambulatory Visit: Payer: Self-pay | Admitting: Family Medicine

## 2018-03-27 NOTE — Telephone Encounter (Signed)
Pt.notified

## 2018-03-27 NOTE — Telephone Encounter (Signed)
I received another refill request for 500 mg naproxen I'm going to decline this Long-term use of NSAIDs can lead to kidney disease It can also increase risk of bleeding We'll encourage plain tylenol per package directions

## 2018-03-27 NOTE — Telephone Encounter (Signed)
Copied from [place] [id number]. Topic: Quick Communication - Rx Refill/Question >> Mar 27, 2018 11:34 AM Robina Ade, Helene Kelp D wrote: Medication:naproxen (NAPROSYN) 500 MG tablet  Has the patient contacted their pharmacy? Yes (Agent: If no, request that the patient contact the pharmacy for the refill.) (Agent: If yes, when and what did the pharmacy advise?)  Preferred Pharmacy (with phone number or street name):[facility] [place], [place]: Please be advised that RX refills may take up to 3 business days. We ask that you follow-up with your pharmacy.

## 2018-03-30 ENCOUNTER — Other Ambulatory Visit: Payer: Self-pay | Admitting: Family Medicine

## 2018-03-30 DIAGNOSIS — I10 Essential (primary) hypertension: Secondary | ICD-10-CM

## 2018-04-02 NOTE — Telephone Encounter (Signed)
Reviewed last K+ and Cr; Rx approved 

## 2018-05-23 ENCOUNTER — Ambulatory Visit: Payer: Medicare HMO | Admitting: Gastroenterology

## 2018-06-06 ENCOUNTER — Ambulatory Visit: Payer: Medicare HMO | Admitting: Family Medicine

## 2018-08-30 IMAGING — CT CT ABD-PELV W/ CM
2 of 5 series · 15 of 46 positions shown, 17 images · IV contrast (APPLIED)
Comparison: April 23, 2014

CLINICAL DATA: Left-sided abdominal/flank pain

EXAM:
CT ABDOMEN AND PELVIS WITH CONTRAST
TECHNIQUE: Multidetector CT imaging of the abdomen and pelvis was performed
using the standard protocol following bolus administration of
intravenous contrast. Oral contrast was also administered.
CONTRAST:  100mL UBMTGE-B22 IOPAMIDOL (UBMTGE-B22) INJECTION 61%

[Series 2: routine abd/pel with · axial · 0.90mm/px · z∈[-725,-325]mm · 12 of 90 slices shown, 14 images]
[im 5/90  soft-tissue]
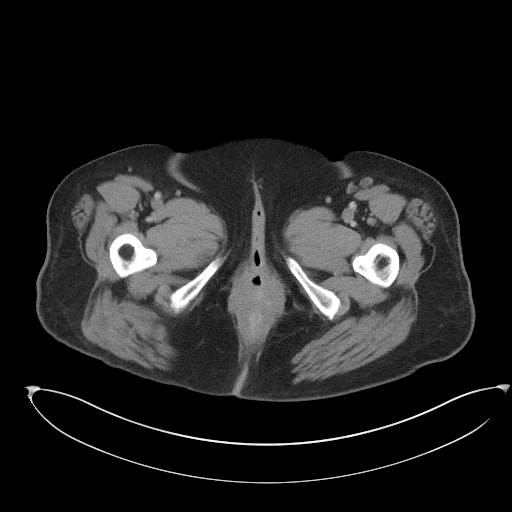
[im 5/90  bone]
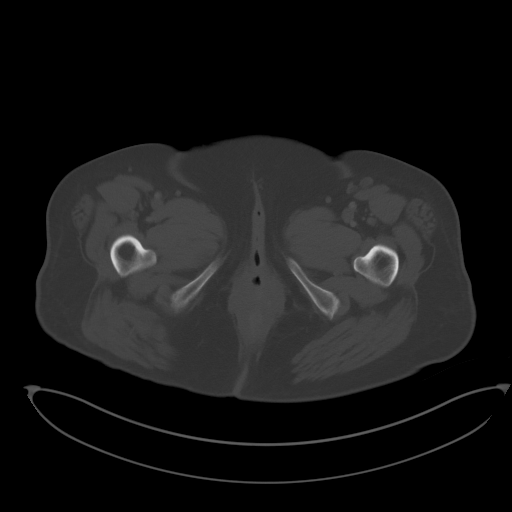
[im 15/90  soft-tissue]
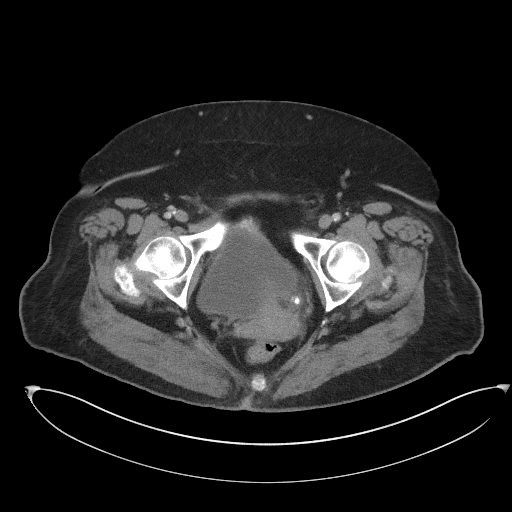
[im 19/90  soft-tissue]
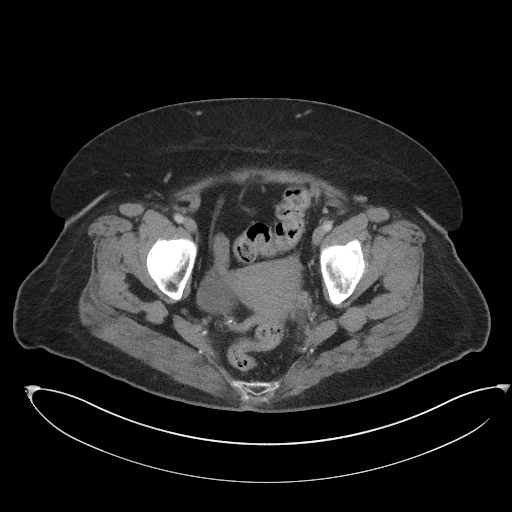
[im 29/90  soft-tissue]
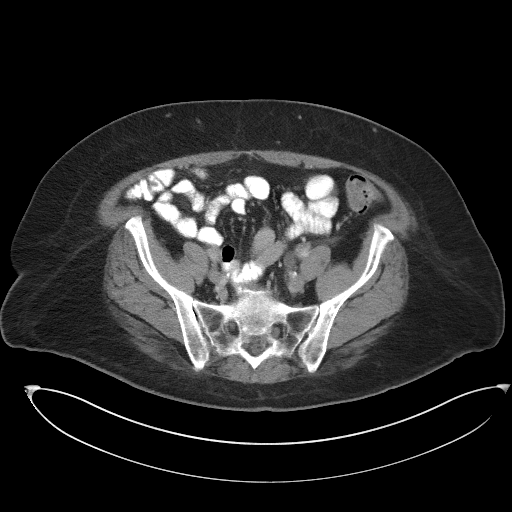
[im 33/90  soft-tissue]
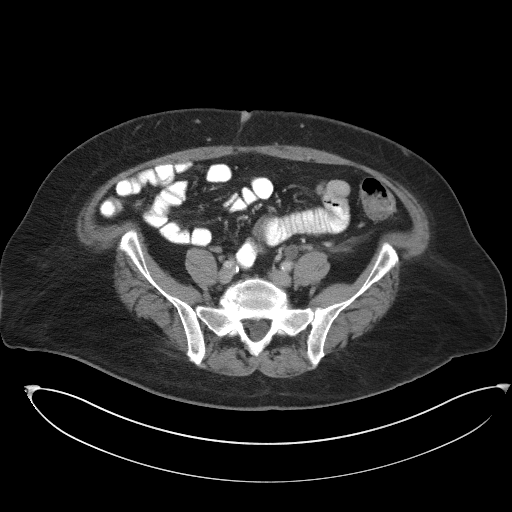
[im 43/90  soft-tissue]
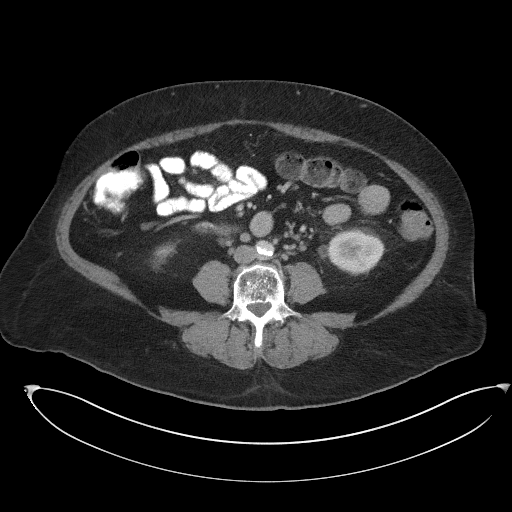
[im 47/90  soft-tissue]
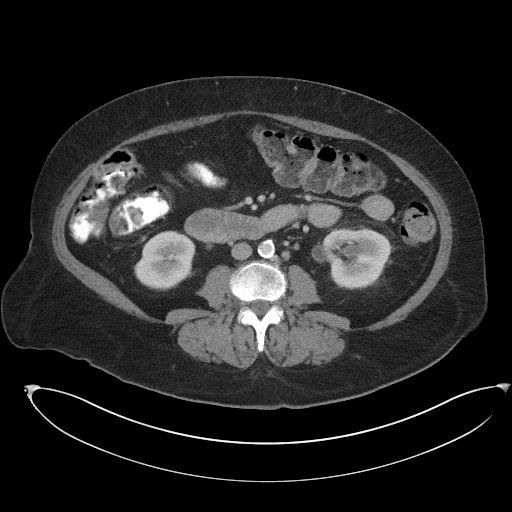
[im 57/90  soft-tissue]
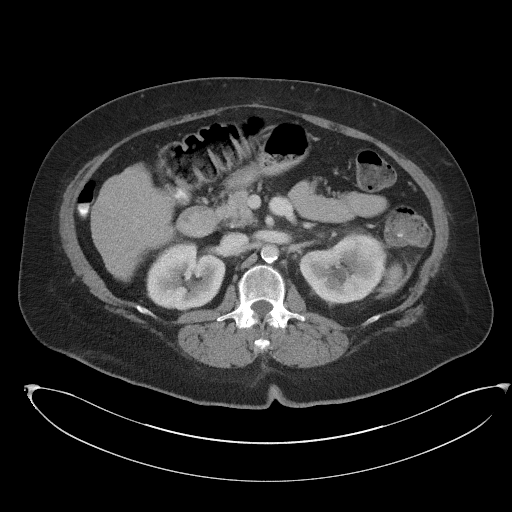
[im 61/90  soft-tissue]
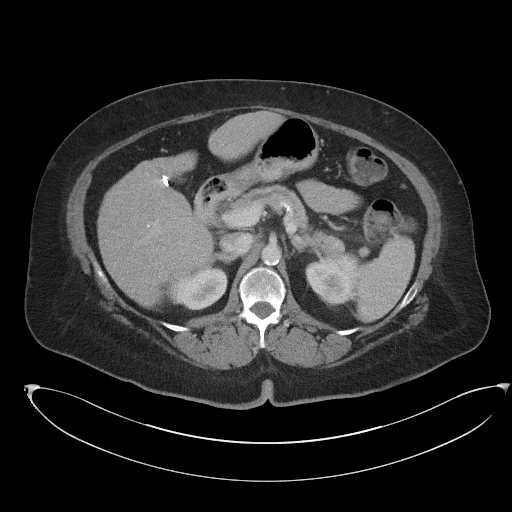
[im 61/90  bone]
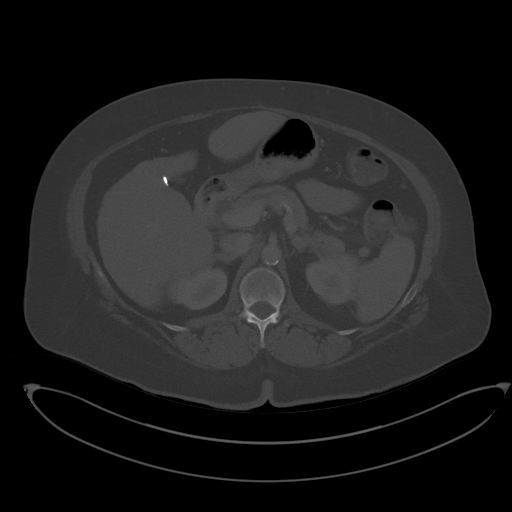
[im 71/90  soft-tissue]
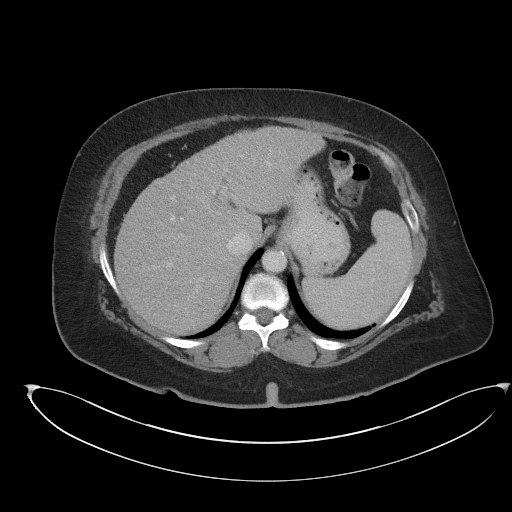
[im 75/90  soft-tissue]
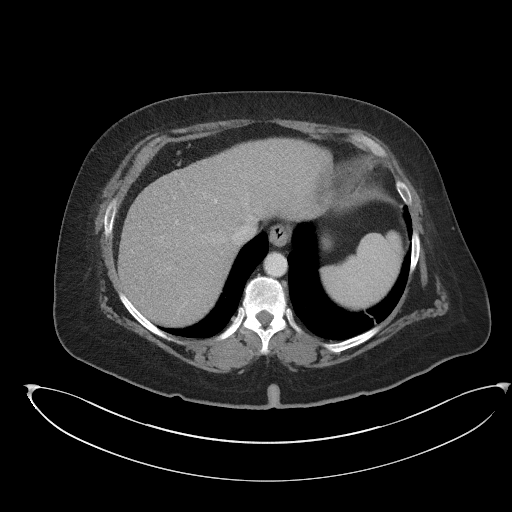
[im 85/90  soft-tissue]
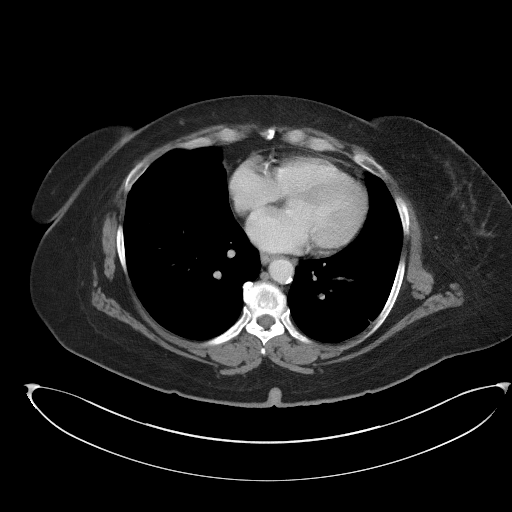

[Series 5: coronal st · coronal · 0.76mm/px · 3 of 100 slices shown]
[im 34/100  soft-tissue]
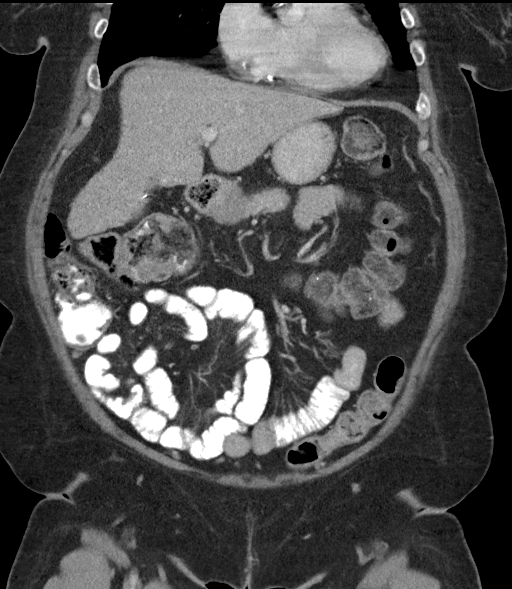
[im 45/100  soft-tissue]
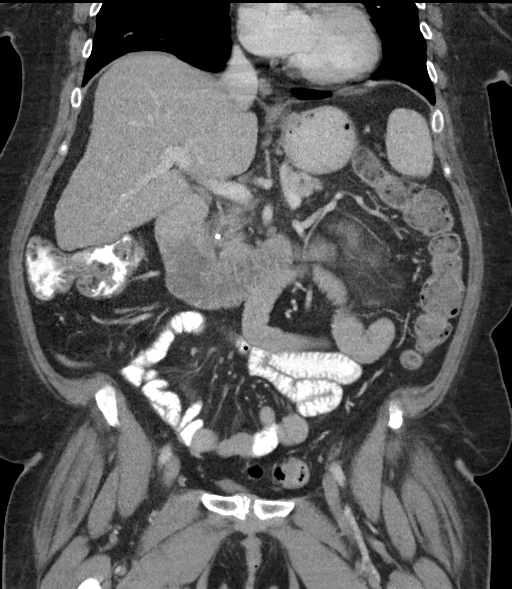
[im 56/100  soft-tissue]
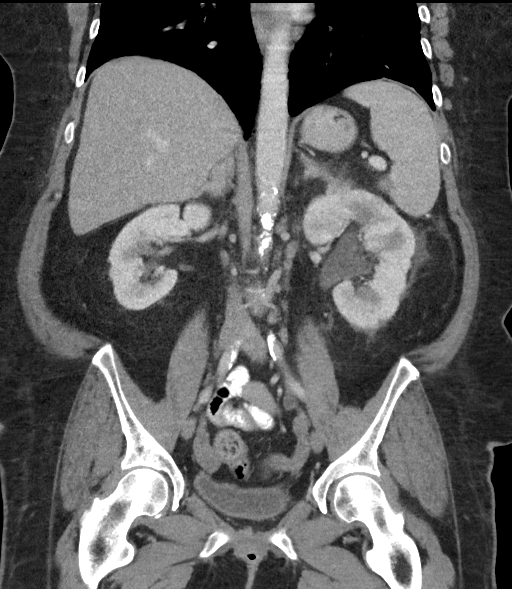

[15 of 46 positions shown; findings below may reference images not displayed]

FINDINGS: Lower chest: There is scarring in the left lower lung zone. There
are foci of coronary artery calcification.

Hepatobiliary: The liver is again noted to have a subtly nodular
contour, concerning for underlying hepatic cirrhosis. No focal liver
lesions are apparent. Gallbladder is absent. There is no appreciable
biliary duct dilatation.

Pancreas: No pancreatic mass or inflammatory focus. Pancreatic duct
is borderline prominent stable compared to the 6077 study.

Spleen:  No splenic lesions are evident.

Adrenals/Urinary Tract: Adrenals appear unremarkable bilaterally.
There is no renal mass on either side. Left kidney is subtly
edematous with left-sided perinephric stranding. There is no
hydronephrosis the right. There is moderate hydronephrosis on the
left.

There is no intrarenal calculus on either side. On the left, there
are 2 immediately adjacent calculi just proximal to the left
ureterovesical junction. In this area, there is a calculus measuring
5 x 4 mm within immediately adjacent 4 x 4 mm calculus. No other
ureteral calculi are identified on either side. Urinary bladder is
midline with wall thickness within normal limits.

Stomach/Bowel: There are scattered left-sided colonic diverticula
without diverticulitis. There is no bowel wall or mesenteric
thickening. No bowel obstruction. No free air or portal venous air.

Vascular/Lymphatic: There are foci of atherosclerotic calcification
in the aorta and iliac arteries. No evident abdominal aortic
aneurysm. Subcentimeter retroperitoneal lymph nodes are stable
compared to the prior study from 6077. There is no frank adenopathy
by size criteria in the abdomen or pelvis.

Reproductive: Uterus is anteverted. There is no appreciable pelvic
mass.

Other: Appendix appears normal. There is no ascites or abscess in
the abdomen or pelvis. There is a rather minimal ventral hernia
containing only fat.

Musculoskeletal: There is a hemangioma in the L4 vertebral body. No
blastic or lytic bone lesions are evident. There is no intramuscular
or abdominal wall lesion.
IMPRESSION: There are two adjacent distal ureteral calculi on the left with
moderate hydronephrosis in ureterectasis on the left. The distal
ureteral calculi on the left measure 5 x 4 mm and 4 x 4 mm
respectively. The left kidney shows subtle edema with perinephric
stranding.

The contour of the liver raises question of underlying cirrhosis. No
focal liver lesions are evident. Gallbladder absent.

Appendix appears normal.  No abscess.  No bowel obstruction.

Rather minimal ventral hernia containing fat.

There is aortoiliac atherosclerosis. There are foci of coronary
artery calcification.

There is a stable hemangioma in the L4 vertebral body, a benign
finding.

These results will be called to the ordering clinician or
representative by the Radiologist Assistant, and communication
documented in the PACS or zVision Dashboard.

## 2018-10-05 ENCOUNTER — Ambulatory Visit (INDEPENDENT_AMBULATORY_CARE_PROVIDER_SITE_OTHER): Payer: Self-pay | Admitting: Vascular Surgery

## 2018-10-05 ENCOUNTER — Encounter (INDEPENDENT_AMBULATORY_CARE_PROVIDER_SITE_OTHER): Payer: Self-pay

## 2022-11-27 DEATH — deceased
